# Patient Record
Sex: Male | Born: 1965 | Race: White | Hispanic: No | Marital: Single | Smoking: Current some day smoker
Health system: Southern US, Community
[De-identification: ages and names within clinical notes are randomized; demographics above are authoritative.]

## PROBLEM LIST (undated history)

## (undated) DIAGNOSIS — M5020 Other cervical disc displacement, unspecified cervical region: Secondary | ICD-10-CM

## (undated) DIAGNOSIS — R569 Unspecified convulsions: Secondary | ICD-10-CM

## (undated) DIAGNOSIS — I219 Acute myocardial infarction, unspecified: Secondary | ICD-10-CM

## (undated) DIAGNOSIS — M542 Cervicalgia: Secondary | ICD-10-CM

## (undated) DIAGNOSIS — J449 Chronic obstructive pulmonary disease, unspecified: Secondary | ICD-10-CM

## (undated) DIAGNOSIS — H9191 Unspecified hearing loss, right ear: Secondary | ICD-10-CM

## (undated) DIAGNOSIS — S0993XA Unspecified injury of face, initial encounter: Secondary | ICD-10-CM

## (undated) DIAGNOSIS — M1990 Unspecified osteoarthritis, unspecified site: Secondary | ICD-10-CM

## (undated) DIAGNOSIS — G8929 Other chronic pain: Secondary | ICD-10-CM

## (undated) DIAGNOSIS — F99 Mental disorder, not otherwise specified: Secondary | ICD-10-CM

## (undated) DIAGNOSIS — I639 Cerebral infarction, unspecified: Secondary | ICD-10-CM

## (undated) DIAGNOSIS — M75100 Unspecified rotator cuff tear or rupture of unspecified shoulder, not specified as traumatic: Secondary | ICD-10-CM

## (undated) DIAGNOSIS — I10 Essential (primary) hypertension: Secondary | ICD-10-CM

---

## 2001-08-15 ENCOUNTER — Emergency Department (HOSPITAL_COMMUNITY): Admission: EM | Admit: 2001-08-15 | Discharge: 2001-08-15 | Payer: Self-pay | Admitting: Emergency Medicine

## 2001-08-15 ENCOUNTER — Encounter: Payer: Self-pay | Admitting: Internal Medicine

## 2001-11-11 ENCOUNTER — Encounter: Payer: Self-pay | Admitting: Emergency Medicine

## 2001-11-11 ENCOUNTER — Emergency Department (HOSPITAL_COMMUNITY): Admission: EM | Admit: 2001-11-11 | Discharge: 2001-11-11 | Payer: Self-pay | Admitting: Emergency Medicine

## 2002-11-08 ENCOUNTER — Emergency Department (HOSPITAL_COMMUNITY): Admission: EM | Admit: 2002-11-08 | Discharge: 2002-11-08 | Payer: Self-pay | Admitting: Emergency Medicine

## 2003-11-28 ENCOUNTER — Emergency Department (HOSPITAL_COMMUNITY): Admission: EM | Admit: 2003-11-28 | Discharge: 2003-11-28 | Payer: Self-pay | Admitting: Emergency Medicine

## 2003-12-01 ENCOUNTER — Observation Stay (HOSPITAL_COMMUNITY): Admission: EM | Admit: 2003-12-01 | Discharge: 2003-12-01 | Payer: Self-pay | Admitting: Emergency Medicine

## 2007-10-16 ENCOUNTER — Emergency Department (HOSPITAL_COMMUNITY): Admission: EM | Admit: 2007-10-16 | Discharge: 2007-10-16 | Payer: Self-pay | Admitting: Emergency Medicine

## 2008-10-14 ENCOUNTER — Emergency Department (HOSPITAL_COMMUNITY): Admission: EM | Admit: 2008-10-14 | Discharge: 2008-10-14 | Payer: Self-pay | Admitting: Emergency Medicine

## 2009-02-10 ENCOUNTER — Emergency Department (HOSPITAL_COMMUNITY): Admission: EM | Admit: 2009-02-10 | Discharge: 2009-02-10 | Payer: Self-pay | Admitting: Emergency Medicine

## 2009-05-05 ENCOUNTER — Emergency Department (HOSPITAL_COMMUNITY): Admission: EM | Admit: 2009-05-05 | Discharge: 2009-05-05 | Payer: Self-pay | Admitting: Emergency Medicine

## 2009-05-10 ENCOUNTER — Emergency Department (HOSPITAL_COMMUNITY): Admission: EM | Admit: 2009-05-10 | Discharge: 2009-05-10 | Payer: Self-pay | Admitting: Emergency Medicine

## 2009-10-20 ENCOUNTER — Emergency Department (HOSPITAL_COMMUNITY): Admission: EM | Admit: 2009-10-20 | Discharge: 2009-10-20 | Payer: Self-pay | Admitting: Emergency Medicine

## 2009-12-02 ENCOUNTER — Ambulatory Visit (HOSPITAL_COMMUNITY): Admission: RE | Admit: 2009-12-02 | Discharge: 2009-12-02 | Payer: Self-pay | Admitting: Pulmonary Disease

## 2010-01-26 ENCOUNTER — Ambulatory Visit (HOSPITAL_COMMUNITY)
Admission: RE | Admit: 2010-01-26 | Discharge: 2010-01-26 | Payer: Self-pay | Source: Home / Self Care | Attending: Pulmonary Disease | Admitting: Pulmonary Disease

## 2010-02-04 ENCOUNTER — Emergency Department (HOSPITAL_COMMUNITY)
Admission: EM | Admit: 2010-02-04 | Discharge: 2010-02-04 | Payer: Self-pay | Source: Home / Self Care | Admitting: Emergency Medicine

## 2010-02-04 ENCOUNTER — Inpatient Hospital Stay (HOSPITAL_COMMUNITY): Admission: EM | Admit: 2010-02-04 | Discharge: 2010-02-05 | Payer: Self-pay | Source: Home / Self Care

## 2010-02-06 ENCOUNTER — Emergency Department (HOSPITAL_COMMUNITY)
Admission: EM | Admit: 2010-02-06 | Discharge: 2010-02-06 | Payer: Self-pay | Source: Home / Self Care | Admitting: Emergency Medicine

## 2010-03-05 ENCOUNTER — Encounter: Payer: Self-pay | Admitting: Pulmonary Disease

## 2010-03-06 ENCOUNTER — Encounter: Payer: Self-pay | Admitting: Pulmonary Disease

## 2010-04-24 LAB — URINALYSIS, ROUTINE W REFLEX MICROSCOPIC
Bilirubin Urine: NEGATIVE
Glucose, UA: NEGATIVE mg/dL
Ketones, ur: NEGATIVE mg/dL
Leukocytes, UA: NEGATIVE
Nitrite: NEGATIVE
Protein, ur: NEGATIVE mg/dL
Specific Gravity, Urine: 1.01 (ref 1.005–1.030)
Urobilinogen, UA: 0.2 mg/dL (ref 0.0–1.0)
pH: 5.5 (ref 5.0–8.0)

## 2010-04-24 LAB — RAPID URINE DRUG SCREEN, HOSP PERFORMED
Amphetamines: NOT DETECTED
Barbiturates: NOT DETECTED
Benzodiazepines: NOT DETECTED
Cocaine: NOT DETECTED
Opiates: NOT DETECTED
Tetrahydrocannabinol: NOT DETECTED

## 2010-04-24 LAB — BASIC METABOLIC PANEL
CO2: 23 mEq/L (ref 19–32)
Calcium: 8.5 mg/dL (ref 8.4–10.5)
Chloride: 104 mEq/L (ref 96–112)
GFR calc Af Amer: >60 mL/min (ref >60)
GFR calc Af Amer: >60 mL/min (ref >60)
GFR calc non Af Amer: >60 mL/min (ref >60)
GFR calc non Af Amer: >60 mL/min (ref >60)
Potassium: 4.1 mEq/L (ref 3.5–5.1)
Sodium: 141 mEq/L (ref 135–145)

## 2010-04-24 LAB — DIFFERENTIAL
Lymphocytes Relative: 24 % (ref 12–46)
Lymphs Abs: 2.5 10*3/uL (ref 0.7–4.0)
Neutro Abs: 7.9 10*3/uL — ABNORMAL HIGH (ref 1.7–7.7)
Neutrophils Relative %: 73 % (ref 43–77)

## 2010-04-24 LAB — CBC
HCT: 40.1 % (ref 39.0–52.0)
Hemoglobin: 15.3 g/dL (ref 13.0–17.0)
MCV: 90.9 fL (ref 78.0–100.0)
Platelets: 236 10*3/uL (ref 150–400)
RBC: 4.69 MIL/uL (ref 4.22–5.81)
RBC: 4.41 MIL/uL (ref 4.22–5.81)
WBC: 10.8 10*3/uL — ABNORMAL HIGH (ref 4.0–10.5)
WBC: 9.4 10*3/uL (ref 4.0–10.5)

## 2010-04-24 LAB — URINE MICROSCOPIC-ADD ON

## 2010-04-24 LAB — CEA: CEA: 1.1 ng/mL (ref 0.0–5.0)

## 2010-04-24 LAB — MRSA PCR SCREENING: MRSA by PCR: NEGATIVE

## 2010-05-15 LAB — CK TOTAL AND CKMB (NOT AT ARMC)
CK, MB: 0.9 ng/mL (ref 0.3–4.0)
Relative Index: INVALID (ref 0.0–2.5)
Total CK: 85 U/L (ref 7–232)

## 2010-05-15 LAB — DIFFERENTIAL
Eosinophils Absolute: 0.1 10*3/uL (ref 0.0–0.7)
Eosinophils Relative: 1 % (ref 0–5)
Lymphs Abs: 2.9 10*3/uL (ref 0.7–4.0)
Monocytes Absolute: 0.8 10*3/uL (ref 0.1–1.0)

## 2010-05-15 LAB — CBC
HCT: 48.3 % (ref 39.0–52.0)
Hemoglobin: 16.3 g/dL (ref 13.0–17.0)
MCV: 93.8 fL (ref 78.0–100.0)
Platelets: 219 10*3/uL (ref 150–400)
WBC: 9.6 10*3/uL (ref 4.0–10.5)

## 2010-05-15 LAB — TROPONIN I: Troponin I: 0.03 ng/mL (ref 0.00–0.06)

## 2010-05-15 LAB — BASIC METABOLIC PANEL
BUN: 9 mg/dL (ref 6–23)
Chloride: 103 mEq/L (ref 96–112)
Glucose, Bld: 137 mg/dL — ABNORMAL HIGH (ref 70–99)
Potassium: 4.1 mEq/L (ref 3.5–5.1)
Sodium: 135 mEq/L (ref 135–145)

## 2010-05-15 LAB — POCT CARDIAC MARKERS: Troponin i, poc: <0.05 ng/mL (ref 0.00–0.09)

## 2010-06-30 NOTE — H&P (Signed)
NAME:  [name], [name]                ACCOUNT NO.:  [id number]   MEDICAL RECORD NO.:  [id number]          PATIENT TYPE:  INP   LOCATION:  A220                          FACILITY:  [facility]   PHYSICIAN:  Kirk Ruths, M.D.DATE OF BIRTH:  1965-02-20   DATE OF ADMISSION:  12/01/2003  DATE OF DISCHARGE:  LH                                HISTORY & PHYSICAL   CHIEF COMPLAINT:  Syncope, chest pain.   HISTORY OF PRESENT ILLNESS:  This is a 45 year old male who was in the  parking lot at work today when he had a syncopal episode.  It seemed to last  a few seconds.  Patient states that he awoke and went on to work.  At work,  another syncopal episode.  Coworkers helped him lay on the floor.  This  lasted a few seconds.  Patient stated he did have some severe chest pain,  which he describes as sharp in pressure and tight.  Patient was treated with  nitroglycerin by EMS.  He had gradual relief of his chest pain.  The patient  has never had any previous syncopal spells before.  He does relate that he  occasionally has chest pain.  These are usually associated with stress and  anxiety.  Patient incidentally was seen in the emergency room this past  weekend and treated for kidney stones.  He states that he is having some  urethral discomfort and bladder discomfort related to his kidney stone but  has not taken any pain medications today.  The patient in the emergency room  was found to have a normal CT of the brain, normal EKG.  Cardiac enzymes  were negative.  CBC and MET-7 was significant only for potassium of 3.2.  Patient incidentally does relate some diaphoresis and feeling hot off and on  during the day.  The patient did not lose control of his bodily functions  during these episodes.  In the emergency room, when he was transferring to  the bed, he had a quasi-syncopal spell, described by one of the nurses as  eyes twittering.  The patient was admitted for telemetry to rule out MI and  a  neurological consult.   PAST MEDICAL HISTORY:  Patient has been here in the hospital once before for  a back injury several years ago.   He takes no medications regularly.   REVIEW OF SYSTEMS:  Patient relates some ongoing indigestion-type problems.  He states that he occasionally vomits in the mornings.  Patient has a  history of hyperlipidemia, untreated.   SOCIAL HISTORY:  He is a heavy smoker and drinks occasional beer.  He denies  substance abuse.   FAMILY HISTORY:  Significant for coronary artery disease with father having  a CABG.   PHYSICAL EXAMINATION:  VITAL SIGNS:  Afebrile.  Blood pressure 130/70, pulse  88 and regular, respirations 20 and unlabored.  GENERAL:  A well-developed white male in no severe distress.  HEENT:  TM's are normal.  Pupils are equal, round and reactive to light and  accommodation.  Oropharynx benign.  NECK:  Supple without  JVD, bruit, or thyromegaly.  LUNGS:  Clear in all areas.  HEART:  Regular sinus rhythm without murmur, rub or gallop.  ABDOMEN:  Soft and nontender.  EXTREMITIES:  Without clubbing, cyanosis or edema.  NEUROLOGIC:  Grossly intact.   ASSESSMENT:  1.  A syncopal episode.  2.  Chest pain.   PLAN:  Rule out MI, telemetry.  Neurological consult.     Will   WMM/MEDQ  D:  12/01/2003  T:  12/01/2003  Job:  [id number]

## 2010-06-30 NOTE — Discharge Summary (Signed)
NAME:  RIGLEY, NIESS                ACCOUNT NO.:  [id number]   MEDICAL RECORD NO.:  [id number]          PATIENT TYPE:  INP   LOCATION:  A220                          FACILITY:  [facility]   PHYSICIAN:  Kirk Ruths, M.D.DATE OF BIRTH:  1965/03/07   DATE OF ADMISSION:  12/01/2003  DATE OF DISCHARGE:  10/19/2005LH                                 DISCHARGE SUMMARY   DISCHARGE DIAGNOSES:  1.  Syncope.  2.  Chest pain.   HOSPITAL COURSE:  Refer to H&P.  The patient is a 45 year old who had a  syncopal episode at work today, less than a few seconds, and another  syncopal episode after inside work.  He was brought to the emergency room  where he also reported that he had some chest pain, sharp with pressure and  tight.  Responded to nitroglycerin.  The patient's workup in the emergency  room included CT scan of the brain, EKG, cardiac enzymes.  CBC and MET-7  were normal except potassium slightly low at 3.2.   The patient was admitted for rule out MI as well neurological evaluation for  syncope.  He was seen by me at which time we spoke of the gravity of  potential problems, it was inadvisable for him to be away off of the cardiac  monitor and ordered a nicotine patch. Shortly thereafter, the patient signed  out AMA.     Will   WMM/MEDQ  D:  12/21/2003  T:  12/21/2003  Job:  [id number]

## 2010-10-17 DIAGNOSIS — F1010 Alcohol abuse, uncomplicated: Secondary | ICD-10-CM | POA: Insufficient documentation

## 2010-10-17 DIAGNOSIS — F17200 Nicotine dependence, unspecified, uncomplicated: Secondary | ICD-10-CM | POA: Insufficient documentation

## 2010-10-17 DIAGNOSIS — J45909 Unspecified asthma, uncomplicated: Secondary | ICD-10-CM | POA: Insufficient documentation

## 2010-10-17 DIAGNOSIS — S060X0A Concussion without loss of consciousness, initial encounter: Secondary | ICD-10-CM | POA: Insufficient documentation

## 2010-10-17 DIAGNOSIS — S139XXA Sprain of joints and ligaments of unspecified parts of neck, initial encounter: Secondary | ICD-10-CM | POA: Insufficient documentation

## 2010-10-17 DIAGNOSIS — W010XXA Fall on same level from slipping, tripping and stumbling without subsequent striking against object, initial encounter: Secondary | ICD-10-CM | POA: Insufficient documentation

## 2010-10-17 DIAGNOSIS — R209 Unspecified disturbances of skin sensation: Secondary | ICD-10-CM | POA: Insufficient documentation

## 2010-10-17 DIAGNOSIS — IMO0002 Reserved for concepts with insufficient information to code with codable children: Secondary | ICD-10-CM | POA: Insufficient documentation

## 2010-10-17 NOTE — ED Notes (Signed)
Pt was out w. [employer], +etoh, became dizzy , tripped and fell, awoke some time later, now having neck pain, abrasion to chin.  +loc, unsure how long he was "out"

## 2010-10-18 ENCOUNTER — Emergency Department (HOSPITAL_COMMUNITY): Payer: Self-pay

## 2010-10-18 ENCOUNTER — Emergency Department (HOSPITAL_COMMUNITY)
Admission: EM | Admit: 2010-10-18 | Discharge: 2010-10-18 | Disposition: A | Discharge status: Home / Self Care | Payer: Self-pay | Attending: Emergency Medicine | Admitting: Emergency Medicine

## 2010-10-18 ENCOUNTER — Other Ambulatory Visit: Payer: Self-pay

## 2010-10-18 DIAGNOSIS — S060X9A Concussion with loss of consciousness of unspecified duration, initial encounter: Secondary | ICD-10-CM

## 2010-10-18 DIAGNOSIS — S161XXA Strain of muscle, fascia and tendon at neck level, initial encounter: Secondary | ICD-10-CM

## 2010-10-18 HISTORY — DX: Other cervical disc displacement, unspecified cervical region: M50.20

## 2010-10-18 HISTORY — DX: Mental disorder, not otherwise specified: F99

## 2010-10-18 HISTORY — DX: Cervicalgia: M54.2

## 2010-10-18 HISTORY — DX: Other chronic pain: G89.29

## 2010-10-18 HISTORY — DX: Unspecified injury of face, initial encounter: S09.93XA

## 2010-10-18 HISTORY — DX: Unspecified osteoarthritis, unspecified site: M19.90

## 2010-10-18 HISTORY — DX: Unspecified rotator cuff tear or rupture of unspecified shoulder, not specified as traumatic: M75.100

## 2010-10-18 LAB — PROTIME-INR
INR: 1.1 (ref 0.00–1.49)
Prothrombin Time: 14.4 seconds (ref 11.6–15.2)

## 2010-10-18 LAB — BASIC METABOLIC PANEL
Calcium: 8.8 mg/dL (ref 8.4–10.5)
GFR calc Af Amer: >60 mL/min (ref >60)
GFR calc non Af Amer: >60 mL/min (ref >60)
Potassium: 3.8 mEq/L (ref 3.5–5.1)
Sodium: 139 mEq/L (ref 135–145)

## 2010-10-18 LAB — CBC
MCH: 32.2 pg (ref 26.0–34.0)
MCHC: 35.7 g/dL (ref 30.0–36.0)
Platelets: 225 10*3/uL (ref 150–400)
RDW: 13.3 % (ref 11.5–15.5)

## 2010-10-18 LAB — ETHANOL: Alcohol, Ethyl (B): 183 mg/dL — ABNORMAL HIGH (ref 0–11)

## 2010-10-18 MED ORDER — MORPHINE SULFATE 4 MG/ML IJ SOLN
4.0000 mg | Freq: Once | INTRAMUSCULAR | Status: AC
  Administered 2010-10-18: 4 mg via INTRAVENOUS
  Filled 2010-10-18: qty 1

## 2010-10-18 MED ORDER — DIPHENHYDRAMINE HCL 50 MG/ML IJ SOLN
25.0000 mg | Freq: Once | INTRAMUSCULAR | Status: AC
  Administered 2010-10-18: 25 mg via INTRAVENOUS
  Filled 2010-10-18: qty 1
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
## 2010-10-18 NOTE — ED Provider Notes (Signed)
D/w NSurg rec discharge no NSurg f/u needed.  Hurman Horn, MD 10/19/10 [phone]

## 2010-10-18 NOTE — ED Notes (Signed)
Pt a/ox4. Resp even and unlabored. NAD at this time. Pt stable for d/c. D/C instructions reviewed with pt. Pt verbalized understanding. Pt refusing to remove neck brace. Pt instructed that He can use it for comfort or take it off. Pt ambulated with steady gate to POV with neck brace in place.

## 2010-10-18 NOTE — ED Notes (Signed)
Pt to MRI

## 2010-11-14 ENCOUNTER — Emergency Department (HOSPITAL_COMMUNITY): Payer: Self-pay

## 2010-11-14 ENCOUNTER — Encounter (HOSPITAL_COMMUNITY): Payer: Self-pay | Admitting: *Deleted

## 2010-11-14 ENCOUNTER — Emergency Department (HOSPITAL_COMMUNITY)
Admission: EM | Admit: 2010-11-14 | Discharge: 2010-11-14 | Disposition: A | Discharge status: Home / Self Care | Payer: Self-pay | Attending: Emergency Medicine | Admitting: Emergency Medicine

## 2010-11-14 DIAGNOSIS — F17200 Nicotine dependence, unspecified, uncomplicated: Secondary | ICD-10-CM | POA: Insufficient documentation

## 2010-11-14 DIAGNOSIS — W19XXXA Unspecified fall, initial encounter: Secondary | ICD-10-CM | POA: Insufficient documentation

## 2010-11-14 DIAGNOSIS — M549 Dorsalgia, unspecified: Secondary | ICD-10-CM | POA: Insufficient documentation

## 2010-11-14 DIAGNOSIS — Z79899 Other long term (current) drug therapy: Secondary | ICD-10-CM | POA: Insufficient documentation

## 2010-11-14 MED ORDER — HYDROMORPHONE HCL 1 MG/ML IJ SOLN
1.0000 mg | Freq: Once | INTRAMUSCULAR | Status: AC
  Administered 2010-11-14: 1 mg via INTRAVENOUS
  Filled 2010-11-14: qty 1

## 2010-11-14 MED ORDER — SODIUM CHLORIDE 0.9 % IJ SOLN
INTRAMUSCULAR | Status: AC
  Administered 2010-11-14: 3 mL
  Filled 2010-11-14: qty 3

## 2010-11-14 NOTE — ED Notes (Addendum)
Pt brought in by [facility] for c/o back pain; pt states he was outside working and got sob and started coughing so he went inside. He sat down on kitchen chair and then fell out on kitchen floor. Pt has extensive of back pain; pt states he has numerous ruptured disks; pt states he must have "passed out" because he only remembers waking up on the kitchen floor;

## 2010-11-14 NOTE — ED Provider Notes (Signed)
History     CSN: [id number] Arrival date & time: 11/14/2010  1:41 PM  Chief Complaint  Patient presents with  . Back Pain/LSB     (Consider location/radiation/quality/duration/timing/severity/associated sxs/prior treatment) Patient is a 45 y.o. male presenting with fall.  Fall The accident occurred 1 to 2 hours ago. The fall occurred while standing. He landed on a hard floor. The point of impact was the head. Pain location: Patient with worse pain in. The pain is at a severity of 6/10. The pain is severe. He was ambulatory at the scene. Pertinent negatives include no abdominal pain, no vomiting, no hematuria, no headaches and no hearing loss. The symptoms are aggravated by activity.    Past Medical History  Diagnosis Date  . Chronic neck pain   . Torn rotator cuff   . Ruptured disc, cervical   . Arthritis   . Mental disorder   . Asthma   . Injury of jawline     History reviewed. No pertinent past surgical history.  History reviewed. No pertinent family history.  History  Substance Use Topics  . Smoking status: Current Everyday Smoker    Types: Cigarettes  . Smokeless tobacco: Not on file  . Alcohol Use: Yes     occasionally      Review of Systems  Constitutional: Negative for fatigue.  HENT: Negative for congestion, sinus pressure and ear discharge.   Eyes: Negative for discharge.  Respiratory: Negative for cough.   Cardiovascular: Negative for chest pain.  Gastrointestinal: Negative for vomiting, abdominal pain and diarrhea.  Genitourinary: Negative for frequency and hematuria.  Musculoskeletal: Positive for back pain.  Skin: Negative for rash.  Neurological: Negative for seizures and headaches.  Hematological: Negative.   Psychiatric/Behavioral: Negative for hallucinations.    Allergies  Tylox; Oxycontin; and Percocet  Home Medications   Current Outpatient Rx  Name Route Sig Dispense Refill  . ALBUTEROL SULFATE HFA 108 (90 BASE) MCG/ACT IN AERS  Inhalation Inhale 2 puffs into the lungs every 6 (six) hours as needed. For shortness of breath    . HYDROCODONE-ACETAMINOPHEN 10-325 MG PO TABS Oral Take 1 tablet by mouth every 4 (four) hours as needed.      . IBUPROFEN 800 MG PO TABS Oral Take 800 mg by mouth every 8 (eight) hours as needed. For pain       BP 119/53  Pulse 81  Temp(Src) 97.8 F (36.6 C) (Oral)  Resp 18  SpO2 100%  Physical Exam  Constitutional: He is oriented to person, place, and time. He appears well-developed.  HENT:  Head: Normocephalic and atraumatic.  Eyes: Conjunctivae and EOM are normal. No scleral icterus.  Neck: Neck supple. No thyromegaly present.  Cardiovascular: Normal rate and regular rhythm.  Exam reveals no gallop and no friction rub.   No murmur heard. Pulmonary/Chest: No stridor. He has no wheezes. He has no rales. He exhibits no tenderness.  Abdominal: He exhibits no distension. There is no tenderness. There is no rebound.  Musculoskeletal: Normal range of motion. He exhibits no edema.       Patient tender in lumbar spine neuro exam normal extremities  Lymphadenopathy:    He has no cervical adenopathy.  Neurological: He is oriented to person, place, and time. Coordination normal.  Skin: No rash noted. No erythema.  Psychiatric: He has a normal mood and affect. His behavior is normal.    ED Course  Procedures (including critical care time)  Labs Reviewed - No data to display Ct Head  Wo Contrast  11/14/2010  *RADIOLOGY REPORT*  Clinical Data:  Ethanol intake, syncope, struck head and right side of jaw, loss of consciousness; right jaw and cervical pain  CT HEAD WITHOUT CONTRAST CT MAXILLOFACIAL WITHOUT CONTRAST CT CERVICAL SPINE WITHOUT CONTRAST  Technique:  Multidetector CT imaging of the head, cervical spine, and maxillofacial structures were performed using the standard protocol without intravenous contrast. Multiplanar CT image reconstructions of the cervical spine and maxillofacial  structures were also generated.  Right side of face marked with BB.  Comparison:  10/18/2010  CT HEAD  Findings: Minimal atrophy. Normal ventricular morphology. No midline shift or mass effect. Otherwise normal appearance of brain parenchyma. No intracranial hemorrhage, mass lesion, or evidence of acute infarction. No definite extra-axial fluid collections. Visualized paranasal sinuses and mastoid air cells grossly clear. Skull intact.  IMPRESSION: No acute intracranial abnormalities.  CT MAXILLOFACIAL  Findings: Visualized intracranial structures unremarkable. Intraorbital soft tissue planes clear. Question subcutaneous nodule anterior and inferior to the right ear, 9 x 7 mm, question sebaceous cyst or epidermal inclusion cyst. Few minimally prominent right cervical lymph nodes. Tiny mucosal retention cysts within left maxillary sinus. Minimal nasal septal deviation to the left. Calcified stylohyoid ligaments bilaterally. No facial bone or mandibular fracture identified. Periodontal disease left mandibular molar, tooth #19.  IMPRESSION: No acute facial bony abnormalities. Periodontal disease tooth #19.  CT CERVICAL SPINE  Findings: Visualized skull base intact.  Cervical vertebrae normal in height and alignment. Prevertebral soft tissues normal thickness. No acute fracture, subluxation or bone destruction. Deformity of spinous process of T1, question congenital versus sequela of remote trauma. No other focal bony abnormalities identified. Visualized lung apices clear.  IMPRESSION: No acute cervical spine abnormalities.  Original Report Authenticated By: Lollie Marrow, M.D.   Ct Cervical Spine Wo Contrast  11/14/2010  *RADIOLOGY REPORT*  Clinical Data:  Ethanol intake, syncope, struck head and right side of jaw, loss of consciousness; right jaw and cervical pain  CT HEAD WITHOUT CONTRAST CT MAXILLOFACIAL WITHOUT CONTRAST CT CERVICAL SPINE WITHOUT CONTRAST  Technique:  Multidetector CT imaging of the head, cervical  spine, and maxillofacial structures were performed using the standard protocol without intravenous contrast. Multiplanar CT image reconstructions of the cervical spine and maxillofacial structures were also generated.  Right side of face marked with BB.  Comparison:  10/18/2010  CT HEAD  Findings: Minimal atrophy. Normal ventricular morphology. No midline shift or mass effect. Otherwise normal appearance of brain parenchyma. No intracranial hemorrhage, mass lesion, or evidence of acute infarction. No definite extra-axial fluid collections. Visualized paranasal sinuses and mastoid air cells grossly clear. Skull intact.  IMPRESSION: No acute intracranial abnormalities.  CT MAXILLOFACIAL  Findings: Visualized intracranial structures unremarkable. Intraorbital soft tissue planes clear. Question subcutaneous nodule anterior and inferior to the right ear, 9 x 7 mm, question sebaceous cyst or epidermal inclusion cyst. Few minimally prominent right cervical lymph nodes. Tiny mucosal retention cysts within left maxillary sinus. Minimal nasal septal deviation to the left. Calcified stylohyoid ligaments bilaterally. No facial bone or mandibular fracture identified. Periodontal disease left mandibular molar, tooth #19.  IMPRESSION: No acute facial bony abnormalities. Periodontal disease tooth #19.  CT CERVICAL SPINE  Findings: Visualized skull base intact.  Cervical vertebrae normal in height and alignment. Prevertebral soft tissues normal thickness. No acute fracture, subluxation or bone destruction. Deformity of spinous process of T1, question congenital versus sequela of remote trauma. No other focal bony abnormalities identified. Visualized lung apices clear.  IMPRESSION: No acute cervical spine abnormalities.  Original Report Authenticated By: Lollie Marrow, M.D.   Ct Lumbar Spine Wo Contrast  11/14/2010  *RADIOLOGY REPORT*  Clinical Data: The patient reports drinking heavily today, passed out and hit head.  Now with low  back pain.  CT LUMBAR SPINE WITHOUT CONTRAST  Technique:  Multidetector CT imaging of the lumbar spine was performed without intravenous contrast administration. Multiplanar CT image reconstructions were also generated.  Comparison: 12/02/2009.  Findings: Chronic L1 compression fracture with greater than 50% loss of vertebral body height anteriorly.  Vacuum disc L1-L2.  No other compression fractures.  No visible  intraspinal hematoma. Mild lower lumbar facet arthropathy.  Mild stenosis at L4-L5 related to a shallow central protrusion.  Central protrusion at L5- S1 not clearly compressive.  Vascular calcification is nonaneurysmal.  No paraspinous hematoma.  Bilateral nephrolithiasis.  IMPRESSION: Chronic L1 compression fracture appears unchanged from MRI of 2011. 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  No visible acute lumbar spine pathology.  Bilateral nephrolithiasis.  Original Report Authenticated By: Elsie Stain, M.D.   Ct Maxillofacial Wo Cm  11/14/2010  *RADIOLOGY REPORT*  Clinical Data:  Ethanol intake, syncope, struck head and right side of jaw, loss of consciousness; right jaw and cervical pain  CT HEAD WITHOUT CONTRAST CT MAXILLOFACIAL WITHOUT CONTRAST CT CERVICAL SPINE WITHOUT CONTRAST  Technique:  Multidetector CT imaging of the head, cervical spine, and maxillofacial structures were performed using the standard protocol without intravenous contrast. Multiplanar CT image reconstructions of the cervical spine and maxillofacial structures were also generated.  Right side of face marked with BB.  Comparison:  10/18/2010  CT HEAD  Findings: Minimal atrophy. Normal ventricular morphology. No midline shift or mass effect. Otherwise normal appearance of brain parenchyma. No intracranial hemorrhage, mass lesion, or evidence of acute infarction. No definite extra-axial fluid collections. Visualized paranasal sinuses and mastoid air cells  grossly clear. Skull intact.  IMPRESSION: No acute intracranial abnormalities.  CT MAXILLOFACIAL  Findings: Visualized intracranial structures unremarkable. Intraorbital soft tissue planes clear. Question subcutaneous nodule anterior and inferior to the right ear, 9 x 7 mm, question sebaceous cyst or epidermal inclusion cyst. Few minimally prominent right cervical lymph nodes. Tiny mucosal retention cysts within left maxillary sinus. Minimal nasal septal deviation to the left. Calcified stylohyoid ligaments bilaterally. No facial bone or mandibular fracture identified. Periodontal disease left mandibular molar, tooth #19.  IMPRESSION: No acute facial bony abnormalities. Periodontal disease tooth #19.  CT CERVICAL SPINE  Findings: Visualized skull base intact.  Cervical vertebrae normal in height and alignment. Prevertebral soft tissues normal thickness. No acute fracture, subluxation or bone destruction. Deformity of spinous process of T1, question congenital versus sequela of remote trauma. No other focal bony abnormalities identified. Visualized lung apices clear.  IMPRESSION: No acute cervical spine abnormalities.  Original Report Authenticated By: Lollie Marrow, M.D.     1. Fall   2. Back pain       MDM  Fall back pain,  Exacerbation of chronic back pain        Benny Lennert, MD 11/14/10 1911

## 2011-01-02 ENCOUNTER — Ambulatory Visit (HOSPITAL_COMMUNITY)
Admission: RE | Admit: 2011-01-02 | Discharge: 2011-01-02 | Disposition: A | Discharge status: Home / Self Care | Payer: Self-pay | Source: Ambulatory Visit | Attending: Pulmonary Disease | Admitting: Pulmonary Disease

## 2011-01-02 DIAGNOSIS — M25519 Pain in unspecified shoulder: Secondary | ICD-10-CM | POA: Insufficient documentation

## 2011-01-02 DIAGNOSIS — M545 Low back pain, unspecified: Secondary | ICD-10-CM | POA: Insufficient documentation

## 2011-01-02 DIAGNOSIS — M6281 Muscle weakness (generalized): Secondary | ICD-10-CM | POA: Insufficient documentation

## 2011-01-02 DIAGNOSIS — IMO0001 Reserved for inherently not codable concepts without codable children: Secondary | ICD-10-CM | POA: Insufficient documentation

## 2011-01-02 DIAGNOSIS — M79609 Pain in unspecified limb: Secondary | ICD-10-CM | POA: Insufficient documentation

## 2011-01-19 ENCOUNTER — Emergency Department (HOSPITAL_COMMUNITY): Payer: Self-pay

## 2011-01-19 ENCOUNTER — Emergency Department (HOSPITAL_COMMUNITY)
Admission: EM | Admit: 2011-01-19 | Discharge: 2011-01-20 | Disposition: A | Discharge status: Home / Self Care | Payer: Self-pay | Attending: Emergency Medicine | Admitting: Emergency Medicine

## 2011-01-19 ENCOUNTER — Encounter (HOSPITAL_COMMUNITY): Payer: Self-pay

## 2011-01-19 DIAGNOSIS — M5416 Radiculopathy, lumbar region: Secondary | ICD-10-CM

## 2011-01-19 DIAGNOSIS — M129 Arthropathy, unspecified: Secondary | ICD-10-CM | POA: Insufficient documentation

## 2011-01-19 DIAGNOSIS — M5020 Other cervical disc displacement, unspecified cervical region: Secondary | ICD-10-CM | POA: Insufficient documentation

## 2011-01-19 DIAGNOSIS — M542 Cervicalgia: Secondary | ICD-10-CM | POA: Insufficient documentation

## 2011-01-19 DIAGNOSIS — F17200 Nicotine dependence, unspecified, uncomplicated: Secondary | ICD-10-CM | POA: Insufficient documentation

## 2011-01-19 DIAGNOSIS — J45909 Unspecified asthma, uncomplicated: Secondary | ICD-10-CM | POA: Insufficient documentation

## 2011-01-19 DIAGNOSIS — G8929 Other chronic pain: Secondary | ICD-10-CM | POA: Insufficient documentation

## 2011-01-19 DIAGNOSIS — IMO0002 Reserved for concepts with insufficient information to code with codable children: Secondary | ICD-10-CM | POA: Insufficient documentation

## 2011-01-19 HISTORY — DX: Chronic obstructive pulmonary disease, unspecified: J44.9

## 2011-01-19 MED ORDER — DEXAMETHASONE SODIUM PHOSPHATE 4 MG/ML IJ SOLN
8.0000 mg | Freq: Once | INTRAMUSCULAR | Status: AC
  Administered 2011-01-19: 8 mg via INTRAMUSCULAR
  Filled 2011-01-19: qty 2

## 2011-01-19 MED ORDER — METHOCARBAMOL 500 MG PO TABS
1000.0000 mg | ORAL_TABLET | Freq: Once | ORAL | Status: AC
  Administered 2011-01-19: 1000 mg via ORAL
  Filled 2011-01-19: qty 2

## 2011-01-19 MED ORDER — FENTANYL CITRATE 0.05 MG/ML IJ SOLN
50.0000 ug | Freq: Once | INTRAMUSCULAR | Status: AC
  Administered 2011-01-19: 50 ug via INTRAMUSCULAR
  Filled 2011-01-19: qty 2

## 2011-01-19 MED ORDER — KETOROLAC TROMETHAMINE 60 MG/2ML IM SOLN
60.0000 mg | Freq: Once | INTRAMUSCULAR | Status: AC
  Administered 2011-01-19: 60 mg via INTRAMUSCULAR
  Filled 2011-01-19: qty 2

## 2011-01-19 NOTE — ED Notes (Signed)
C/o back pain , has history of same, pain meds not working

## 2011-01-19 NOTE — ED Notes (Signed)
Needs 5 back surgeries, but back "went out last night", has been "popping my pain pills , and there not helping", last dose 2 lortab at 8pm

## 2011-01-19 NOTE — ED Provider Notes (Signed)
History     CSN: [id number] Arrival date & time: 01/19/2011  9:54 PM   First MD Initiated Contact with Patient 01/19/11 2259      Chief Complaint  Patient presents with  . Back Pain    (Consider location/radiation/quality/duration/timing/severity/associated sxs/prior treatment) Patient is a 45 y.o. male presenting with back pain. The history is provided by the patient. No language interpreter was used.  Back Pain  This is a recurrent problem. The current episode started yesterday. The problem occurs constantly. The problem has been gradually worsening. The pain is associated with no known injury. The pain is present in the lumbar spine. The quality of the pain is described as stabbing. The pain is at a severity of 10/10. The pain is severe. The symptoms are aggravated by bending, twisting and certain positions. The pain is the same all the time. Stiffness is present all day. Pertinent negatives include no chest pain, no fever, no numbness, no weight loss, no headaches, no abdominal pain, no abdominal swelling, no bowel incontinence, no perianal numbness, no bladder incontinence, no dysuria, no pelvic pain, no leg pain, no paresthesias, no paresis, no tingling and no weakness. He has tried analgesics for the symptoms. The treatment provided mild relief. Risk factors include lack of exercise.    Past Medical History  Diagnosis Date  . Chronic neck pain   . Torn rotator cuff   . Ruptured disc, cervical   . Arthritis   . Mental disorder   . Asthma   . Injury of jawline   . COPD (chronic obstructive pulmonary disease)     History reviewed. No pertinent past surgical history.  No family history on file.  History  Substance Use Topics  . Smoking status: Current Everyday Smoker    Types: Cigarettes  . Smokeless tobacco: Not on file  . Alcohol Use: Yes     occasionally      Review of Systems  Constitutional: Negative for fever and weight loss.  HENT: Negative for facial  swelling.   Eyes: Negative for discharge.  Respiratory: Negative for apnea and chest tightness.   Cardiovascular: Negative for chest pain.  Gastrointestinal: Negative for abdominal pain, abdominal distention and bowel incontinence.  Genitourinary: Negative for bladder incontinence, dysuria, urgency, frequency, flank pain, difficulty urinating and pelvic pain.  Musculoskeletal: Positive for back pain. Negative for joint swelling and arthralgias.  Neurological: Negative for tingling, weakness, numbness, headaches and paresthesias.  Hematological: Negative.   Psychiatric/Behavioral: Negative.     Allergies  Tylox; Oxycontin; and Percocet  Home Medications   Current Outpatient Rx  Name Route Sig Dispense Refill  . ALBUTEROL SULFATE HFA 108 (90 BASE) MCG/ACT IN AERS Inhalation Inhale 2 puffs into the lungs every 6 (six) hours as needed. For shortness of breath    . HYDROCODONE-ACETAMINOPHEN 10-325 MG PO TABS Oral Take 1 tablet by mouth every 4 (four) hours as needed. For pain    . IBUPROFEN 800 MG PO TABS Oral Take 800 mg by mouth every 8 (eight) hours as needed. For pain     . ONE-A-DAY MENS PO TABS Oral Take 1 tablet by mouth daily.        BP 119/73  Pulse 88  Temp(Src) 97.3 F (36.3 C) (Oral)  Resp 18  Ht 6\' 1"  (1.854 m)  Wt 240 lb (108.863 kg)  BMI 31.66 kg/m2  SpO2 100%  Physical Exam  Constitutional: He is oriented to person, place, and time. He appears well-developed and well-nourished.  HENT:  Head:  Normocephalic and atraumatic.  Eyes: Conjunctivae and EOM are normal. Pupils are equal, round, and reactive to light.  Neck: Normal range of motion. Neck supple. No tracheal deviation present.  Cardiovascular: Normal rate and regular rhythm.   Pulmonary/Chest: Effort normal and breath sounds normal. He has no wheezes.  Abdominal: Soft. Bowel sounds are normal. There is no tenderness. There is no rebound and no guarding.  Musculoskeletal: Normal range of motion.    Neurological: He is alert and oriented to person, place, and time. He displays normal reflexes. Coordination normal.       Intact L5/s1 intact perineal sensation 2+ knee reflexes and ankle reflexes, intact sensation to BLE 2+ dorsalis pedis B  Skin: Skin is warm and dry. He is not diaphoretic.  Psychiatric: Thought content normal.    ED Course  Procedures (including critical care time)  Labs Reviewed - No data to display No results found.   No diagnosis found.    MDM  Follow up with your family doctor and neurosurgery.  Take all medications as prescribed and return for weakness numbness bowel or bladder incontinence or any concerning symptoms.  Patient verbalizes understanding and agrees to follow up        Roselee Tayloe Smitty Cords, MD 01/20/11 4540

## 2011-01-20 MED ORDER — HYDROCODONE-ACETAMINOPHEN 5-500 MG PO TABS: 1.0000 | ORAL_TABLET | Freq: Four times a day (QID) | ORAL | Status: AC | PRN

## 2011-01-20 MED ORDER — METHOCARBAMOL 750 MG PO TABS: ORAL_TABLET | ORAL | Status: DC

## 2011-01-20 MED ORDER — NAPROXEN 375 MG PO TABS: 375.0000 mg | ORAL_TABLET | Freq: Two times a day (BID) | ORAL | Status: AC

## 2011-05-26 ENCOUNTER — Encounter (HOSPITAL_COMMUNITY): Payer: Self-pay | Admitting: *Deleted

## 2011-05-26 ENCOUNTER — Emergency Department (HOSPITAL_COMMUNITY): Payer: Self-pay

## 2011-05-26 ENCOUNTER — Emergency Department (HOSPITAL_COMMUNITY)
Admission: EM | Admit: 2011-05-26 | Discharge: 2011-05-27 | Payer: Self-pay | Attending: Emergency Medicine | Admitting: Emergency Medicine

## 2011-05-26 DIAGNOSIS — J4489 Other specified chronic obstructive pulmonary disease: Secondary | ICD-10-CM | POA: Insufficient documentation

## 2011-05-26 DIAGNOSIS — W19XXXA Unspecified fall, initial encounter: Secondary | ICD-10-CM

## 2011-05-26 DIAGNOSIS — T148XXA Other injury of unspecified body region, initial encounter: Secondary | ICD-10-CM

## 2011-05-26 DIAGNOSIS — S0083XA Contusion of other part of head, initial encounter: Secondary | ICD-10-CM | POA: Insufficient documentation

## 2011-05-26 DIAGNOSIS — Z8739 Personal history of other diseases of the musculoskeletal system and connective tissue: Secondary | ICD-10-CM | POA: Insufficient documentation

## 2011-05-26 DIAGNOSIS — G8929 Other chronic pain: Secondary | ICD-10-CM | POA: Insufficient documentation

## 2011-05-26 DIAGNOSIS — J449 Chronic obstructive pulmonary disease, unspecified: Secondary | ICD-10-CM | POA: Insufficient documentation

## 2011-05-26 DIAGNOSIS — W108XXA Fall (on) (from) other stairs and steps, initial encounter: Secondary | ICD-10-CM | POA: Insufficient documentation

## 2011-05-26 DIAGNOSIS — R51 Headache: Secondary | ICD-10-CM | POA: Insufficient documentation

## 2011-05-26 DIAGNOSIS — S0003XA Contusion of scalp, initial encounter: Secondary | ICD-10-CM | POA: Insufficient documentation

## 2011-05-26 DIAGNOSIS — M542 Cervicalgia: Secondary | ICD-10-CM | POA: Insufficient documentation

## 2011-05-26 MED ORDER — CYCLOBENZAPRINE HCL 10 MG PO TABS
10.0000 mg | ORAL_TABLET | Freq: Once | ORAL | Status: AC
  Administered 2011-05-27: 10 mg via ORAL
  Filled 2011-05-26: qty 1

## 2011-05-26 MED ORDER — KETOROLAC TROMETHAMINE 60 MG/2ML IM SOLN
60.0000 mg | Freq: Once | INTRAMUSCULAR | Status: AC
  Administered 2011-05-27: 60 mg via INTRAMUSCULAR
  Filled 2011-05-26: qty 2

## 2011-05-26 NOTE — ED Notes (Signed)
Reported that pt passed out due to asthma attack and fell on 8 steps

## 2011-05-26 NOTE — ED Provider Notes (Signed)
History   This chart was scribed for Sunnie Nielsen, MD by Sofie Rower. The patient was seen in room APA08/APA08 and the patient's care was started at 11:28 PM     CSN: [id number]  Arrival date & time 05/26/11  2244   First MD Initiated Contact with Patient 05/26/11 2300      Chief Complaint  Patient presents with  . Fall  . Loss of Consciousness    (Consider location/radiation/quality/duration/timing/severity/associated sxs/prior treatment) HPI  [name] is a 46 y.o. male who presents to the Emergency Department complaining of moderate, episodic fall onset today with associated symptoms of back pain located in the lower back. Pt states "he fell down some stairs after passing out due to an asthma attack," Pt informs EDP that "the guard and nurse saw him fall down 8 steps." Modifying factors include taking an unknown narcotic medication which provides moderate relief. Pt has a hx of chronic neck pain, LOC due to asthma, neck pain, back pain, ruptured L2,L3 discs.    PCP is Dr. Juanetta Gosling  Patient denies any difficulty breathing or shortness of breath at this time. He denies any other pain injury or trauma. Denies any headache at this time. No bleeding. No lacerations or abrasions. No injury to extremities. He denies any weakness or numbness in his lower extremities.  Past Medical History  Diagnosis Date  . Chronic neck pain   . Torn rotator cuff   . Ruptured disc, cervical   . Arthritis   . Mental disorder   . Asthma   . Injury of jawline   . COPD (chronic obstructive pulmonary disease)       History  Substance Use Topics  . Smoking status: Current Everyday Smoker    Types: Cigarettes  . Smokeless tobacco: Not on file  . Alcohol Use: Yes     occasionally      Review of Systems  All other systems reviewed and are negative.    10 Systems reviewed and all are negative for acute change except as noted in the HPI.    Allergies  Tylox; Oxycontin; and  Percocet  Home Medications   Current Outpatient Rx  Name Route Sig Dispense Refill  . ALBUTEROL SULFATE HFA 108 (90 BASE) MCG/ACT IN AERS Inhalation Inhale 2 puffs into the lungs every 6 (six) hours as needed. For shortness of breath    . HYDROCODONE-ACETAMINOPHEN 10-325 MG PO TABS Oral Take 1 tablet by mouth every 4 (four) hours as needed. For pain    . IBUPROFEN 800 MG PO TABS Oral Take 800 mg by mouth every 8 (eight) hours as needed. For pain     . METHOCARBAMOL 750 MG PO TABS  1 tab po BID x 10 days 20 tablet 0  . ONE-A-DAY MENS PO TABS Oral Take 1 tablet by mouth daily.      Marland Kitchen NAPROXEN 375 MG PO TABS Oral Take 1 tablet (375 mg total) by mouth 2 (two) times daily. 20 tablet 0    BP 128/95  Pulse 70  Temp(Src) 97.1 F (36.2 C) (Oral)  Resp 20  Ht 6\' 1"  (1.854 m)  Wt 240 lb (108.863 kg)  BMI 31.66 kg/m2  SpO2 96%  Physical Exam  Nursing note and vitals reviewed. Constitutional: He is oriented to person, place, and time. He appears well-developed and well-nourished.  HENT:  Head: Normocephalic and atraumatic.  Nose: Nose normal.  Mouth/Throat: Oropharynx is clear and moist.       No trismus. Pt  in c-collar, no deformities.   Eyes: EOM are normal. Pupils are equal, round, and reactive to light.  Cardiovascular: Normal rate, regular rhythm and normal heart sounds.  Exam reveals no gallop and no friction rub.   No murmur heard. Pulmonary/Chest: Effort normal and breath sounds normal. No respiratory distress. He has no wheezes.  Abdominal: Soft. There is no tenderness. There is no rebound.  Musculoskeletal: He exhibits tenderness (Tenderness located in the lower back. No thoracic tenderness or deformity. ).  Neurological: He is alert and oriented to person, place, and time. He has normal reflexes.  Skin: Skin is warm and dry.       Small abrasion over the dorsum of left foot.   Psychiatric: He has a normal mood and affect. His behavior is normal.    ED Course  Procedures  (including critical care time)  DIAGNOSTIC STUDIES: Oxygen Saturation is 96% on room air, adequate by my interpretation.    COORDINATION OF CARE:  Results for orders placed during the hospital encounter of 05/26/11  CBC      Component Value Range   WBC 6.8  4.0 - 10.5 (K/uL)   RBC 4.54  4.22 - 5.81 (MIL/uL)   Hemoglobin 13.8  13.0 - 17.0 (g/dL)   HCT 40.9  81.1 - 91.4 (%)   MCV 86.8  78.0 - 100.0 (fL)   MCH 30.4  26.0 - 34.0 (pg)   MCHC 35.0  30.0 - 36.0 (g/dL)   RDW 78.2  95.6 - 21.3 (%)   Platelets 197  150 - 400 (K/uL)  BASIC METABOLIC PANEL      Component Value Range   Sodium 136  135 - 145 (mEq/L)   Potassium 3.8  3.5 - 5.1 (mEq/L)   Chloride 102  96 - 112 (mEq/L)   CO2 26  19 - 32 (mEq/L)   Glucose, Bld 95  70 - 99 (mg/dL)   BUN 10  6 - 23 (mg/dL)   Creatinine, Ser 0.86  0.50 - 1.35 (mg/dL)   Calcium 9.2  8.4 - 57.8 (mg/dL)   GFR calc non Af Amer >90  >90 (mL/min)   GFR calc Af Amer >90  >90 (mL/min)    Results for orders placed during the hospital encounter of 05/26/11  CBC      Component Value Range   WBC 6.8  4.0 - 10.5 (K/uL)   RBC 4.54  4.22 - 5.81 (MIL/uL)   Hemoglobin 13.8  13.0 - 17.0 (g/dL)   HCT 46.9  62.9 - 52.8 (%)   MCV 86.8  78.0 - 100.0 (fL)   MCH 30.4  26.0 - 34.0 (pg)   MCHC 35.0  30.0 - 36.0 (g/dL)   RDW 41.3  24.4 - 01.0 (%)   Platelets 197  150 - 400 (K/uL)  BASIC METABOLIC PANEL      Component Value Range   Sodium 136  135 - 145 (mEq/L)   Potassium 3.8  3.5 - 5.1 (mEq/L)   Chloride 102  96 - 112 (mEq/L)   CO2 26  19 - 32 (mEq/L)   Glucose, Bld 95  70 - 99 (mg/dL)   BUN 10  6 - 23 (mg/dL)   Creatinine, Ser 2.72  0.50 - 1.35 (mg/dL)   Calcium 9.2  8.4 - 53.6 (mg/dL)   GFR calc non Af Amer >90  >90 (mL/min)   GFR calc Af Amer >90  >90 (mL/min)   Results for orders placed during the hospital encounter of 05/26/11  CBC      Component Value Range   WBC 6.8  4.0 - 10.5 (K/uL)   RBC 4.54  4.22 - 5.81 (MIL/uL)   Hemoglobin 13.8  13.0 -  17.0 (g/dL)   HCT 16.1  09.6 - 04.5 (%)   MCV 86.8  78.0 - 100.0 (fL)   MCH 30.4  26.0 - 34.0 (pg)   MCHC 35.0  30.0 - 36.0 (g/dL)   RDW 40.9  81.1 - 91.4 (%)   Platelets 197  150 - 400 (K/uL)  BASIC METABOLIC PANEL      Component Value Range   Sodium 136  135 - 145 (mEq/L)   Potassium 3.8  3.5 - 5.1 (mEq/L)   Chloride 102  96 - 112 (mEq/L)   CO2 26  19 - 32 (mEq/L)   Glucose, Bld 95  70 - 99 (mg/dL)   BUN 10  6 - 23 (mg/dL)   Creatinine, Ser 7.82  0.50 - 1.35 (mg/dL)   Calcium 9.2  8.4 - 95.6 (mg/dL)   GFR calc non Af Amer >90  >90 (mL/min)   GFR calc Af Amer >90  >90 (mL/min)   Ct Head Wo Contrast  05/27/2011  *RADIOLOGY REPORT*  Clinical Data:  Fall.  Headache.  Neck pain.  CT HEAD WITHOUT CONTRAST CT CERVICAL SPINE WITHOUT CONTRAST  Technique:  Multidetector CT imaging of the head and cervical spine was performed following the standard protocol without intravenous contrast.  Multiplanar CT image reconstructions of the cervical spine were also generated.  Comparison:  11/14/2010  CT HEAD  Findings: There is no evidence of intracranial hemorrhage, brain edema or other signs of acute infarction.  There is no evidence of intracranial mass lesion or mass effect.  No abnormal extra-axial fluid collections are identified.  Ventricles are normal in size.  No other intracranial abnormality identified.  No evidence of skull fracture.  IMPRESSION: Negative noncontrast head CT.  CT CERVICAL SPINE  Findings: No evidence of cervical spine fracture or subluxation. Intervertebral disc spaces are maintained.  No evidence of facet arthropathy or other significant bone abnormality.  IMPRESSION: Negative.  No evidence of cervical spine fracture or subluxation.  Original Report Authenticated By: Danae Orleans, M.D.   Ct Cervical Spine Wo Contrast  05/27/2011  *RADIOLOGY REPORT*  Clinical Data:  Fall.  Headache.  Neck pain.  CT HEAD WITHOUT CONTRAST CT CERVICAL SPINE WITHOUT CONTRAST  Technique:  Multidetector  CT imaging of the head and cervical spine was performed following the standard protocol without intravenous contrast.  Multiplanar CT image reconstructions of the cervical spine were also generated.  Comparison:  11/14/2010  CT HEAD  Findings: There is no evidence of intracranial hemorrhage, brain edema or other signs of acute infarction.  There is no evidence of intracranial mass lesion or mass effect.  No abnormal extra-axial fluid collections are identified.  Ventricles are normal in size.  No other intracranial abnormality identified.  No evidence of skull fracture.  IMPRESSION: Negative noncontrast head CT.  CT CERVICAL SPINE  Findings: No evidence of cervical spine fracture or subluxation. Intervertebral disc spaces are maintained.  No evidence of facet arthropathy or other significant bone abnormality.  IMPRESSION: Negative.  No evidence of cervical spine fracture or subluxation.  Original Report Authenticated By: Danae Orleans, M.D.    L-spine xrays obtained and reviewed with Dr Eppie Gibson, radiologist. Old L1 FX no acute injury.   11:37PM- EDP at bedside discusses treatment plan.   MDM   Back pain  after alleged fall. Presents to the custody of authorities, as he is currently in jail.  Questionable fall and certainly no asthma symptoms or pulmonary findings to suggest the same at this time. Imaging obtained and reviewed as above. Pain medications provided and on recheck at 5 AM is sleeping comfortably without any distress. No deficits on exam.      I personally performed the services described in this documentation, which was scribed in my presence. The recorded information has been reviewed and considered.     Sunnie Nielsen, MD 05/27/11 [phone]

## 2011-05-27 LAB — BASIC METABOLIC PANEL
BUN: 10 mg/dL (ref 6–23)
CO2: 26 mEq/L (ref 19–32)
Calcium: 9.2 mg/dL (ref 8.4–10.5)
Glucose, Bld: 95 mg/dL (ref 70–99)
Sodium: 136 mEq/L (ref 135–145)

## 2011-05-27 LAB — CBC
HCT: 39.4 % (ref 39.0–52.0)
Hemoglobin: 13.8 g/dL (ref 13.0–17.0)
MCH: 30.4 pg (ref 26.0–34.0)
MCV: 86.8 fL (ref 78.0–100.0)
RBC: 4.54 MIL/uL (ref 4.22–5.81)

## 2011-05-27 NOTE — ED Notes (Signed)
BMP run by lab - I-stat tube not drawn initially when CBC drawn - order completed by lab.

## 2011-05-27 NOTE — Discharge Instructions (Signed)

## 2011-05-27 NOTE — ED Notes (Signed)
Patient requested additional medication for pain.  Dr Dierdre Highman informed. No new orders.  Up to bathroom. D/C instructions to patient and Deputy.  DC in care of [facility] to return to detention

## 2015-02-13 ENCOUNTER — Encounter (HOSPITAL_COMMUNITY): Payer: Self-pay | Admitting: Emergency Medicine

## 2015-02-13 ENCOUNTER — Emergency Department (HOSPITAL_COMMUNITY): Payer: Self-pay

## 2015-02-13 ENCOUNTER — Emergency Department (HOSPITAL_COMMUNITY)
Admission: EM | Admit: 2015-02-13 | Discharge: 2015-02-13 | Disposition: A | Discharge status: Home / Self Care | Payer: Self-pay | Attending: Emergency Medicine | Admitting: Emergency Medicine

## 2015-02-13 DIAGNOSIS — S43102A Unspecified dislocation of left acromioclavicular joint, initial encounter: Secondary | ICD-10-CM | POA: Insufficient documentation

## 2015-02-13 DIAGNOSIS — M1990 Unspecified osteoarthritis, unspecified site: Secondary | ICD-10-CM | POA: Insufficient documentation

## 2015-02-13 DIAGNOSIS — G8929 Other chronic pain: Secondary | ICD-10-CM | POA: Insufficient documentation

## 2015-02-13 DIAGNOSIS — Y9289 Other specified places as the place of occurrence of the external cause: Secondary | ICD-10-CM | POA: Insufficient documentation

## 2015-02-13 DIAGNOSIS — S8992XA Unspecified injury of left lower leg, initial encounter: Secondary | ICD-10-CM | POA: Insufficient documentation

## 2015-02-13 DIAGNOSIS — S3992XA Unspecified injury of lower back, initial encounter: Secondary | ICD-10-CM | POA: Insufficient documentation

## 2015-02-13 DIAGNOSIS — Z8659 Personal history of other mental and behavioral disorders: Secondary | ICD-10-CM | POA: Insufficient documentation

## 2015-02-13 DIAGNOSIS — Z79899 Other long term (current) drug therapy: Secondary | ICD-10-CM | POA: Insufficient documentation

## 2015-02-13 DIAGNOSIS — Y9389 Activity, other specified: Secondary | ICD-10-CM | POA: Insufficient documentation

## 2015-02-13 DIAGNOSIS — Z87891 Personal history of nicotine dependence: Secondary | ICD-10-CM | POA: Insufficient documentation

## 2015-02-13 DIAGNOSIS — J449 Chronic obstructive pulmonary disease, unspecified: Secondary | ICD-10-CM | POA: Insufficient documentation

## 2015-02-13 DIAGNOSIS — W108XXA Fall (on) (from) other stairs and steps, initial encounter: Secondary | ICD-10-CM | POA: Insufficient documentation

## 2015-02-13 DIAGNOSIS — Y998 Other external cause status: Secondary | ICD-10-CM | POA: Insufficient documentation

## 2015-02-13 HISTORY — DX: Unspecified convulsions: R56.9

## 2015-02-13 MED ORDER — HYDROCODONE-ACETAMINOPHEN 10-325 MG PO TABS: 1.0000 | ORAL_TABLET | ORAL | Status: DC | PRN

## 2015-02-13 MED ORDER — HYDROMORPHONE HCL 2 MG/ML IJ SOLN
2.0000 mg | Freq: Once | INTRAMUSCULAR | Status: AC
  Administered 2015-02-13: 2 mg via INTRAMUSCULAR
  Filled 2015-02-13: qty 1

## 2015-02-13 MED ORDER — DIPHENHYDRAMINE HCL 50 MG/ML IJ SOLN
25.0000 mg | Freq: Once | INTRAMUSCULAR | Status: AC
  Administered 2015-02-13: 25 mg via INTRAMUSCULAR
  Filled 2015-02-13: qty 1

## 2015-02-13 NOTE — ED Notes (Signed)
Shoulder immobilizer applied .

## 2015-02-13 NOTE — ED Notes (Signed)
Pt states legs gave out on him and he fell this am.  Landed on Left shoulder and left knee. Patient feels it is dislocated. ROM limited. Swelling noted to superior left shoulder.mild dislocation noted. Radial pulse wnl. No markings on knee or swelling but c/o pain to lateral left knee.

## 2015-02-13 NOTE — ED Provider Notes (Addendum)
CSN: [id number]     Arrival date & time 02/13/15  7829 History   First MD Initiated Contact with Patient 02/13/15 [phone]     Chief Complaint  Patient presents with  . Shoulder Pain  . Knee Pain     (Consider location/radiation/quality/duration/timing/severity/associated sxs/prior Treatment) Patient is a 49 y.o. male presenting with shoulder pain and knee pain. The history is provided by the patient. No language interpreter was used.  Shoulder Pain Location:  Clavicle Time since incident:  1 day Injury: yes   Mechanism of injury: fall   Fall:    Fall occurred:  Down stairs   Impact surface:  Stairs Clavicle location:  L clavicle Pain details:    Quality:  Aching   Severity:  Moderate   Onset quality:  Gradual   Duration:  2 hours   Timing:  Constant   Progression:  Worsening Chronicity:  New Dislocation: no   Prior injury to area:  No Relieved by:  Nothing Worsened by:  Movement Ineffective treatments:  None tried Associated symptoms: back pain   Knee Pain Associated symptoms: back pain   Pt reports   Past Medical History  Diagnosis Date  . Chronic neck pain   . Torn rotator cuff   . Ruptured disc, cervical   . Arthritis   . Mental disorder   . Asthma   . Injury of jawline   . COPD (chronic obstructive pulmonary disease) (HCC)   . Seizures (HCC)    History reviewed. No pertinent past surgical history. History reviewed. No pertinent family history. Social History  Substance Use Topics  . Smoking status: Former Smoker    Types: Cigarettes    Quit date: 02/06/2015  . Smokeless tobacco: None  . Alcohol Use: Yes     Comment: "monthy"    Review of Systems  Musculoskeletal: Positive for back pain.      Allergies  Oxycodone-acetaminophen; Oxycodone hcl; and Percocet  Home Medications   Prior to Admission medications   Medication Sig Start Date End Date Taking? Authorizing Provider  albuterol (PROVENTIL HFA;VENTOLIN HFA) 108 (90 BASE) MCG/ACT inhaler  Inhale 2 puffs into the lungs every 6 (six) hours as needed. For shortness of breath    Historical Provider, MD  HYDROcodone-acetaminophen (NORCO) 10-325 MG per tablet Take 1 tablet by mouth every 4 (four) hours as needed. For pain    Historical Provider, MD  ibuprofen (ADVIL,MOTRIN) 800 MG tablet Take 800 mg by mouth every 8 (eight) hours as needed. For pain     Historical Provider, MD  methocarbamol (ROBAXIN) 750 MG tablet 1 tab po BID x 10 days 01/20/11   April Palumbo, MD  multivitamin (ONE-A-DAY MEN'S) TABS Take 1 tablet by mouth daily.      Historical Provider, MD   BP 131/89 mmHg  Pulse 102  Temp(Src) 98 F (36.7 C) (Oral)  Resp 25  Ht 6\' 1"  (1.854 m)  Wt 120.203 kg  BMI 34.97 kg/m2  SpO2 99% Physical Exam  Constitutional: He is oriented to person, place, and time. He appears well-developed and well-nourished.  HENT:  Head: Normocephalic.  Eyes: EOM are normal.  Neck: Normal range of motion.  Cardiovascular: Normal rate and regular rhythm.   Pulmonary/Chest: Effort normal.  Abdominal: He exhibits no distension.  Musculoskeletal: He exhibits tenderness.  Tender left ac joint left shoulder,  Pain with movement  Neurological: He is alert and oriented to person, place, and time.  Psychiatric: He has a normal mood and affect.  Nursing note  and vitals reviewed.   ED Course  Procedures (including critical care time) Labs Review Labs Reviewed - No data to display  Imaging Review Dg Clavicle Left  02/13/2015  CLINICAL DATA:  Left shoulder pain after fall this morning. EXAM: LEFT CLAVICLE - 2+ VIEWS COMPARISON:  None. FINDINGS: No definite fracture is noted. There is continued widening of the left acromioclavicular joint suggesting separation as described on prior exam. No definite soft tissue abnormality is noted. IMPRESSION: No evidence of left clavicular fracture. Electronically Signed   By: Lupita RaiderJames  Green Jr, M.D.   On: 02/13/2015 09:51   Dg Shoulder Left  02/13/2015  CLINICAL  DATA:  Fall EXAM: LEFT SHOULDER - 2+ VIEW COMPARISON:  None. FINDINGS: There is superior displacement of the peripheral clavicle with respect to the acromion on. The coracoclavicular interval is also somewhat widened at 16 mm. No fracture. Glenohumeral joint is anatomic. IMPRESSION: The AC joint and coracoclavicular interval are both displaced or widened. Injury to the [facility] joint and coracoclavicular ligament may be present. Weightbearing views or MRI may be helpful to further delineate. Electronically Signed   By: Jolaine ClickArthur  Hoss M.D.   On: 02/13/2015 09:11   I have personally reviewed and evaluated these images and lab results as part of my medical decision-making.   EKG Interpretation None      MDM   Final diagnoses:  AC joint dislocation, left, initial encounter    Schedule to see the Orthopaedist for evalution Hydrocodone sling    Elson AreasLeslie K Jonathan Corpus, PA-C 02/13/15 1714  Donnetta HutchingBrian Cook, MD 02/14/15 1617  Lonia SkinnerLeslie K False PassSofia, PA-C 03/01/15 1433  Donnetta HutchingBrian Cook, MD 03/02/15 1332

## 2015-02-13 NOTE — Discharge Instructions (Signed)
Shoulder Separation  A shoulder separation (acromioclavicular separation) is an injury to the connecting tissue (ligament) between the top of your shoulder blade (acromion) and your collarbone (clavicle).  The ligament may be stretched, partially torn, or completely torn.   A stretched ligament may not cause very much pain, and it does not move the collarbone out of place. A stretched ligament looks normal on an X-ray.   An injury that is a bit worse may partially tear a ligament and move the collarbone slightly out of place.   A serious injury completely tears both shoulder ligaments. This moves the collarbone severely out of position and changes the way that the shoulder looks (deformity).  CAUSES  The most common cause of a shoulder separation is falling on or receiving a blow to the top of the shoulder. Falling with an outstretched arm may also cause this injury.  RISK FACTORS  You may be at greater risk of a shoulder separation if:   You are male.   You are younger than age 35.   You play a contact sport, such as football or hockey.  SIGNS AND SYMPTOMS  The most common symptom of a shoulder separation is pain on the top of the shoulder after falling on it or receiving a blow to it. Other signs and symptoms include:   Shoulder deformity.   Swelling of the shoulder.   Decreased ability to move the shoulder.   Bruising on top of the shoulder.  DIAGNOSIS  Your health care provider may suspect a shoulder separation based on your symptoms and the details of a recent injury. A physical exam will be done. During this exam, the health care provider may:   Press on your shoulder.   Test the movement of your shoulder.   Ask you to hold a weight in your hand to see if the separation increases.   Do an X-ray.  TREATMENT   A stretch injury may require only a sling, pain medicine, and cold packs. This treatment may last for 2-12 weeks. You may also have physical therapy. A physical therapist will teach you to  do daily exercises to strengthen your shoulder muscles and prevent stiffness.   A complete tear may require surgery to repair the torn ligament. After surgery, you will also require a sling, pain medicine, and cold packs. Recovery may take longer. You may also need more physical therapy.  HOME CARE INSTRUCTIONS   Take medicines only as directed by your health care provider.   Apply ice to the top of your shoulder:   Put ice in a plastic bag.   Place a towel between your skin and the bag.   Leave the ice on for 20 minutes, 2-3 times a day.   Wear your sling or splint as directed by your health care provider.   You may be able to remove your sling to do your physical therapy exercises.   Ask your health care provider when you can stop wearing the sling.   Do not do any activities that make your pain worse.   Do not lift anything that is heavier than 10 lb (4.5 kg) on the injured side of your body.   Ask your health care provider when you can return to athletic activities.  SEEK MEDICAL CARE IF:   Your pain medicine is not relieving your pain.   Your pain and stiffness are not improving after 2 weeks.   You are unable to do your physical therapy exercises because   of pain or stiffness.     This information is not intended to replace advice given to you by your health care provider. Make sure you discuss any questions you have with your health care provider.     Document Released: 11/08/2004 Document Revised: 02/19/2014 Document Reviewed: 07/01/2013  [employer]vier Interactive Patient Education 2016 [employer]

## 2015-12-15 ENCOUNTER — Encounter (HOSPITAL_COMMUNITY): Payer: Self-pay | Admitting: Emergency Medicine

## 2015-12-15 ENCOUNTER — Emergency Department (HOSPITAL_COMMUNITY)
Admission: EM | Admit: 2015-12-15 | Discharge: 2015-12-15 | Disposition: A | Discharge status: Home / Self Care | Payer: Self-pay | Attending: Emergency Medicine | Admitting: Emergency Medicine

## 2015-12-15 ENCOUNTER — Emergency Department (HOSPITAL_COMMUNITY): Payer: Self-pay

## 2015-12-15 ENCOUNTER — Encounter (HOSPITAL_COMMUNITY): Payer: Self-pay | Admitting: *Deleted

## 2015-12-15 DIAGNOSIS — Y929 Unspecified place or not applicable: Secondary | ICD-10-CM | POA: Insufficient documentation

## 2015-12-15 DIAGNOSIS — J449 Chronic obstructive pulmonary disease, unspecified: Secondary | ICD-10-CM | POA: Insufficient documentation

## 2015-12-15 DIAGNOSIS — Z87891 Personal history of nicotine dependence: Secondary | ICD-10-CM | POA: Insufficient documentation

## 2015-12-15 DIAGNOSIS — M5416 Radiculopathy, lumbar region: Secondary | ICD-10-CM | POA: Insufficient documentation

## 2015-12-15 DIAGNOSIS — G8929 Other chronic pain: Secondary | ICD-10-CM | POA: Insufficient documentation

## 2015-12-15 DIAGNOSIS — Y939 Activity, unspecified: Secondary | ICD-10-CM | POA: Insufficient documentation

## 2015-12-15 DIAGNOSIS — Y999 Unspecified external cause status: Secondary | ICD-10-CM | POA: Insufficient documentation

## 2015-12-15 DIAGNOSIS — Z79899 Other long term (current) drug therapy: Secondary | ICD-10-CM | POA: Insufficient documentation

## 2015-12-15 DIAGNOSIS — W19XXXA Unspecified fall, initial encounter: Secondary | ICD-10-CM | POA: Insufficient documentation

## 2015-12-15 DIAGNOSIS — J45909 Unspecified asthma, uncomplicated: Secondary | ICD-10-CM | POA: Insufficient documentation

## 2015-12-15 DIAGNOSIS — M545 Low back pain: Secondary | ICD-10-CM | POA: Insufficient documentation

## 2015-12-15 DIAGNOSIS — W228XXA Striking against or struck by other objects, initial encounter: Secondary | ICD-10-CM | POA: Insufficient documentation

## 2015-12-15 MED ORDER — DIPHENHYDRAMINE HCL 25 MG PO CAPS
25.0000 mg | ORAL_CAPSULE | Freq: Once | ORAL | Status: AC
  Administered 2015-12-15: 25 mg via ORAL
  Filled 2015-12-15: qty 1

## 2015-12-15 MED ORDER — OXYCODONE-ACETAMINOPHEN 5-325 MG PO TABS: 1.0000 | ORAL_TABLET | ORAL | 0 refills | Status: DC | PRN

## 2015-12-15 MED ORDER — CYCLOBENZAPRINE HCL 10 MG PO TABS: 10.0000 mg | ORAL_TABLET | Freq: Three times a day (TID) | ORAL | 0 refills | Status: DC | PRN

## 2015-12-15 MED ORDER — HYDROMORPHONE HCL 1 MG/ML IJ SOLN
1.0000 mg | Freq: Once | INTRAMUSCULAR | Status: AC
  Administered 2015-12-15: 1 mg via INTRAMUSCULAR
  Filled 2015-12-15: qty 1

## 2015-12-15 MED ORDER — ONDANSETRON 8 MG PO TBDP
8.0000 mg | ORAL_TABLET | Freq: Once | ORAL | Status: AC
  Administered 2015-12-15: 8 mg via ORAL
  Filled 2015-12-15: qty 1

## 2015-12-15 MED ORDER — PREDNISONE 10 MG PO TABS: ORAL_TABLET | ORAL | 0 refills | Status: DC

## 2015-12-15 NOTE — ED Triage Notes (Signed)
Pt reports hx of back pain. Pt had a fall several days ago and having pain to lower back and left hip since the fall. Takes pain meds at home but has ran out.

## 2015-12-15 NOTE — ED Provider Notes (Signed)
[facility]-EMERGENCY DEPT Provider Note   CSN: [id number] Arrival date & time: 12/15/15  [id number]  By signing my name below, I, Freida Busmaniana Omoyeni, attest that this documentation has been prepared under the direction and in the presence of non-physician practitioner, Roxy Horsemanobert Adalynne Steffensmeier, PA-C. Electronically Signed: Freida Busmaniana Omoyeni, Scribe. 12/15/2015. 9:36 AM.    History   Chief Complaint Chief Complaint  Patient presents with  . Back Pain    The history is provided by the patient. No language interpreter was used.     HPI Comments:  [name] is a 50 y.o. male with a history of chronic back pain, who presents to the Emergency Department complaining of increased lower back pain s/p fall 5 days ago.  State that he landed on a window ledge. He takes chronic meds daily due to his chronic pain states they have not provided relief. No bowel/blader incontinence.     Past Medical History:  Diagnosis Date  . Arthritis   . Asthma   . Chronic neck pain   . COPD (chronic obstructive pulmonary disease) (HCC)   . Injury of jawline   . Mental disorder   . Ruptured disc, cervical   . Seizures (HCC)   . Torn rotator cuff     There are no active problems to display for this patient.   History reviewed. No pertinent surgical history.     Home Medications    Prior to Admission medications   Medication Sig Start Date End Date Taking? Authorizing Provider  albuterol (PROVENTIL HFA;VENTOLIN HFA) 108 (90 BASE) MCG/ACT inhaler Inhale 2 puffs into the lungs every 6 (six) hours as needed. For shortness of breath    Historical Provider, MD  HYDROcodone-acetaminophen (NORCO) 10-325 MG tablet Take 1 tablet by mouth every 4 (four) hours as needed. For pain 02/13/15   Elson AreasLeslie K Sofia, PA-C  ibuprofen (ADVIL,MOTRIN) 800 MG tablet Take 800 mg by mouth every 8 (eight) hours as needed. For pain     Historical Provider, MD  methocarbamol (ROBAXIN) 750 MG tablet 1 tab po BID x 10 days 01/20/11   April Palumbo, MD    multivitamin (ONE-A-DAY MEN'S) TABS Take 1 tablet by mouth daily.      Historical Provider, MD    Family History History reviewed. No pertinent family history.  Social History Social History  Substance Use Topics  . Smoking status: Former Smoker    Types: Cigarettes    Quit date: 02/06/2015  . Smokeless tobacco: Not on file  . Alcohol use Yes     Comment: "monthy"     Allergies   Oxycodone-acetaminophen; Oxycodone hcl; and Percocet [oxycodone-acetaminophen]   Review of Systems Review of Systems  Constitutional: Negative for chills and fever.  Respiratory: Negative for shortness of breath.   Cardiovascular: Negative for chest pain.  Musculoskeletal: Positive for back pain.  Neurological: Negative for weakness and numbness.     Physical Exam Updated Vital Signs BP 99/77 (BP Location: Left Arm)   Pulse 76   Temp 97.5 F (36.4 C) (Oral)   Resp 16   SpO2 98%   Physical Exam Constitutional: Pt appears well-developed and well-nourished. No distress.  HENT:  Head: Normocephalic and atraumatic.  Mouth/Throat: Oropharynx is clear and moist. No oropharyngeal exudate.  Eyes: Conjunctivae are normal.  Neck: Normal range of motion. Neck supple.  No meningismus Cardiovascular: Normal rate, regular rhythm and intact distal pulses.   Pulmonary/Chest: Effort normal and breath sounds normal. No respiratory distress. Pt has no wheezes.  Abdominal: Pt  exhibits no distension Musculoskeletal:  Left lumbar paraspinal muscles tender to palpation, no bony CTLS spine tenderness, deformity, step-off, or crepitus Lymphadenopathy: Pt has no cervical adenopathy.  Neurological: Pt is alert and oriented Speech is clear and goal oriented, follows commands Normal 5/5 strength in upper and lower extremities bilaterally including dorsiflexion and plantar flexion Sensation intact Great toe extension intact Moves extremities without ataxia, coordination intact Antalgic gait Normal  balance  Skin: Skin is warm and dry. No rash noted. Pt is not diaphoretic. No erythema.  Psychiatric: Pt has a normal mood and affect. Behavior is normal.  Nursing note and vitals reviewed.   ED Treatments / Results  DIAGNOSTIC STUDIES:  Oxygen Saturation is 98% on RA, normal by my interpretation.    COORDINATION OF CARE:  9:43 AM Discussed treatment plan with pt at bedside which includes XR of back and pt declined; states he would like to leave at this time and go to another facility.  Labs (all labs ordered are listed, but only abnormal results are displayed) Labs Reviewed - No data to display  EKG  EKG Interpretation None       Radiology No results found.  Procedures Procedures (including critical care time)  Medications Ordered in ED Medications - No data to display   Initial Impression / Assessment and Plan / ED Course  I have reviewed the triage vital signs and the nursing notes.  Pertinent labs & imaging results that were available during my care of the patient were reviewed by me and considered in my medical decision making (see chart for details).  Clinical Course     Patient with chronic back pain.  Patient reportedly fell 5 days ago, and showed me a picture of a large bruise on his left low back.  Curiously the bruise is no longer present.  I would expect a bruise the size of the one pictured to at least still be partially visible.  I offered the patient imaging of back to rule out new injury and said that I would give pain medication if there were a new finding.  Patient states that if he does not get pain medicine now, he is going to leave and [facility].  I suggested that we see what the imaging shows and go from there.  At this point, the patient stated that he does not want the imaging and would like to be discharged to go to [facility].  Patient ambulates from bed to wheel chair. Appears safe for discharge at this time.   Final Clinical  Impressions(s) / ED Diagnoses   Final diagnoses:  Chronic low back pain, unspecified back pain laterality, with sciatica presence unspecified    New Prescriptions New Prescriptions   No medications on file   I personally performed the services described in this documentation, which was scribed in my presence. The recorded information has been reviewed and is accurate.      Roxy HorsemanRobert Cleburn Maiolo, PA-C 12/15/15 [id number]    Charlynne Panderavid Hsienta Yao, MD 12/15/15 [phone]

## 2015-12-15 NOTE — ED Triage Notes (Signed)
Pt with hx of low back pain and injury, states he fell 4-5 days ago and has had increasing pain since.

## 2015-12-15 NOTE — ED Notes (Signed)
Pt states he had a fall 5 days ago. Pt states he thought it was just sore, had some bruising after fall on left lower back. Pt states the pain has worsened since and he has difficulty with any movement, has numbness in left foot. Pt barely able to transfer from wheelchair to bed. 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

## 2015-12-15 NOTE — Discharge Instructions (Signed)
Follow-up with Dr. Juanetta GoslingHawkins.  Return here for any worsening symptoms

## 2015-12-17 NOTE — ED Provider Notes (Signed)
[facility] DEPT Provider Note   CSN: [id number] Arrival date & time: 12/15/15  1049     History   Chief Complaint Chief Complaint  Patient presents with  . Back Pain    HPI [name] is a 50 y.o. male.  HPI  [name] is a 50 y.o. male with hx of chronic low back and neck pain, presents to the Emergency Department complaining of worsening low back pain after a fall 4-5 days prior to arrival.  He states that he fell against a window ledge that resulted in a large bruise to his left back.  He states the bruise has improved, but he continues to have increased pain to his left back that radiates into his left leg.  He states that he has an appt with his PCP next week.  He denies numbness, weakness of the LE's, abd pain, urine or bowel changes, fever or chills.  He was seen earlier at [facility], but decided to come here prior to having XR's done there.  Past Medical History:  Diagnosis Date  . Arthritis   . Asthma   . Chronic neck pain   . COPD (chronic obstructive pulmonary disease) (HCC)   . Injury of jawline   . Mental disorder   . Ruptured disc, cervical   . Seizures (HCC)   . Torn rotator cuff     There are no active problems to display for this patient.   History reviewed. No pertinent surgical history.     Home Medications    Prior to Admission medications   Medication Sig Start Date End Date Taking? Authorizing Provider  albuterol (PROVENTIL HFA;VENTOLIN HFA) 108 (90 BASE) MCG/ACT inhaler Inhale 2 puffs into the lungs every 6 (six) hours as needed. For shortness of breath   Yes Historical Provider, MD  HYDROcodone-acetaminophen (NORCO) 10-325 MG tablet Take 1 tablet by mouth every 4 (four) hours as needed. For pain 02/13/15  Yes Elson Areas, PA-C  cyclobenzaprine (FLEXERIL) 10 MG tablet Take 1 tablet (10 mg total) by mouth 3 (three) times daily as needed. 12/15/15   Tyrome Donatelli, PA-C  oxyCODONE-acetaminophen (PERCOCET/ROXICET) 5-325 MG tablet Take 1  tablet by mouth every 4 (four) hours as needed. 12/15/15   Challis Crill, PA-C  predniSONE (DELTASONE) 10 MG tablet Take 6 tablets day one, 5 tablets day two, 4 tablets day three, 3 tablets day four, 2 tablets day five, then 1 tablet day six 12/15/15   Pauline Aus, PA-C    Family History Family History  Problem Relation Age of Onset  . Heart failure Father   . Cancer Father   . Alcoholism Brother     Social History Social History  Substance Use Topics  . Smoking status: Former Smoker    Types: Cigarettes    Quit date: 02/06/2015  . Smokeless tobacco: Never Used  . Alcohol use No     Comment: "monthy"     Allergies   Oxycodone-acetaminophen; Oxycodone hcl; and Percocet [oxycodone-acetaminophen]   Review of Systems Review of Systems  Constitutional: Negative for fever.  Respiratory: Negative for shortness of breath.   Gastrointestinal: Negative for abdominal pain, constipation and vomiting.  Genitourinary: Negative for decreased urine volume, difficulty urinating, dysuria, flank pain and hematuria.  Musculoskeletal: Positive for back pain. Negative for joint swelling.  Skin: Negative for rash.  Neurological: Negative for weakness and numbness.  All other systems reviewed and are negative.    Physical Exam Updated Vital Signs BP 133/77 (BP Location: Left  Arm)   Pulse 68   Temp 97.8 F (36.6 C) (Oral)   Resp 16   Ht 6\' 1"  (1.854 m)   Wt 114.3 kg   SpO2 100%   BMI 33.25 kg/m   Physical Exam  Constitutional: He is oriented to person, place, and time. He appears well-developed and well-nourished. No distress.  HENT:  Head: Normocephalic and atraumatic.  Neck: Normal range of motion. Neck supple.  Cardiovascular: Normal rate, regular rhythm and intact distal pulses.   No murmur heard. Pulmonary/Chest: Effort normal and breath sounds normal. No respiratory distress.  Abdominal: Soft. He exhibits no distension. There is no tenderness.  Musculoskeletal: He  exhibits tenderness. He exhibits no edema.       Lumbar back: He exhibits tenderness and pain. He exhibits normal range of motion, no swelling, no deformity, no laceration and normal pulse.  ttp of the lower lumbar spine and left paraspinal muscles.  DP pulses are brisk and symmetrical.  Distal sensation intact.  Pt has 5/5 strength against resistance of bilateral lower extremities.  No ecchymosis or hematoma   Neurological: He is alert and oriented to person, place, and time. He has normal strength. No sensory deficit. He exhibits normal muscle tone. Coordination and gait normal.  Reflex Scores:      Patellar reflexes are 2+ on the right side and 2+ on the left side.      Achilles reflexes are 2+ on the right side and 2+ on the left side. Skin: Skin is warm and dry. No rash noted.  Nursing note and vitals reviewed.    ED Treatments / Results  Labs (all labs ordered are listed, but only abnormal results are displayed) Labs Reviewed - No data to display  EKG  EKG Interpretation None       Radiology Dg Lumbar Spine Complete  Result Date: 12/15/2015 CLINICAL DATA:  Lower back pain that radiates to the left foot. EXAM: LUMBAR SPINE - COMPLETE 4+ VIEW COMPARISON:  03/02/2015 FINDINGS: Old compression fracture at L1. Minimal retrolisthesis at L1-L2 is stable. The other vertebral body heights are maintained. Disc spaces are maintained except for at T12-L1. Chronic disc space loss at T12-L1. Mild degenerative endplate changes in lumbar spine. No pars defect. IMPRESSION: No acute bone abnormality. Old compression fracture at L1. Electronically Signed   By: Richarda OverlieAdam  Henn M.D.   On: 12/15/2015 11:28     Procedures Procedures (including critical care time)  Medications Ordered in ED Medications  HYDROmorphone (DILAUDID) injection 1 mg (1 mg Intramuscular Given 12/15/15 1234)  ondansetron (ZOFRAN-ODT) disintegrating tablet 8 mg (8 mg Oral Given 12/15/15 1234)  diphenhydrAMINE (BENADRYL) capsule  25 mg (25 mg Oral Given 12/15/15 1234)     Initial Impression / Assessment and Plan / ED Course  I have reviewed the triage vital signs and the nursing notes.  Pertinent labs & imaging results that were available during my care of the patient were reviewed by me and considered in my medical decision making (see chart for details).  Clinical Course    Pt is well appearing.  No focal neuro deficits, XR results discussed and possibility for disc impingement.  No concerning sx's for emergent neurological process   He ambulates with a steady gait.  Pt states that he currently does not have insurance and prefers to f/u with his PCP for possible MRI if sx's do not improve with tx plan.    The patient appears reasonably screened and/or stabilized for discharge and I doubt any  other medical condition or other [facility] requiring further screening, evaluation, or treatment in the ED at this time prior to discharge.   Final Clinical Impressions(s) / ED Diagnoses   Final diagnoses:  Lumbar radiculopathy    New Prescriptions Discharge Medication List as of 12/15/2015  2:22 PM    START taking these medications   Details  cyclobenzaprine (FLEXERIL) 10 MG tablet Take 1 tablet (10 mg total) by mouth 3 (three) times daily as needed., Starting Thu 12/15/2015, Print    oxyCODONE-acetaminophen (PERCOCET/ROXICET) 5-325 MG tablet Take 1 tablet by mouth every 4 (four) hours as needed., Starting Thu 12/15/2015, Print    predniSONE (DELTASONE) 10 MG tablet Take 6 tablets day one, 5 tablets day two, 4 tablets day three, 3 tablets day four, 2 tablets day five, then 1 tablet day six, Print         Pauline Ausammy Cari Burgo, PA-C 12/17/15 1334    Maia PlanJoshua G Long, MD 12/17/15 1553

## 2015-12-22 ENCOUNTER — Other Ambulatory Visit (HOSPITAL_COMMUNITY): Payer: Self-pay | Admitting: Pulmonary Disease

## 2015-12-22 DIAGNOSIS — M545 Low back pain: Secondary | ICD-10-CM

## 2015-12-26 ENCOUNTER — Ambulatory Visit (HOSPITAL_COMMUNITY)
Admission: RE | Admit: 2015-12-26 | Discharge: 2015-12-26 | Disposition: A | Discharge status: Home / Self Care | Payer: Self-pay | Source: Ambulatory Visit | Attending: Pulmonary Disease | Admitting: Pulmonary Disease

## 2015-12-26 DIAGNOSIS — M545 Low back pain: Secondary | ICD-10-CM | POA: Insufficient documentation

## 2015-12-26 DIAGNOSIS — M5126 Other intervertebral disc displacement, lumbar region: Secondary | ICD-10-CM | POA: Insufficient documentation

## 2016-04-10 ENCOUNTER — Encounter (INDEPENDENT_AMBULATORY_CARE_PROVIDER_SITE_OTHER): Payer: Self-pay | Admitting: Podiatry

## 2016-04-10 NOTE — Progress Notes (Signed)
This encounter was created in error - please disregard.

## 2016-05-09 ENCOUNTER — Emergency Department (HOSPITAL_COMMUNITY)
Admission: EM | Admit: 2016-05-09 | Discharge: 2016-05-09 | Disposition: A | Discharge status: Home / Self Care | Payer: Medicaid Other | Attending: Emergency Medicine | Admitting: Emergency Medicine

## 2016-05-09 ENCOUNTER — Encounter (HOSPITAL_COMMUNITY): Payer: Self-pay | Admitting: *Deleted

## 2016-05-09 DIAGNOSIS — J449 Chronic obstructive pulmonary disease, unspecified: Secondary | ICD-10-CM | POA: Diagnosis not present

## 2016-05-09 DIAGNOSIS — Z79899 Other long term (current) drug therapy: Secondary | ICD-10-CM | POA: Insufficient documentation

## 2016-05-09 DIAGNOSIS — M5432 Sciatica, left side: Secondary | ICD-10-CM

## 2016-05-09 DIAGNOSIS — F17210 Nicotine dependence, cigarettes, uncomplicated: Secondary | ICD-10-CM | POA: Diagnosis not present

## 2016-05-09 DIAGNOSIS — J45909 Unspecified asthma, uncomplicated: Secondary | ICD-10-CM | POA: Diagnosis not present

## 2016-05-09 DIAGNOSIS — M5442 Lumbago with sciatica, left side: Secondary | ICD-10-CM | POA: Insufficient documentation

## 2016-05-09 DIAGNOSIS — F17290 Nicotine dependence, other tobacco product, uncomplicated: Secondary | ICD-10-CM | POA: Insufficient documentation

## 2016-05-09 DIAGNOSIS — M545 Low back pain: Secondary | ICD-10-CM | POA: Diagnosis present

## 2016-05-09 MED ORDER — HYDROMORPHONE HCL 1 MG/ML IJ SOLN
1.0000 mg | Freq: Once | INTRAMUSCULAR | Status: AC
Start: 2016-05-09 — End: 2016-05-09
  Administered 2016-05-09: 1 mg via INTRAMUSCULAR
  Filled 2016-05-09: qty 1

## 2016-05-09 MED ORDER — PREDNISONE 50 MG PO TABS
60.0000 mg | ORAL_TABLET | Freq: Once | ORAL | Status: AC
  Administered 2016-05-09: 60 mg via ORAL
  Filled 2016-05-09: qty 1

## 2016-05-09 MED ORDER — METHOCARBAMOL 500 MG PO TABS: 1000.0000 mg | ORAL_TABLET | Freq: Four times a day (QID) | ORAL | 0 refills | Status: AC

## 2016-05-09 MED ORDER — PREDNISONE 10 MG PO TABS: ORAL_TABLET | ORAL | 0 refills | Status: DC

## 2016-05-09 NOTE — Discharge Instructions (Signed)
Take your next dose of prednisone tomorrow morning.  Use the the other medicines as directed.  Do not drive within 4 hours of taking robaxin as this will make you drowsy.  Avoid lifting,  Bending,  Twisting or any other activity that worsens your pain over the next week.  Apply an  icepack  to your lower back for 10-15 minutes every 2 hours for the next 2 day.  You should get rechecked if your symptoms are not better over the next 5 days,  Or you develop increased pain,  Weakness in your leg(s) or loss of bladder or bowel function - these are symptoms of a worse injury.

## 2016-05-09 NOTE — ED Triage Notes (Signed)
Pt comes in with lower back pain that moves into his left hip and down his leg. States this is a chronic issue. Denies any new injury.

## 2016-05-09 NOTE — ED Notes (Signed)
Clifford FanningJulie, pa at bedside speaking to pt prior to discharge.

## 2016-05-09 NOTE — ED Provider Notes (Signed)
[facility] DEPT Provider Note   CSN: [id number] Arrival date & time: 05/09/16  1033     History   Chief Complaint Chief Complaint  Patient presents with  . Back Pain    HPI [name] is a 51 y.o. male  Presenting with acute on chronic low back pain which has which has been worsened for 3 days since picking up a toddler grandchild, pain worsened slowly over the initial 24 hours.    There is radiation of pain into his left lower leg to his heel.  There has been no weakness  in the lower extremities but endorses chronic bilateral numbness in his bilateral toes and distal feet and was told by his pcp this is from his chronic lumbar disk disease.  He does endorse numbness down the left lateral buttock to his heel at this time.  He denies urinary or bowel retention or incontinence.  Patient does not have a history of cancer or IVDU.  The patient is on chronic oxycodone 10 mg tablets, generally takes bid daily, with flare up takes up to qid.  He has had 2 doses today without improvement in pain.   The history is provided by the patient.    Past Medical History:  Diagnosis Date  . Arthritis   . Asthma   . Chronic neck pain   . COPD (chronic obstructive pulmonary disease) (HCC)   . Injury of jawline   . Mental disorder   . Ruptured disc, cervical   . Seizures (HCC)   . Torn rotator cuff     There are no active problems to display for this patient.   History reviewed. No pertinent surgical history.     Home Medications    Prior to Admission medications   Medication Sig Start Date End Date Taking? Authorizing Provider  albuterol (PROVENTIL HFA;VENTOLIN HFA) 108 (90 BASE) MCG/ACT inhaler Inhale 2 puffs into the lungs every 6 (six) hours as needed. For shortness of breath   Yes Historical Provider, MD  ALPRAZolam Prudy Feeler(XANAX) 1 MG tablet Take 1 mg by mouth 3 (three) times daily as needed for anxiety.   Yes Historical Provider, MD  oxyCODONE-acetaminophen (PERCOCET/ROXICET)  5-325 MG tablet Take 1 tablet by mouth every 4 (four) hours as needed. 12/15/15  Yes Tammy Triplett, PA-C  cyclobenzaprine (FLEXERIL) 10 MG tablet Take 1 tablet (10 mg total) by mouth 3 (three) times daily as needed. Patient not taking: Reported on 05/09/2016 12/15/15   Tammy Triplett, PA-C  HYDROcodone-acetaminophen (NORCO) 10-325 MG tablet Take 1 tablet by mouth every 4 (four) hours as needed. For pain Patient not taking: Reported on 05/09/2016 02/13/15   Elson AreasLeslie K Sofia, PA-C  methocarbamol (ROBAXIN) 500 MG tablet Take 2 tablets (1,000 mg total) by mouth 4 (four) times daily. 05/09/16 05/19/16  Burgess AmorJulie Idalys Konecny, PA-C  predniSONE (DELTASONE) 10 MG tablet Take 6 tablets day one, 5 tablets day two, 4 tablets day three, 3 tablets day four, 2 tablets day five, then 1 tablet day six 05/09/16   Burgess AmorJulie Malyiah Fellows, PA-C    Family History Family History  Problem Relation Age of Onset  . Heart failure Father   . Cancer Father   . Alcoholism Brother     Social History Social History  Substance Use Topics  . Smoking status: Current Some Day Smoker    Types: Cigarettes, Cigars  . Smokeless tobacco: Never Used  . Alcohol use No     Comment: "monthy"     Allergies   Oxycodone-acetaminophen; Oxycodone  hcl; and Percocet [oxycodone-acetaminophen]   Review of Systems Review of Systems  Constitutional: Negative for fever.  Respiratory: Negative for shortness of breath.   Cardiovascular: Negative for chest pain and leg swelling.  Gastrointestinal: Negative for abdominal distention, abdominal pain and constipation.  Genitourinary: Negative for difficulty urinating, dysuria, flank pain, frequency and urgency.  Musculoskeletal: Positive for back pain. Negative for gait problem and joint swelling.  Skin: Negative for rash.  Neurological: Negative for weakness and numbness.     Physical Exam Updated Vital Signs BP 134/88 (BP Location: Left Arm)   Pulse 67   Temp 98.1 F (36.7 C) (Oral)   Resp 16   Ht 6\' 2"  (1.88  m)   Wt 102.1 kg   SpO2 98%   BMI 28.89 kg/m   Physical Exam  Constitutional: He appears well-developed and well-nourished.  HENT:  Head: Normocephalic.  Eyes: Conjunctivae are normal.  Neck: Normal range of motion. Neck supple.  Cardiovascular: Normal rate and intact distal pulses.   Pedal pulses normal.  Pulmonary/Chest: Effort normal.  Abdominal: Soft. Bowel sounds are normal. He exhibits no distension and no mass.  Musculoskeletal: Normal range of motion. He exhibits no edema.       Lumbar back: He exhibits tenderness. He exhibits no swelling, no edema, no spasm and normal pulse.  ttp left paralumbar and left sciatic notch.    Neurological: He is alert. He has normal strength. He displays no atrophy and no tremor. No sensory deficit. Gait normal.  Reflex Scores:      Patellar reflexes are 2+ on the right side and 2+ on the left side.      Achilles reflexes are 2+ on the right side and 2+ on the left side. No strength deficit noted in hip and knee flexor and extensor muscle groups.  Ankle flexion and extension intact. Ambulatory without left foot drop.    Skin: Skin is warm and dry.  Psychiatric: He has a normal mood and affect.  Nursing note and vitals reviewed.    ED Treatments / Results  Labs (all labs ordered are listed, but only abnormal results are displayed) Labs Reviewed - No data to display  EKG  EKG Interpretation None       Radiology No results found.  Procedures Procedures (including critical care time)  Medications Ordered in ED Medications  HYDROmorphone (DILAUDID) injection 1 mg (1 mg Intramuscular Given 05/09/16 1134)  predniSONE (DELTASONE) tablet 60 mg (60 mg Oral Given 05/09/16 1134)     Initial Impression / Assessment and Plan / ED Course  I have reviewed the triage vital signs and the nursing notes.  Pertinent labs & imaging results that were available during my care of the patient were reviewed by me and considered in my medical  decision making (see chart for details).     Pt given dilaudid 1 mg IM, prednisone PO here.  Improved pain, he was able to ambulate with minimal discomfort.  Prescribed prednisone taper, robaxin, advised to continue his oxycodone prn per pcps instructions.    No neuro deficit on exam or by history to suggest emergent or surgical presentation.  Also discussed worsened sx that should prompt immediate re-evaluation including distal weakness, bowel/bladder retention/incontinence.        Final Clinical Impressions(s) / ED Diagnoses   Final diagnoses:  Sciatica of left side    New Prescriptions Discharge Medication List as of 05/09/2016 12:08 PM    START taking these medications   Details  methocarbamol (ROBAXIN)  500 MG tablet Take 2 tablets (1,000 mg total) by mouth 4 (four) times daily., Starting Wed 05/09/2016, Until Sat 05/19/2016, Print         Burgess Amor, PA-C 05/09/16 1425    Maia Plan, MD 05/09/16 [phone]

## 2016-05-09 NOTE — ED Notes (Signed)
Pt made aware to return if symptoms worsen or if any life threatening symptoms occur.   

## 2016-06-12 ENCOUNTER — Inpatient Hospital Stay (HOSPITAL_COMMUNITY)
Admission: EM | Admit: 2016-06-12 | Discharge: 2016-07-01 | DRG: 100 | Disposition: A | Discharge status: Home / Self Care | Payer: Medicaid Other | Attending: Family Medicine | Admitting: Family Medicine

## 2016-06-12 ENCOUNTER — Inpatient Hospital Stay (HOSPITAL_COMMUNITY): Payer: Medicaid Other

## 2016-06-12 ENCOUNTER — Emergency Department (HOSPITAL_COMMUNITY): Payer: Medicaid Other

## 2016-06-12 ENCOUNTER — Encounter (HOSPITAL_COMMUNITY): Payer: Self-pay | Admitting: Emergency Medicine

## 2016-06-12 DIAGNOSIS — F17210 Nicotine dependence, cigarettes, uncomplicated: Secondary | ICD-10-CM | POA: Diagnosis present

## 2016-06-12 DIAGNOSIS — F17290 Nicotine dependence, other tobacco product, uncomplicated: Secondary | ICD-10-CM | POA: Diagnosis present

## 2016-06-12 DIAGNOSIS — G629 Polyneuropathy, unspecified: Secondary | ICD-10-CM | POA: Diagnosis present

## 2016-06-12 DIAGNOSIS — J9690 Respiratory failure, unspecified, unspecified whether with hypoxia or hypercapnia: Secondary | ICD-10-CM | POA: Diagnosis present

## 2016-06-12 DIAGNOSIS — J449 Chronic obstructive pulmonary disease, unspecified: Secondary | ICD-10-CM | POA: Diagnosis not present

## 2016-06-12 DIAGNOSIS — Z87820 Personal history of traumatic brain injury: Secondary | ICD-10-CM

## 2016-06-12 DIAGNOSIS — I472 Ventricular tachycardia: Secondary | ICD-10-CM | POA: Diagnosis not present

## 2016-06-12 DIAGNOSIS — H9191 Unspecified hearing loss, right ear: Secondary | ICD-10-CM | POA: Diagnosis present

## 2016-06-12 DIAGNOSIS — J15212 Pneumonia due to Methicillin resistant Staphylococcus aureus: Secondary | ICD-10-CM | POA: Diagnosis not present

## 2016-06-12 DIAGNOSIS — J9601 Acute respiratory failure with hypoxia: Secondary | ICD-10-CM

## 2016-06-12 DIAGNOSIS — R4189 Other symptoms and signs involving cognitive functions and awareness: Secondary | ICD-10-CM | POA: Diagnosis present

## 2016-06-12 DIAGNOSIS — Y907 Blood alcohol level of 200-239 mg/100 ml: Secondary | ICD-10-CM | POA: Diagnosis present

## 2016-06-12 DIAGNOSIS — Z6827 Body mass index (BMI) 27.0-27.9, adult: Secondary | ICD-10-CM | POA: Diagnosis not present

## 2016-06-12 DIAGNOSIS — F10229 Alcohol dependence with intoxication, unspecified: Secondary | ICD-10-CM | POA: Diagnosis present

## 2016-06-12 DIAGNOSIS — J690 Pneumonitis due to inhalation of food and vomit: Secondary | ICD-10-CM | POA: Diagnosis not present

## 2016-06-12 DIAGNOSIS — R6521 Severe sepsis with septic shock: Secondary | ICD-10-CM | POA: Diagnosis not present

## 2016-06-12 DIAGNOSIS — Z781 Physical restraint status: Secondary | ICD-10-CM | POA: Diagnosis not present

## 2016-06-12 DIAGNOSIS — R4182 Altered mental status, unspecified: Secondary | ICD-10-CM

## 2016-06-12 DIAGNOSIS — E43 Unspecified severe protein-calorie malnutrition: Secondary | ICD-10-CM | POA: Diagnosis not present

## 2016-06-12 DIAGNOSIS — F10129 Alcohol abuse with intoxication, unspecified: Secondary | ICD-10-CM | POA: Diagnosis present

## 2016-06-12 DIAGNOSIS — R739 Hyperglycemia, unspecified: Secondary | ICD-10-CM | POA: Diagnosis not present

## 2016-06-12 DIAGNOSIS — Q899 Congenital malformation, unspecified: Secondary | ICD-10-CM

## 2016-06-12 DIAGNOSIS — Z978 Presence of other specified devices: Secondary | ICD-10-CM | POA: Diagnosis present

## 2016-06-12 DIAGNOSIS — E876 Hypokalemia: Secondary | ICD-10-CM | POA: Diagnosis not present

## 2016-06-12 DIAGNOSIS — G8929 Other chronic pain: Secondary | ICD-10-CM | POA: Diagnosis present

## 2016-06-12 DIAGNOSIS — D649 Anemia, unspecified: Secondary | ICD-10-CM | POA: Diagnosis not present

## 2016-06-12 DIAGNOSIS — Z01818 Encounter for other preprocedural examination: Secondary | ICD-10-CM

## 2016-06-12 DIAGNOSIS — I4581 Long QT syndrome: Secondary | ICD-10-CM | POA: Diagnosis not present

## 2016-06-12 DIAGNOSIS — R569 Unspecified convulsions: Secondary | ICD-10-CM | POA: Diagnosis present

## 2016-06-12 DIAGNOSIS — G40901 Epilepsy, unspecified, not intractable, with status epilepticus: Principal | ICD-10-CM | POA: Diagnosis present

## 2016-06-12 DIAGNOSIS — I10 Essential (primary) hypertension: Secondary | ICD-10-CM | POA: Diagnosis present

## 2016-06-12 DIAGNOSIS — Y636 Underdosing and nonadministration of necessary drug, medicament or biological substance: Secondary | ICD-10-CM | POA: Diagnosis present

## 2016-06-12 DIAGNOSIS — F10239 Alcohol dependence with withdrawal, unspecified: Secondary | ICD-10-CM | POA: Diagnosis not present

## 2016-06-12 DIAGNOSIS — A419 Sepsis, unspecified organism: Secondary | ICD-10-CM | POA: Diagnosis not present

## 2016-06-12 DIAGNOSIS — T884XXA Failed or difficult intubation, initial encounter: Secondary | ICD-10-CM

## 2016-06-12 DIAGNOSIS — J9600 Acute respiratory failure, unspecified whether with hypoxia or hypercapnia: Secondary | ICD-10-CM

## 2016-06-12 DIAGNOSIS — G9341 Metabolic encephalopathy: Secondary | ICD-10-CM | POA: Diagnosis present

## 2016-06-12 LAB — CBC WITH DIFFERENTIAL/PLATELET
BASOS PCT: 1 %
Basophils Absolute: 0.1 10*3/uL (ref 0.0–0.1)
Eosinophils Absolute: 0.2 10*3/uL (ref 0.0–0.7)
Eosinophils Relative: 2 %
HEMATOCRIT: 43.9 % (ref 39.0–52.0)
Hemoglobin: 15.5 g/dL (ref 13.0–17.0)
Lymphocytes Relative: 39 %
Lymphs Abs: 3.1 10*3/uL (ref 0.7–4.0)
MCH: 32.9 pg (ref 26.0–34.0)
MCHC: 35.3 g/dL (ref 30.0–36.0)
MCV: 93.2 fL (ref 78.0–100.0)
MONO ABS: 0.5 10*3/uL (ref 0.1–1.0)
MONOS PCT: 6 %
NEUTROS ABS: 4.1 10*3/uL (ref 1.7–7.7)
Neutrophils Relative %: 52 %
Platelets: 270 10*3/uL (ref 150–400)
RBC: 4.71 MIL/uL (ref 4.22–5.81)
RDW: 12.5 % (ref 11.5–15.5)
WBC: 8 10*3/uL (ref 4.0–10.5)

## 2016-06-12 LAB — BLOOD GAS, ARTERIAL
ACID-BASE DEFICIT: 1.9 mmol/L (ref 0.0–2.0)
BICARBONATE: 22.4 mmol/L (ref 20.0–28.0)
Drawn by: 234301
FIO2: 100
LHR: 16 {breaths}/min
O2 Saturation: 99 %
PCO2 ART: 44.6 mmHg (ref 32.0–48.0)
PEEP: 5 cmH2O
PH ART: 7.335 — AB (ref 7.350–7.450)
VT: 600 mL
pO2, Arterial: 229 mmHg — ABNORMAL HIGH (ref 83.0–108.0)

## 2016-06-12 LAB — COMPREHENSIVE METABOLIC PANEL
ALT: 20 U/L (ref 17–63)
ANION GAP: 9 (ref 5–15)
AST: 20 U/L (ref 15–41)
Albumin: 4.1 g/dL (ref 3.5–5.0)
Alkaline Phosphatase: 78 U/L (ref 38–126)
BILIRUBIN TOTAL: 0.3 mg/dL (ref 0.3–1.2)
BUN: 6 mg/dL (ref 6–20)
CO2: 27 mmol/L (ref 22–32)
Calcium: 9 mg/dL (ref 8.9–10.3)
Chloride: 107 mmol/L (ref 101–111)
Creatinine, Ser: 0.82 mg/dL (ref 0.61–1.24)
GLUCOSE: 104 mg/dL — AB (ref 65–99)
POTASSIUM: 4 mmol/L (ref 3.5–5.1)
Sodium: 143 mmol/L (ref 135–145)
TOTAL PROTEIN: 7.6 g/dL (ref 6.5–8.1)

## 2016-06-12 LAB — I-STAT CHEM 8, ED
BUN: 4 mg/dL — AB (ref 6–20)
CALCIUM ION: 1.1 mmol/L — AB (ref 1.15–1.40)
CHLORIDE: 105 mmol/L (ref 101–111)
CREATININE: 1.1 mg/dL (ref 0.61–1.24)
Glucose, Bld: 106 mg/dL — ABNORMAL HIGH (ref 65–99)
HCT: 45 % (ref 39.0–52.0)
HEMOGLOBIN: 15.3 g/dL (ref 13.0–17.0)
Potassium: 3.8 mmol/L (ref 3.5–5.1)
SODIUM: 145 mmol/L (ref 135–145)
TCO2: 28 mmol/L (ref 0–100)

## 2016-06-12 LAB — RAPID URINE DRUG SCREEN, HOSP PERFORMED
AMPHETAMINES: NOT DETECTED
BENZODIAZEPINES: POSITIVE — AB
Barbiturates: NOT DETECTED
Cocaine: NOT DETECTED
OPIATES: NOT DETECTED
Tetrahydrocannabinol: NOT DETECTED

## 2016-06-12 LAB — URINALYSIS, ROUTINE W REFLEX MICROSCOPIC
BILIRUBIN URINE: NEGATIVE
Glucose, UA: NEGATIVE mg/dL
Hgb urine dipstick: NEGATIVE
Ketones, ur: NEGATIVE mg/dL
Leukocytes, UA: NEGATIVE
NITRITE: NEGATIVE
PH: 6 (ref 5.0–8.0)
Protein, ur: NEGATIVE mg/dL
SPECIFIC GRAVITY, URINE: 1.003 — AB (ref 1.005–1.030)

## 2016-06-12 LAB — PROTIME-INR
INR: 0.95
PROTHROMBIN TIME: 12.7 s (ref 11.4–15.2)

## 2016-06-12 LAB — GLUCOSE, CAPILLARY
GLUCOSE-CAPILLARY: 100 mg/dL — AB (ref 65–99)
GLUCOSE-CAPILLARY: 129 mg/dL — AB (ref 65–99)
Glucose-Capillary: 105 mg/dL — ABNORMAL HIGH (ref 65–99)

## 2016-06-12 LAB — MAGNESIUM: MAGNESIUM: 2.1 mg/dL (ref 1.7–2.4)

## 2016-06-12 LAB — TSH: TSH: 1.197 u[IU]/mL (ref 0.350–4.500)

## 2016-06-12 LAB — MRSA PCR SCREENING: MRSA BY PCR: NEGATIVE

## 2016-06-12 LAB — ETHANOL: ALCOHOL ETHYL (B): 203 mg/dL — AB (ref <5)

## 2016-06-12 LAB — CBG MONITORING, ED: Glucose-Capillary: 101 mg/dL — ABNORMAL HIGH (ref 65–99)

## 2016-06-12 MED ORDER — ACETAMINOPHEN 650 MG RE SUPP
650.0000 mg | Freq: Four times a day (QID) | RECTAL | Status: DC | PRN
  Administered 2016-06-22: 650 mg via RECTAL
  Filled 2016-06-12: qty 1

## 2016-06-12 MED ORDER — FENTANYL CITRATE (PF) 100 MCG/2ML IJ SOLN
INTRAMUSCULAR | Status: AC
Start: 2016-06-12 — End: 2016-06-12
  Administered 2016-06-12: 100 ug via INTRAVENOUS
  Filled 2016-06-12: qty 2

## 2016-06-12 MED ORDER — FENTANYL CITRATE (PF) 100 MCG/2ML IJ SOLN
100.0000 ug | Freq: Once | INTRAMUSCULAR | Status: AC
  Administered 2016-06-12 (×2): 100 ug via INTRAVENOUS
  Filled 2016-06-12: qty 2

## 2016-06-12 MED ORDER — LORAZEPAM 2 MG/ML IJ SOLN
INTRAMUSCULAR | Status: AC
  Administered 2016-06-12: 2 mg via INTRAVENOUS
  Filled 2016-06-12: qty 1

## 2016-06-12 MED ORDER — ROCURONIUM BROMIDE 50 MG/5ML IV SOLN
1.0000 mg/kg | Freq: Once | INTRAVENOUS | Status: AC
  Administered 2016-06-12: 100 mg via INTRAVENOUS

## 2016-06-12 MED ORDER — ETOMIDATE 2 MG/ML IV SOLN
30.0000 mg | Freq: Once | INTRAVENOUS | Status: AC
  Administered 2016-06-12: 30 mg via INTRAVENOUS

## 2016-06-12 MED ORDER — DOCUSATE SODIUM 50 MG/5ML PO LIQD
100.0000 mg | Freq: Two times a day (BID) | ORAL | Status: DC | PRN
  Filled 2016-06-12: qty 10

## 2016-06-12 MED ORDER — BISACODYL 10 MG RE SUPP: 10.0000 mg | Freq: Every day | RECTAL | Status: DC | PRN

## 2016-06-12 MED ORDER — INSULIN ASPART 100 UNIT/ML ~~LOC~~ SOLN
0.0000 [IU] | SUBCUTANEOUS | Status: DC
  Administered 2016-06-14 – 2016-06-27 (×10): 1 [IU] via SUBCUTANEOUS

## 2016-06-12 MED ORDER — HALOPERIDOL LACTATE 5 MG/ML IJ SOLN
5.0000 mg | Freq: Four times a day (QID) | INTRAMUSCULAR | Status: DC | PRN
  Administered 2016-06-13 – 2016-06-21 (×5): 5 mg via INTRAMUSCULAR
  Filled 2016-06-12 (×3): qty 1

## 2016-06-12 MED ORDER — LEVETIRACETAM IN NACL 1000 MG/100ML IV SOLN
INTRAVENOUS | Status: AC
  Administered 2016-06-12: 1000 mg via INTRAVENOUS
  Filled 2016-06-12: qty 100

## 2016-06-12 MED ORDER — LEVETIRACETAM IN NACL 1000 MG/100ML IV SOLN
1000.0000 mg | Freq: Once | INTRAVENOUS | Status: AC
  Administered 2016-06-12: 1000 mg via INTRAVENOUS
  Filled 2016-06-12: qty 100

## 2016-06-12 MED ORDER — FENTANYL CITRATE (PF) 100 MCG/2ML IJ SOLN: 100.0000 ug | INTRAMUSCULAR | Status: DC | PRN

## 2016-06-12 MED ORDER — DEXMEDETOMIDINE HCL IN NACL 200 MCG/50ML IV SOLN
INTRAVENOUS | Status: AC
  Administered 2016-06-12: 1 ug/kg/h via INTRAVENOUS
  Filled 2016-06-12: qty 50

## 2016-06-12 MED ORDER — LORAZEPAM 2 MG/ML IJ SOLN
2.0000 mg | Freq: Once | INTRAMUSCULAR | Status: AC
  Administered 2016-06-12: 2 mg via INTRAVENOUS

## 2016-06-12 MED ORDER — ONDANSETRON HCL 4 MG PO TABS: 4.0000 mg | ORAL_TABLET | Freq: Four times a day (QID) | ORAL | Status: DC | PRN

## 2016-06-12 MED ORDER — FENTANYL CITRATE (PF) 100 MCG/2ML IJ SOLN
100.0000 ug | INTRAMUSCULAR | Status: DC | PRN
  Administered 2016-06-12 (×2): 100 ug via INTRAVENOUS
  Filled 2016-06-12 (×2): qty 2

## 2016-06-12 MED ORDER — FENTANYL CITRATE (PF) 100 MCG/2ML IJ SOLN
100.0000 ug | INTRAMUSCULAR | Status: DC | PRN
  Filled 2016-06-12 (×6): qty 2

## 2016-06-12 MED ORDER — TOPIRAMATE 100 MG PO TABS
100.0000 mg | ORAL_TABLET | Freq: Once | ORAL | Status: AC
  Administered 2016-06-12: 100 mg via ORAL
  Filled 2016-06-12: qty 1

## 2016-06-12 MED ORDER — SODIUM CHLORIDE 0.9 % IV SOLN
INTRAVENOUS | Status: AC
  Administered 2016-06-12 – 2016-06-13 (×3): via INTRAVENOUS

## 2016-06-12 MED ORDER — THIAMINE HCL 100 MG/ML IJ SOLN
100.0000 mg | Freq: Every day | INTRAMUSCULAR | Status: DC
  Administered 2016-06-12 – 2016-06-24 (×13): 100 mg via INTRAVENOUS
  Filled 2016-06-12: qty 2
  Filled 2016-06-12: qty 1
  Filled 2016-06-12 (×12): qty 2

## 2016-06-12 MED ORDER — DEXMEDETOMIDINE BOLUS VIA INFUSION
1.0000 ug/kg | Freq: Once | INTRAVENOUS | Status: AC
  Administered 2016-06-12: 109.8 ug via INTRAVENOUS
  Filled 2016-06-12: qty 110

## 2016-06-12 MED ORDER — ORAL CARE MOUTH RINSE
15.0000 mL | Freq: Four times a day (QID) | OROMUCOSAL | Status: DC
  Administered 2016-06-13 – 2016-06-23 (×39): 15 mL via OROMUCOSAL

## 2016-06-12 MED ORDER — FAMOTIDINE IN NACL 20-0.9 MG/50ML-% IV SOLN
20.0000 mg | Freq: Two times a day (BID) | INTRAVENOUS | Status: DC
  Administered 2016-06-12 – 2016-06-22 (×21): 20 mg via INTRAVENOUS
  Filled 2016-06-12 (×21): qty 50

## 2016-06-12 MED ORDER — MIDAZOLAM HCL 2 MG/2ML IJ SOLN
2.0000 mg | INTRAMUSCULAR | Status: DC | PRN
  Administered 2016-06-12: 2 mg via INTRAVENOUS

## 2016-06-12 MED ORDER — SODIUM CHLORIDE 0.9% FLUSH
3.0000 mL | Freq: Two times a day (BID) | INTRAVENOUS | Status: DC
  Administered 2016-06-12 – 2016-06-23 (×22): 3 mL via INTRAVENOUS

## 2016-06-12 MED ORDER — FENTANYL CITRATE (PF) 100 MCG/2ML IJ SOLN
100.0000 ug | INTRAMUSCULAR | Status: DC | PRN
  Administered 2016-06-12 – 2016-06-22 (×48): 100 ug via INTRAVENOUS
  Filled 2016-06-12 (×42): qty 2

## 2016-06-12 MED ORDER — IPRATROPIUM-ALBUTEROL 0.5-2.5 (3) MG/3ML IN SOLN: 3.0000 mL | RESPIRATORY_TRACT | Status: DC | PRN

## 2016-06-12 MED ORDER — MIDAZOLAM HCL 2 MG/2ML IJ SOLN
2.0000 mg | INTRAMUSCULAR | Status: DC | PRN
  Administered 2016-06-12: 2 mg via INTRAVENOUS
  Filled 2016-06-12: qty 2

## 2016-06-12 MED ORDER — ENOXAPARIN SODIUM 40 MG/0.4ML ~~LOC~~ SOLN
40.0000 mg | SUBCUTANEOUS | Status: DC
  Administered 2016-06-12 – 2016-06-22 (×11): 40 mg via SUBCUTANEOUS
  Filled 2016-06-12 (×11): qty 0.4

## 2016-06-12 MED ORDER — MIDAZOLAM HCL 2 MG/2ML IJ SOLN: 2.0000 mg | INTRAMUSCULAR | Status: DC | PRN

## 2016-06-12 MED ORDER — LORAZEPAM 2 MG/ML IJ SOLN
2.0000 mg | INTRAMUSCULAR | Status: DC | PRN
  Administered 2016-06-12 – 2016-06-13 (×4): 2 mg via INTRAVENOUS
  Administered 2016-06-13 (×2): 3 mg via INTRAVENOUS
  Administered 2016-06-14: 2 mg via INTRAVENOUS
  Administered 2016-06-14: 3 mg via INTRAVENOUS
  Administered 2016-06-15 – 2016-06-18 (×6): 2 mg via INTRAVENOUS
  Administered 2016-06-20 – 2016-06-22 (×7): 3 mg via INTRAVENOUS
  Administered 2016-06-22: 2 mg via INTRAVENOUS
  Filled 2016-06-12: qty 1
  Filled 2016-06-12 (×3): qty 2
  Filled 2016-06-12 (×2): qty 1
  Filled 2016-06-12: qty 2
  Filled 2016-06-12: qty 1
  Filled 2016-06-12 (×2): qty 2
  Filled 2016-06-12 (×3): qty 1
  Filled 2016-06-12: qty 2
  Filled 2016-06-12: qty 1
  Filled 2016-06-12 (×2): qty 2
  Filled 2016-06-12 (×2): qty 1
  Filled 2016-06-12 (×2): qty 2
  Filled 2016-06-12 (×2): qty 1

## 2016-06-12 MED ORDER — CHLORHEXIDINE GLUCONATE 0.12% ORAL RINSE (MEDLINE KIT)
15.0000 mL | Freq: Two times a day (BID) | OROMUCOSAL | Status: DC
  Administered 2016-06-12 – 2016-06-16 (×8): 15 mL via OROMUCOSAL

## 2016-06-12 MED ORDER — ROCURONIUM BROMIDE 50 MG/5ML IV SOLN
1.0000 mg/kg | Freq: Once | INTRAVENOUS | Status: DC
  Administered 2016-06-12: 100 mg via INTRAVENOUS

## 2016-06-12 MED ORDER — DEXMEDETOMIDINE HCL IN NACL 200 MCG/50ML IV SOLN
0.4000 ug/kg/h | INTRAVENOUS | Status: DC
  Administered 2016-06-12 (×2): 1 ug/kg/h via INTRAVENOUS
  Administered 2016-06-12: 1.2 ug/kg/h via INTRAVENOUS
  Administered 2016-06-12: 1 ug/kg/h via INTRAVENOUS
  Administered 2016-06-12: 1.2 ug/kg/h via INTRAVENOUS
  Administered 2016-06-12: 1 ug/kg/h via INTRAVENOUS
  Administered 2016-06-12: 1.2 ug/kg/h via INTRAVENOUS
  Administered 2016-06-12: 0.5 ug/kg/h via INTRAVENOUS
  Administered 2016-06-13 – 2016-06-15 (×35): 1.2 ug/kg/h via INTRAVENOUS
  Filled 2016-06-12: qty 150
  Filled 2016-06-12 (×6): qty 50
  Filled 2016-06-12: qty 100
  Filled 2016-06-12 (×4): qty 50
  Filled 2016-06-12: qty 150
  Filled 2016-06-12 (×2): qty 50
  Filled 2016-06-12: qty 150
  Filled 2016-06-12: qty 100
  Filled 2016-06-12 (×5): qty 50
  Filled 2016-06-12: qty 150
  Filled 2016-06-12 (×4): qty 50

## 2016-06-12 MED ORDER — LORAZEPAM 2 MG/ML IJ SOLN
2.0000 mg | Freq: Once | INTRAMUSCULAR | Status: AC
  Administered 2016-06-12 (×2): 2 mg via INTRAVENOUS

## 2016-06-12 MED ORDER — ONDANSETRON HCL 4 MG/2ML IJ SOLN
4.0000 mg | Freq: Four times a day (QID) | INTRAMUSCULAR | Status: DC | PRN
  Administered 2016-06-28: 4 mg via INTRAVENOUS
  Filled 2016-06-12: qty 2

## 2016-06-12 MED ORDER — SODIUM CHLORIDE 0.9 % IV SOLN
INTRAVENOUS | Status: DC
  Administered 2016-06-12 (×2): 1000 mL via INTRAVENOUS

## 2016-06-12 MED ORDER — ETOMIDATE 2 MG/ML IV SOLN
20.0000 mg | Freq: Once | INTRAVENOUS | Status: AC
  Administered 2016-06-12: 20 mg via INTRAVENOUS

## 2016-06-12 MED ORDER — ACETAMINOPHEN 325 MG PO TABS: 650.0000 mg | ORAL_TABLET | Freq: Four times a day (QID) | ORAL | Status: DC | PRN

## 2016-06-12 MED ORDER — PROPOFOL 1000 MG/100ML IV EMUL
5.0000 ug/kg/min | INTRAVENOUS | Status: DC
  Administered 2016-06-12: 20 ug/kg/min via INTRAVENOUS
  Filled 2016-06-12: qty 100

## 2016-06-12 MED ORDER — FOLIC ACID 5 MG/ML IJ SOLN
1.0000 mg | Freq: Every day | INTRAMUSCULAR | Status: DC
  Administered 2016-06-13 – 2016-06-22 (×10): 1 mg via INTRAVENOUS
  Filled 2016-06-12 (×14): qty 0.2

## 2016-06-12 MED ORDER — LEVETIRACETAM IN NACL 1000 MG/100ML IV SOLN
1000.0000 mg | Freq: Once | INTRAVENOUS | Status: AC
  Administered 2016-06-12: 1000 mg via INTRAVENOUS

## 2016-06-12 NOTE — ED Provider Notes (Signed)
[facility] DEPT Provider Note   CSN: [id number] Arrival date & time: 06/12/16  1134  By signing my name below, I, Sonum Patel, attest that this documentation has been prepared under the direction and in the presence of Merrily Pew, MD. Electronically Signed: Sonum Patel, Education administrator. 06/12/16. 11:47 AM.  History   Chief Complaint Chief Complaint  Patient presents with  . Seizures    The history is provided by the EMS personnel and the patient. The history is limited by the condition of the patient. No language interpreter was used.    LEVEL 5 Caveat: Seizures, Intoxicated  HPI Comments: [name] is a 51 y.o. male brought in by ambulance, who presents to the Emergency Department s/p 3 seizures that occurred PTA. EMS notes patient had 2 seizures prior to their arrival, had 1 while in the ambulance, and 2 while in the ED. Per EMS, patient has a history of a TBI and seizures but has run out of his Topamax. He had a beer and margarita for lunch. His last seizure episode was 3 weeks ago.   Past Medical History:  Diagnosis Date  . Arthritis   . Asthma   . Chronic neck pain   . COPD (chronic obstructive pulmonary disease) ([place])   . Injury of jawline   . Mental disorder   . Ruptured disc, cervical   . Seizures ([place])   . Torn rotator cuff     Patient Active Problem List   Diagnosis Date Noted  . Alcohol abuse with intoxication ([place]) 06/12/2016  . COPD (chronic obstructive pulmonary disease) ([place]) 06/12/2016  . History of traumatic brain injury 06/12/2016  . Seizures ([place]) 06/12/2016    History reviewed. No pertinent surgical history.     Home Medications    Prior to Admission medications   Medication Sig Start Date End Date Taking? Authorizing Provider  ALPRAZolam Duanne Moron) 1 MG tablet Take 1 mg by mouth 3 (three) times daily as needed for anxiety.   Yes Historical Provider, MD  albuterol (PROVENTIL HFA;VENTOLIN HFA) 108 (90 BASE) MCG/ACT inhaler Inhale 2 puffs into the  lungs every 6 (six) hours as needed. For shortness of breath    Historical Provider, MD  cyclobenzaprine (FLEXERIL) 10 MG tablet Take 1 tablet (10 mg total) by mouth 3 (three) times daily as needed. Patient not taking: Reported on 05/09/2016 12/15/15   Tammy Triplett, PA-C  HYDROcodone-acetaminophen (NORCO) 10-325 MG tablet Take 1 tablet by mouth every 4 (four) hours as needed. For pain Patient not taking: Reported on 05/09/2016 02/13/15   Fransico Meadow, PA-C  oxyCODONE-acetaminophen (PERCOCET/ROXICET) 5-325 MG tablet Take 1 tablet by mouth every 4 (four) hours as needed. 12/15/15   Tammy Triplett, PA-C  predniSONE (DELTASONE) 10 MG tablet Take 6 tablets day one, 5 tablets day two, 4 tablets day three, 3 tablets day four, 2 tablets day five, then 1 tablet day six 05/09/16   Evalee Jefferson, PA-C    Family History Family History  Problem Relation Age of Onset  . Heart failure Father   . Cancer Father   . Alcoholism Brother     Social History Social History  Substance Use Topics  . Smoking status: Current Some Day Smoker    Types: Cigarettes, Cigars  . Smokeless tobacco: Never Used  . Alcohol use No     Comment: "monthy"     Allergies   Oxycodone-acetaminophen; Oxycodone hcl; and Percocet [oxycodone-acetaminophen]   Review of Systems Review of Systems  Unable to perform ROS: Other  Physical Exam Updated Vital Signs BP 98/64   Pulse 85   Temp 97.3 F (36.3 C)   Resp 16   Ht _0  (1.88 m)   Wt 242 lb (109.8 kg)   SpO2 100%   BMI 31.07 kg/m   Physical Exam  Constitutional: He appears well-developed and well-nourished. No distress.  HENT:  Head: Normocephalic and atraumatic.  Eyes: Conjunctivae and EOM are normal.  Neck: Neck supple. No tracheal deviation present.  Cardiovascular: Regular rhythm.   Tachycardic  Pulmonary/Chest: Effort normal. No respiratory distress.  Musculoskeletal: Normal range of motion. He exhibits no deformity.  No obvious signs of trauma    Neurological: He is alert.  1 episode of back arching, full body shaking, unresponsive and not breathing episode that resolved without intervention. Afterwards was speaking in full sentences. Neurologically intact. Smelled of alcohol.   Skin: Skin is warm and dry.  Psychiatric: He has a normal mood and affect. His behavior is normal.  Nursing note and vitals reviewed.    ED Treatments / Results  DIAGNOSTIC STUDIES: Oxygen Saturation is 97% on RA, nml by my interpretation.      Labs (all labs ordered are listed, but only abnormal results are displayed) Labs Reviewed  RAPID URINE DRUG SCREEN, HOSP PERFORMED - Abnormal; Notable for the following:       Result Value   Benzodiazepines POSITIVE (*)    All other components within normal limits  URINALYSIS, ROUTINE W REFLEX MICROSCOPIC - Abnormal; Notable for the following:    Color, Urine COLORLESS (*)    Specific Gravity, Urine 1.003 (*)    All other components within normal limits  ETHANOL - Abnormal; Notable for the following:    Alcohol, Ethyl (B) 203 (*)    All other components within normal limits  BLOOD GAS, ARTERIAL - Abnormal; Notable for the following:    pH, Arterial 7.335 (*)    pO2, Arterial 229.00 (*)    All other components within normal limits  CBG MONITORING, ED - Abnormal; Notable for the following:    Glucose-Capillary 101 (*)    All other components within normal limits  I-STAT CHEM 8, ED - Abnormal; Notable for the following:    BUN 4 (*)    Glucose, Bld 106 (*)    Calcium, Ion 1.10 (*)    All other components within normal limits  CBC WITH DIFFERENTIAL/PLATELET  COMPREHENSIVE METABOLIC PANEL  PROTIME-INR    EKG  EKG Interpretation  Date/Time:  Tuesday Jun 12 2016 11:36:17 EDT Ventricular Rate:  94 PR Interval:    QRS Duration: 103 QT Interval:  364 QTC Calculation: 456 R Axis:   75 Text Interpretation:  Sinus rhythm Low voltage, precordial leads No significant change since last tracing Confirmed  by Trinda Harlacher MD, Corene Cornea [phone]) on 06/12/2016 4:16:10 PM       Radiology Ct Head Wo Contrast  Result Date: 06/12/2016 CLINICAL DATA:  Seizures EXAM: CT HEAD WITHOUT CONTRAST TECHNIQUE: Contiguous axial images were obtained from the base of the skull through the vertex without intravenous contrast. COMPARISON:  05/27/2011 CT FINDINGS: Brain: No evidence of acute infarction, hemorrhage, hydrocephalus, extra-axial collection or mass lesion/mass effect. Vascular: No hyperdense vessel or unexpected calcification. Skull: Normal. Negative for fracture or focal lesion. Sinuses/Orbits: No acute finding. Mild mucosal thickening of the ethmoid sinus. Small mucous retention cyst of the anterior left maxillary sinus. Symmetric appearance of orbits. Punctate calcification along the posterior aspect of the right globe at the insertion of the right optic  nerve consistent with drusen is stable. Other: Clear mastoids bilaterally. IMPRESSION: No acute intracranial abnormality. Electronically Signed   By: Ashley Royalty M.D.   On: 06/12/2016 14:22   Dg Chest Portable 1 View  Result Date: 06/12/2016 CLINICAL DATA:  Intubation. EXAM: PORTABLE CHEST 1 VIEW COMPARISON:  10/18/2010. FINDINGS: NG tube noted with its tip over the lower cervical esophagus. Repositioning suggested. Endotracheal tube tip noted approximately 5 cm above the carina. Low lung volumes. Bibasilar pulmonary infiltrates cannot be excluded. No pleural effusion or pneumothorax. Radiopacity noted over the right chest consistent with a coin. This may be outside the patient. Clinical correlation suggested. IMPRESSION: 1. NG tube noted with its tip over the lower cervical esophagus. Repositioning suggested. Endotracheal tube tip noted approximately 5 cm above the carina . 2. Low lung volumes with basilar atelectasis. Mild bibasilar infiltrates cannot be excluded. 3. Radiopacity noted over the right chest consistent with a coin. This may be extrinsic to the patient. Clinical  correlation suggested. Critical Value/emergent results were called by telephone at the time of interpretation on 06/12/2016 at 1:48 pm to Dr. Merrily Pew , who verbally acknowledged these results. Electronically Signed   By: Marcello Moores  Register   On: 06/12/2016 13:49   Dg Chest Port 1v Same Day  Result Date: 06/12/2016 CLINICAL DATA:  Endotracheal tube change out due to cuff leak. EXAM: PORTABLE CHEST 1 VIEW COMPARISON:  Portable chest x-ray of today's date FINDINGS: The endotracheal tube tip lies just below the inferior margin of the clavicular heads approximately 4.4 cm above the carina. The esophagogastric tube tip in proximal port lie in the gastric cardia. The cardiac silhouette is normal in size. The pulmonary vascularity is not engorged. The lungs remain mildly hypoinflated. The left lung base is clear dirt on this study than earlier today. There is persistent density at the right lung base. IMPRESSION: Interval replacement of the endotracheal tube with adequate radiographic positioning. Advancement of the esophagogastric tube has occurred such that the proximal port and tip are in the cardia. Electronically Signed   By: David  Martinique M.D.   On: 06/12/2016 15:47    Procedures  CRITICAL CARE Performed by: Merrily Pew Total critical care time: 45 minutes Critical care time was exclusive of separately billable procedures and treating other patients. Critical care was necessary to treat or prevent imminent or life-threatening deterioration. Critical care was time spent personally by me on the following activities: development of treatment plan with patient and/or surrogate as well as nursing, discussions with consultants, evaluation of patient's response to treatment, examination of patient, obtaining history from patient or surrogate, ordering and performing treatments and interventions, ordering and review of laboratory studies, ordering and review of radiographic studies, pulse oximetry and  re-evaluation of patient's condition.   Procedure Name: Intubation Date/Time: 06/12/2016 4:13 PM Performed by: Merrily Pew Pre-anesthesia Checklist: Patient identified, Emergency Drugs available, Suction available and Patient being monitored Oxygen Delivery Method: Simple face mask Preoxygenation: Pre-oxygenation with 100% oxygen Intubation Type: Rapid sequence Laryngoscope Size: Mac and 4 Grade View: Grade III Tube type: Subglottic suction tube Tube size: 7.5 mm Number of attempts: 1 Airway Equipment and Method: Rigid stylet Placement Confirmation: ETT inserted through vocal cords under direct vision,  Positive ETCO2 and Breath sounds checked- equal and bilateral Secured at: 23 cm Tube secured with: ETT holder Dental Injury: Teeth and Oropharynx as per pre-operative assessment  Future Recommendations: Recommend- induction with short-acting agent, and alternative techniques readily available    Procedure Name: Intubation Date/Time: 06/12/2016  4:14 PM Performed by: Merrily Pew Pre-anesthesia Checklist: Patient identified, Emergency Drugs available and Suction available Oxygen Delivery Method: Simple face mask Preoxygenation: Pre-oxygenation with 100% oxygen Intubation Type: Rapid sequence Ventilation: Mask ventilation without difficulty Laryngoscope Size: Glidescope and 4 Grade View: Grade IV Tube type: Subglottic suction tube Tube size: 8.0 mm Number of attempts: 1 Airway Equipment and Method: Rigid stylet Placement Confirmation: ETT inserted through vocal cords under direct vision Secured at: 25 cm Tube secured with: ETT holder Dental Injury: Teeth and Oropharynx as per pre-operative assessment  Future Recommendations: Recommend- induction with short-acting agent, and alternative techniques readily available      (including critical care time)  Medications Ordered in ED Medications  propofol (DIPRIVAN) 1000 MG/100ML infusion (5 mcg/kg/min  109.8 kg Intravenous  Rate/Dose Change 06/12/16 1518)  fentaNYL (SUBLIMAZE) injection 100 mcg (100 mcg Intravenous Given 06/12/16 1451)  fentaNYL (SUBLIMAZE) injection 100 mcg (not administered)  midazolam (VERSED) injection 2 mg (2 mg Intravenous Given 06/12/16 1452)  midazolam (VERSED) injection 2 mg (not administered)  0.9 %  sodium chloride infusion (1,000 mLs Intravenous New Bag/Given 06/12/16 1455)  LORazepam (ATIVAN) injection 2 mg (2 mg Intravenous Given 06/12/16 1144)  topiramate (TOPAMAX) tablet 100 mg (100 mg Oral Given 06/12/16 1155)  levETIRAcetam (KEPPRA) IVPB 1000 mg/100 mL premix (0 mg Intravenous Stopped 06/12/16 1224)  LORazepam (ATIVAN) injection 2 mg (2 mg Intravenous Given 06/12/16 1208)  LORazepam (ATIVAN) injection 2 mg (2 mg Intravenous Given 06/12/16 1224)  levETIRAcetam (KEPPRA) IVPB 1000 mg/100 mL premix (1,000 mg Intravenous New Bag/Given 06/12/16 1228)  etomidate (AMIDATE) injection 30 mg (30 mg Intravenous Given 06/12/16 1301)  rocuronium (ZEMURON) injection 109.8 mg (100 mg Intravenous Given 06/12/16 1302)  fentaNYL (SUBLIMAZE) injection 100 mcg (100 mcg Intravenous Given 06/12/16 1339)  etomidate (AMIDATE) injection 20 mg (20 mg Intravenous Given 06/12/16 1456)     Initial Impression / Assessment and Plan / ED Course  I have reviewed the triage vital signs and the nursing notes.  Pertinent labs & imaging results that were available during my care of the patient were reviewed by me and considered in my medical decision making (see chart for details).     51 year old male here with multiple back-to-back seizure-like episodes where he would start with shaking of his right leg, gagging proceeding to apnea and non-rhythmic shaking of his upper body for about 10 seconds. This would subsequently become a rhythmic shaking of his whole body for approximate 5-7 seconds during which patient is unresponsive and apneic. His blood pressure and heart rate would go up during this episode and then approximately 10 or 15  seconds afterwards he would be talking slowed and slurred but otherwise normal. This happened multiple times even after multiple doses of Ativan and 2 g of Keppra. The patient was flailing wildly and was a danger to himself and others so he was intubated and put on propofol so you get CT scan and further workup to evaluate for intracranial causes for his symptoms. Spoke with the neurologist on-call, Dr. [name], who agrees the plan and recommends an EEG which can be done in the morning. The patient was stable with some intermittent low blood pressure secondary to propofol and ET tube had to be replaced secondary to a hole in the cuff. Otherwise will plan for admission to ICU here with neuro consult.   Final Clinical Impressions(s) / ED Diagnoses   Final diagnoses:  Endotracheally intubated   I personally performed the services described in this documentation, which  was scribed in my presence. The recorded information has been reviewed and is accurate.    Merrily Pew, MD 06/12/16 1620

## 2016-06-12 NOTE — ED Notes (Signed)
Dr Clayborne Dana at bedside to change ET tube out due to leak in cuff.

## 2016-06-12 NOTE — ED Notes (Signed)
Patient continues to become more aroused. Tears noted in corners of eyes. Resp to room to suction. Diprivan titrated up. Hospitalist in room to assess patient and talk to family.

## 2016-06-12 NOTE — Plan of Care (Signed)
Problem: Education: Goal: Knowledge of [facility] Education information/materials will improve Outcome: Not Progressing PT SEDATED.UNABLE TO teach   Problem: Safety: Goal: Ability to remain free from injury will improve Outcome: Not Progressing When pt begins to wake up, he starts thrashing and flaying his arm.  Problem: Health Behavior/Discharge Planning: Goal: Ability to manage health-related needs will improve Outcome: Not Progressing Pt remains sedated for seizures prevention and safety

## 2016-06-12 NOTE — ED Notes (Signed)
Patient becoming more aroused and moving arms towards ET tube. Diprivan titrated up per order.

## 2016-06-12 NOTE — Progress Notes (Signed)
Patient was brought up from ED very agitated there were several nursing staff members that were in room holding patient down along with security. Patient is on ventilator and is now calm with Precedex. Family is at bedside and was able to answer all screening questions. Fall mats in floor, seizure pads in place as well.

## 2016-06-12 NOTE — ED Notes (Addendum)
Patient violently thrashing in bed intermittently with gazed look and halt in respirations. Episodes lasting anywhere from 5 seconds to 30-45 seconds. Patient quickly comes back to full consciousness of surroundings and continues talking to staff, alert and oriented. Patient jokes with staff intermittently before episodes start.

## 2016-06-12 NOTE — Progress Notes (Signed)
Pt extubated himself while nurse doing oral care, pt placed on 100% NRB spo2 100% rr 17 hr 76, nurse Phillips and RT will continue to monitor

## 2016-06-12 NOTE — ED Notes (Signed)
Attempting to transport patient and pt is very combative.  MD contacted and order for sedation obtained.

## 2016-06-12 NOTE — Progress Notes (Signed)
DR OPYD CALL AND NOTIFIED OF PT EXTUBATED HIMSELF. O2 SAT 99-100% ON NONREBREATHER. VSS. FAMILY MEMBERS TO STAY AT BEDSIDE. NURSING AND RESP TO MONITOR RESP STATUS .

## 2016-06-12 NOTE — ED Notes (Addendum)
Patient still violently and dangerously thrashing around on stretcher to the point of breaking equipment and trying to fall off bed despite seizure pads in place. EDP aware. Multiple staff in room. Patient verbalizes that he wants pressure applied to his right temple, that "it feels better with the pressure" Patient c/o headache. Patient also able to tell staff that he "needs to be held down because it will start again."

## 2016-06-12 NOTE — ED Notes (Signed)
During transport, prior to leaving ER patient suddenly became aroused again, thrashing in bed and pulling at all tubes. Patient kicking and trying to come out of bed again. Patient kicking against bottom of bed. Security at bedside to assist. Medications given and adjusted. ICU made aware. Possible deformities to patient's left shoulder and right wrist  Hospitalist made aware.

## 2016-06-12 NOTE — Progress Notes (Signed)
Unable to get accurate readings on ventilator due to patients agitation.

## 2016-06-12 NOTE — ED Notes (Signed)
Patient becoming more aroused and fighting staff, diprivan being titrated. Large amount of secretions from airway noted. Resp at bedside. Patient suctioned multiple times. Cuff on Airway checked with leak noted. EDP to room.

## 2016-06-12 NOTE — ED Notes (Signed)
Patient had seizure in room that lasted approximately 25-30 seconds. Dr Clayborne Dana at bedside.

## 2016-06-12 NOTE — ED Notes (Signed)
Patient thrashing on bed hitting arms against IV poles and monitors. Despite staff trying to keep patient safely in bed. Patient kicking and biting at staff. Banging head on matress and against seizure pads. Patient tried to dive out of bed head first x3 onto concrete floor. Assisted by patient's father.

## 2016-06-12 NOTE — ED Triage Notes (Addendum)
Patient brought in by EMS with complaint of seizures x 3. Per EMS, patient had witnessed seizure en route to ER. Patient given 2 mg ativan, IV prior to arrival to ER. Patient postictal at triage. States patient is out of topamax.

## 2016-06-12 NOTE — H&P (Signed)
History and Physical    [name] YQM:[id number] DOB: [birth year] DOA: 06/12/2016  PCP: Fredirick Maudlin, MD   Patient coming from: Home  Chief Complaint: Seizures   HPI: [name] is a 51 y.o. male with medical history significant for traumatic brain injury, chronic pain, seizure disorder, and COPD who presents the emergency department after suffering multiple seizures. History is obtained through discussion with the ED personnel, reviewed the EMR, and discussion with the patient's family. He had reportedly been in his usual state since getting out of jail recently, but had reportedly been out of his seizure medications which include Topamax and Depakote. He seemed to be doing well earlier in the day and went out with some friends, drink, Margarita and beer, and was then noted to have 2 episodes of generalized seizure-like activity. This prompted EMS to be called and they witnessed a third generalized seizure. 2 mg of Ativan were administered by EMS and the patient was transported to the hospital. There had not been any acute trauma reported and the patient had seemed to be in his usual state earlier in the day. He did have a seizure approximately 3 weeks ago, and per report of family, was planning to see a physician at some point in the near future for refills on his Depakote and Topamax.  ED Course: Upon arrival to the ED, patient is found to be afebrile, saturating well on room air, and with vital signs stable. He had a another episode of generalized seizure like activity in the ED with brief postictal period. He was given a oral dose of Topamax. He then went on to have another episode lasting 10-15 seconds, but followed quickly by another episode, and then multiple back-to-back episodes with patient unable to protect airway. He was intubated by the ED physician. EKG featured a normal sinus rhythm, chest x-ray was negative for acute cardiopulmonary disease and demonstrates appropriate  placement of an enteric tube and adequate positioning of ETT. Noncontrast head CT was negative for acute intracranial abnormality. Chemistry panel was unremarkable, CBC was entirely within normal limits, INR was normal, urinalysis is notable only for a low specific gravity, and ethanol level was elevated to 203. UDS is positive only for benzodiazepines which the patient is prescribed. Patient was loaded with Keppra in the ED and given Versed and propofol. He also received a liter of normal saline. Neurology was consulted by the ED physician and advised having the patient admitted here to [facility] and obtaining EEG. Patient had a soft blood pressure, likely secondary to propofol, but this has resolved and he is now hemodynamically stable. He will be admitted to the ICU for ongoing evaluation and management of seizure-like episodes with acute alcohol intoxication.  Review of Systems:  Unable to obtain ROS secondary to patient's clinical condition with chemical sedation and endotracheal intubation.  Past Medical History:  Diagnosis Date  . Arthritis   . Asthma   . Chronic neck pain   . COPD (chronic obstructive pulmonary disease) (HCC)   . Injury of jawline   . Mental disorder   . Ruptured disc, cervical   . Seizures (HCC)   . Torn rotator cuff     History reviewed. No pertinent surgical history.   reports that he has been smoking Cigarettes and Cigars.  He has never used smokeless tobacco. He reports that he does not drink alcohol or use drugs.  Allergies  Allergen Reactions  . Oxycodone-Acetaminophen     Severe rash and  itching  . Oxycodone Hcl     itching  . Percocet [Oxycodone-Acetaminophen] Other (See Comments)    itching    Family History  Problem Relation Age of Onset  . Heart failure Father   . Cancer Father   . Alcoholism Brother      Prior to Admission medications   Medication Sig Start Date End Date Taking? Authorizing Provider  ALPRAZolam Prudy Feeler) 1 MG  tablet Take 1 mg by mouth 3 (three) times daily as needed for anxiety.   Yes Historical Provider, MD  oxyCODONE-acetaminophen (PERCOCET) 10-325 MG tablet Take 1 tablet by mouth 4 (four) times daily as needed for pain.   Yes Historical Provider, MD  albuterol (PROVENTIL HFA;VENTOLIN HFA) 108 (90 BASE) MCG/ACT inhaler Inhale 2 puffs into the lungs every 6 (six) hours as needed. For shortness of breath    Historical Provider, MD    Physical Exam: Vitals:   06/12/16 1655 06/12/16 1700 06/12/16 1705 06/12/16 1715  BP: (!) 83/56 (!) 89/62 (!) 89/61 95/61  Pulse: 82 81 80 78  Resp: Temp: 97.9 F (36.6 C) 97.9 F (36.6 C) 98.1 F (36.7 C) 98.1 F (36.7 C)  TempSrc:      SpO2: 100% 100% 100% 100%  Weight:      Height:          Constitutional: Intubated, chemically sedated. Appears well-nourished, well-hydrated.  Eyes: PERTLA, lids and conjunctivae normal ENMT: Mucous membranes are moist. Posterior pharynx clear of any exudate or lesions.   Neck: normal, supple, no masses, no thyromegaly Respiratory: clear to auscultation bilaterally, no wheezing, no crackles. Mechanically ventilated.  Cardiovascular: S1 & S2 heard, regular rate and rhythm. No extremity edema. No significant JVD. Abdomen: No distension, soft, no tenderness, no masses palpated. Bowel sounds active.  Musculoskeletal: no clubbing / cyanosis. No joint deformity upper and lower extremities. Normal muscle tone.  Skin: no significant rashes, lesions, ulcers. Warm, dry, well-perfused. Neurologic: No gross facial asymmetry. Intact gag reflex. Pupils equal, round, sluggishly reactive. Moving all extremities spontaneously.  Babinski response down-going bilateraly.  Psychiatric: Difficult to assess given the clinical situation.     Labs on Admission: I have personally reviewed following labs and imaging studies  CBC:  Recent Labs Lab 06/12/16 1218 06/12/16 1551  WBC  --  8.0  NEUTROABS  --  4.1  HGB 15.3 15.5    HCT 45.0 43.9  MCV  --  93.2  PLT  --  270   Basic Metabolic Panel:  Recent Labs Lab 06/12/16 1218 06/12/16 1551  NA 145 143  K 3.8 4.0  CL 105 107  CO2  --  27  GLUCOSE 106* 104*  BUN 4* 6  CREATININE 1.10 0.82  CALCIUM  --  9.0   GFR: Estimated Creatinine Clearance: 142.1 mL/min (by C-G formula based on SCr of 0.82 mg/dL). Liver Function Tests:  Recent Labs Lab 06/12/16 1551  AST 20  ALT 20  ALKPHOS 78  BILITOT 0.3  PROT 7.6  ALBUMIN 4.1   No results for input(s): LIPASE, AMYLASE in the last 168 hours. No results for input(s): AMMONIA in the last 168 hours. Coagulation Profile:  Recent Labs Lab 06/12/16 1551  INR 0.95   Cardiac Enzymes: No results for input(s): CKTOTAL, CKMB, CKMBINDEX, TROPONINI in the last 168 hours. BNP (last 3 results) No results for input(s): PROBNP in the last 8760 hours. HbA1C: No results for input(s): HGBA1C in the last 72 hours. CBG:  Recent Labs Lab  06/12/16 1157  GLUCAP 101*   Lipid Profile: No results for input(s): CHOL, HDL, LDLCALC, TRIG, CHOLHDL, LDLDIRECT in the last 72 hours. Thyroid Function Tests: No results for input(s): TSH, T4TOTAL, FREET4, T3FREE, THYROIDAB in the last 72 hours. Anemia Panel: No results for input(s): VITAMINB12, FOLATE, FERRITIN, TIBC, IRON, RETICCTPCT in the last 72 hours. Urine analysis:    Component Value Date/Time   COLORURINE COLORLESS (A) 06/12/2016 1230   APPEARANCEUR CLEAR 06/12/2016 1230   LABSPEC 1.003 (L) 06/12/2016 1230   PHURINE 6.0 06/12/2016 1230   GLUCOSEU NEGATIVE 06/12/2016 1230   HGBUR NEGATIVE 06/12/2016 1230   BILIRUBINUR NEGATIVE 06/12/2016 1230   KETONESUR NEGATIVE 06/12/2016 1230   PROTEINUR NEGATIVE 06/12/2016 1230   UROBILINOGEN 0.2 02/04/2010 0510   NITRITE NEGATIVE 06/12/2016 1230   LEUKOCYTESUR NEGATIVE 06/12/2016 1230   Sepsis Labs: (procalcitonin:4,lacticidven:4) )No results found for this or any previous visit (from the past 240  hour(s)).   Radiological Exams on Admission: Ct Head Wo Contrast  Result Date: 06/12/2016 CLINICAL DATA:  Seizures EXAM: CT HEAD WITHOUT CONTRAST TECHNIQUE: Contiguous axial images were obtained from the base of the skull through the vertex without intravenous contrast. COMPARISON:  05/27/2011 CT FINDINGS: Brain: No evidence of acute infarction, hemorrhage, hydrocephalus, extra-axial collection or mass lesion/mass effect. Vascular: No hyperdense vessel or unexpected calcification. Skull: Normal. Negative for fracture or focal lesion. Sinuses/Orbits: No acute finding. Mild mucosal thickening of the ethmoid sinus. Small mucous retention cyst of the anterior left maxillary sinus. Symmetric appearance of orbits. Punctate calcification along the posterior aspect of the right globe at the insertion of the right optic nerve consistent with drusen is stable. Other: Clear mastoids bilaterally. IMPRESSION: No acute intracranial abnormality. Electronically Signed   By: Tollie Eth M.D.   On: 06/12/2016 14:22   Dg Chest Portable 1 View  Result Date: 06/12/2016 CLINICAL DATA:  Intubation. EXAM: PORTABLE CHEST 1 VIEW COMPARISON:  10/18/2010. FINDINGS: NG tube noted with its tip over the lower cervical esophagus. Repositioning suggested. Endotracheal tube tip noted approximately 5 cm above the carina. Low lung volumes. Bibasilar pulmonary infiltrates cannot be excluded. No pleural effusion or pneumothorax. Radiopacity noted over the right chest consistent with a coin. This may be outside the patient. Clinical correlation suggested. IMPRESSION: 1. NG tube noted with its tip over the lower cervical esophagus. Repositioning suggested. Endotracheal tube tip noted approximately 5 cm above the carina . 2. Low lung volumes with basilar atelectasis. Mild bibasilar infiltrates cannot be excluded. 3. Radiopacity noted over the right chest consistent with a coin. This may be extrinsic to the patient. Clinical correlation suggested.  Critical Value/emergent results were called by telephone at the time of interpretation on 06/12/2016 at 1:48 pm to Dr. Marily Memos , who verbally acknowledged these results. Electronically Signed   By: Maisie Fus  Register   On: 06/12/2016 13:49   Dg Chest Port 1v Same Day  Result Date: 06/12/2016 CLINICAL DATA:  Endotracheal tube change out due to cuff leak. EXAM: PORTABLE CHEST 1 VIEW COMPARISON:  Portable chest x-ray of today's date FINDINGS: The endotracheal tube tip lies just below the inferior margin of the clavicular heads approximately 4.4 cm above the carina. The esophagogastric tube tip in proximal port lie in the gastric cardia. The cardiac silhouette is normal in size. The pulmonary vascularity is not engorged. The lungs remain mildly hypoinflated. The left lung base is clear dirt on this study than earlier today. There is persistent density at the right lung base. IMPRESSION: Interval replacement of  the endotracheal tube with adequate radiographic positioning. Advancement of the esophagogastric tube has occurred such that the proximal port and tip are in the cardia. Electronically Signed   By: David  [place] M.D.   On: 06/12/2016 15:47    EKG: Independently reviewed. Normal sinus rhythm.   Assessment/Plan  1. Seizures  - Pt has hx of seizures since suffering a TBI several years; family reports that he had been taking Depakote and Topomax, but ran out; they report he had a seizure 3 wks ago and was planning to see a physician about refills on the antiepileptics before suffering a series of generalized seizure-like activity on day of admission  - He was initially alert and fully oriented in ED, had a witnessed seizure with brief post-ictal period, but then went on to have a series of generalized convulsions with hypoxia and inability to protect airway  - He was treated with Topomax in ED, loaded with Keppra, given Versed IVP's  - Head CT is negative for acute intracranial abnormality; no fevers,  labs unremarkable aside from elevated EtOH  - Neurology is consulting and much appreciated; EEG is ordered; will follow-up on additional recommendations   2. Acute respiratory failure  - Pt developed hypoxia in ED with recurrent seizures and was intubated by ED physician  - CXR demonstrates appropriate tube placement  - Vent management per PCCM   3. Alcohol abuse with acute intoxication  - Pt had been drinking with friends when seizures began  - EtOH level elevated to 203 in ED  - He has received several Versed IVP's in ED  - Monitor with CIWA and prn Ativan    4. Hx of TBI  - Head CT stable  - Pt had been off of antiepileptics as above - Neurology is consulting and much appreciated    5. COPD  - No wheezes or distress on presentation  - Continue prn nebs    6. 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

## 2016-06-12 NOTE — ED Notes (Signed)
Patient reports drinking 1 cocktail today. Strong smell of ETOH noted.

## 2016-06-13 ENCOUNTER — Inpatient Hospital Stay (HOSPITAL_COMMUNITY): Payer: Medicaid Other

## 2016-06-13 ENCOUNTER — Inpatient Hospital Stay (HOSPITAL_COMMUNITY)
Admit: 2016-06-13 | Discharge: 2016-06-13 | Disposition: A | Discharge status: Home / Self Care | Payer: Medicaid Other | Attending: Neurology | Admitting: Neurology

## 2016-06-13 LAB — HEMOGLOBIN A1C
Hgb A1c MFr Bld: 5.3 % (ref 4.8–5.6)
MEAN PLASMA GLUCOSE: 105 mg/dL

## 2016-06-13 LAB — GLUCOSE, CAPILLARY
Glucose-Capillary: 120 mg/dL — ABNORMAL HIGH (ref 65–99)
Glucose-Capillary: 125 mg/dL — ABNORMAL HIGH (ref 65–99)
Glucose-Capillary: 101 mg/dL — ABNORMAL HIGH (ref 65–99)
Glucose-Capillary: 92 mg/dL (ref 65–99)
Glucose-Capillary: 100 mg/dL — ABNORMAL HIGH (ref 65–99)

## 2016-06-13 LAB — BASIC METABOLIC PANEL
Anion gap: 7 (ref 5–15)
BUN: 8 mg/dL (ref 6–20)
CO2: 27 mmol/L (ref 22–32)
Calcium: 8.5 mg/dL — ABNORMAL LOW (ref 8.9–10.3)
Chloride: 108 mmol/L (ref 101–111)
Creatinine, Ser: 0.77 mg/dL (ref 0.61–1.24)
GFR calc Af Amer: >60 mL/min (ref >60)
GFR calc non Af Amer: >60 mL/min (ref >60)
Glucose, Bld: 123 mg/dL — ABNORMAL HIGH (ref 65–99)
Potassium: 3.7 mmol/L (ref 3.5–5.1)
Sodium: 142 mmol/L (ref 135–145)

## 2016-06-13 LAB — CBC
HEMATOCRIT: 43.3 % (ref 39.0–52.0)
HEMOGLOBIN: 15.1 g/dL (ref 13.0–17.0)
MCH: 32.5 pg (ref 26.0–34.0)
MCHC: 34.9 g/dL (ref 30.0–36.0)
MCV: 93.3 fL (ref 78.0–100.0)
Platelets: 207 10*3/uL (ref 150–400)
RBC: 4.64 MIL/uL (ref 4.22–5.81)
RDW: 12.3 % (ref 11.5–15.5)
WBC: 11.6 10*3/uL — AB (ref 4.0–10.5)

## 2016-06-13 MED ORDER — SODIUM CHLORIDE 0.9 % IV SOLN
INTRAVENOUS | Status: DC
  Administered 2016-06-13 – 2016-06-15 (×4): via INTRAVENOUS
  Administered 2016-06-15: 110 mL/h via INTRAVENOUS
  Administered 2016-06-16 – 2016-06-17 (×2): 1000 mL via INTRAVENOUS
  Administered 2016-06-18 – 2016-06-29 (×9): via INTRAVENOUS

## 2016-06-13 MED ORDER — VALPROATE SODIUM 500 MG/5ML IV SOLN
500.0000 mg | Freq: Three times a day (TID) | INTRAVENOUS | Status: DC
  Administered 2016-06-13 – 2016-06-29 (×48): 500 mg via INTRAVENOUS
  Filled 2016-06-13 (×56): qty 5

## 2016-06-13 MED ORDER — VALPROATE SODIUM 500 MG/5ML IV SOLN: 250.0000 mg | Freq: Three times a day (TID) | INTRAVENOUS | Status: DC

## 2016-06-13 MED ORDER — SODIUM CHLORIDE 0.9 % IV SOLN
500.0000 mg | Freq: Three times a day (TID) | INTRAVENOUS | Status: DC
  Administered 2016-06-13 – 2016-06-15 (×7): 500 mg via INTRAVENOUS
  Filled 2016-06-13 (×16): qty 5

## 2016-06-13 MED ORDER — PIPERACILLIN-TAZOBACTAM 3.375 G IVPB
3.3750 g | Freq: Three times a day (TID) | INTRAVENOUS | Status: DC
  Administered 2016-06-13 – 2016-06-25 (×37): 3.375 g via INTRAVENOUS
  Filled 2016-06-13 (×34): qty 50

## 2016-06-13 NOTE — Consult Note (Signed)
[place] A. [name], MD     [email or web address]          [name] is an 51 y.o. male.   ASSESSMENT/PLAN: 1. Resolved status epilepticus due to abrupt discontinuation of antiepileptic medications. The patient will be placed on maintenance Depakote and the Keppra. This will be given IV for now until we can give the patient oral medication safely. 2. Encephalopathy which is multifactorial including medication effect and the dose ictal lethargy. 3. History of traumatic brain injury.   The patient is a 52 year old white male who resents with multiple episodes of convulsions. He has a baseline history of epilepsy. He apparently has been maintained on Topamax and Depakote. Appears that he ran out a few weeks ago. He had a couple of drinks today and subsequently developed these spells of convulsions. It appears that he had multiple of these events in the emergency room and was given multiple doses of medications including Ativan. He did have moments of lucidity shortly after these events. However, because of increasing lethargy he was intubated. It appears that he self extubated himself last night in the ICU. He currently is maintained on sedatives but not intubated. He continues to have some restlessness but is doing fairly well. No recurrent spells are reported. He was given Keppra 2000 mg in the emergency room. There is a past history of traumatic brain injury.   GENERAL: No seizures observed. He is quite sedated. He is on nasal oxygen  HEENT: Supple. Atraumatic normocephalic.   ABDOMEN: soft  EXTREMITIES: No edema   BACK: Normal.  SKIN: Normal by inspection.    MENTAL STATUS: He lays in bed with eyes closed. No eye openings are achievable even with deep painful stimuli. He is snoring.  CRANIAL NERVES: Pupils small but equal, round and reactive to light; extra ocular movements appear still be intact by oculocephalic reflexes; upper and lower facial muscles are  normal in strength and symmetric, there is no flattening of the nasolabial folds. Corneal reflexes are present but diminished.  MOTOR: No movements in upper extremities. Mild withdrawal against gravity in the legs. Bulk and tone are mostly unremarkable.   COORDINATION: No tremors are appreciated. No parkinsonism appreciated.  REFLEXES: Deep tendon reflexes are symmetrical but severely diminished throughout. Babinski reflexes are equivocal bilaterally.   SENSATION: Mild response to deep painful stimuli bilaterally.    Blood pressure 126/79, pulse 75, temperature 100 F (37.8 C), resp. rate 17, height 6' 2"  (1.88 m), weight 225 lb 15.5 oz (102.5 kg), SpO2 97 %.  Past Medical History:  Diagnosis Date  . Arthritis   . Asthma   . Chronic neck pain   . COPD (chronic obstructive pulmonary disease) ([place])   . Injury of jawline   . Mental disorder   . Ruptured disc, cervical   . Seizures ([place])   . Torn rotator cuff     History reviewed. No pertinent surgical history.  Family History  Problem Relation Age of Onset  . Heart failure Father   . Cancer Father   . Alcoholism Brother     Social History:  reports that he has been smoking Cigarettes and Cigars.  He has never used smokeless tobacco. He reports that he does not drink alcohol or use drugs.  Allergies:  Allergies  Allergen Reactions  . Oxycodone-Acetaminophen     Severe rash and itching  . Oxycodone Hcl     itching  . Percocet [Oxycodone-Acetaminophen] Other (See Comments)    itching  Medications: Prior to Admission medications   Medication Sig Start Date End Date Taking? Authorizing Provider  ALPRAZolam Duanne Moron) 1 MG tablet Take 1 mg by mouth 3 (three) times daily as needed for anxiety.   Yes Historical Provider, MD  oxyCODONE-acetaminophen (PERCOCET) 10-325 MG tablet Take 1 tablet by mouth 4 (four) times daily as needed for pain.   Yes Historical Provider, MD  albuterol (PROVENTIL HFA;VENTOLIN HFA) 108 (90 BASE)  MCG/ACT inhaler Inhale 2 puffs into the lungs every 6 (six) hours as needed. For shortness of breath    Historical Provider, MD    Scheduled Meds: . chlorhexidine gluconate (MEDLINE KIT)  15 mL Mouth Rinse BID  . enoxaparin (LOVENOX) injection  40 mg Subcutaneous Q24H  . folic acid  1 mg Intravenous Daily  . insulin aspart  0-9 Units Subcutaneous Q4H  . mouth rinse  15 mL Mouth Rinse QID  . sodium chloride flush  3 mL Intravenous Q12H  . thiamine  100 mg Intravenous Daily   Continuous Infusions: . sodium chloride    . dexmedetomidine (PRECEDEX) IV infusion 1.2 mcg/kg/hr (06/13/16 0912)  . famotidine (PEPCID) IV Stopped (06/12/16 1826)  . piperacillin-tazobactam (ZOSYN)  IV     PRN Meds:.acetaminophen **OR** acetaminophen, bisacodyl, docusate, fentaNYL (SUBLIMAZE) injection, fentaNYL (SUBLIMAZE) injection, haloperidol lactate, ipratropium-albuterol, LORazepam, ondansetron **OR** ondansetron (ZOFRAN) IV     Results for orders placed or performed during the hospital encounter of 06/12/16 (from the past 48 hour(s))  CBG monitoring, ED     Status: Abnormal   Collection Time: 06/12/16 11:57 AM  Result Value Ref Range   Glucose-Capillary 101 (H) 65 - 99 mg/dL  Ethanol     Status: Abnormal   Collection Time: 06/12/16 11:59 AM  Result Value Ref Range   Alcohol, Ethyl (B) 203 (H) <5 mg/dL    Comment:        LOWEST DETECTABLE LIMIT FOR SERUM ALCOHOL IS 5 mg/dL FOR MEDICAL PURPOSES ONLY   I-stat chem 8, ed     Status: Abnormal   Collection Time: 06/12/16 12:18 PM  Result Value Ref Range   Sodium 145 135 - 145 mmol/L   Potassium 3.8 3.5 - 5.1 mmol/L   Chloride 105 101 - 111 mmol/L   BUN 4 (L) 6 - 20 mg/dL   Creatinine, Ser 1.10 0.61 - 1.24 mg/dL   Glucose, Bld 106 (H) 65 - 99 mg/dL   Calcium, Ion 1.10 (L) 1.15 - 1.40 mmol/L   TCO2 28 0 - 100 mmol/L   Hemoglobin 15.3 13.0 - 17.0 g/dL   HCT 45.0 39.0 - 52.0 %  Rapid urine drug screen (hospital performed)     Status: Abnormal    Collection Time: 06/12/16 12:30 PM  Result Value Ref Range   Opiates NONE DETECTED NONE DETECTED   Cocaine NONE DETECTED NONE DETECTED   Benzodiazepines POSITIVE (A) NONE DETECTED   Amphetamines NONE DETECTED NONE DETECTED   Tetrahydrocannabinol NONE DETECTED NONE DETECTED   Barbiturates NONE DETECTED NONE DETECTED    Comment:        DRUG SCREEN FOR MEDICAL PURPOSES ONLY.  IF CONFIRMATION IS NEEDED FOR ANY PURPOSE, NOTIFY LAB WITHIN 5 DAYS.        LOWEST DETECTABLE LIMITS FOR URINE DRUG SCREEN Drug Class       Cutoff (ng/mL) Amphetamine      1000 Barbiturate      200 Benzodiazepine   482 Tricyclics       707 Opiates  300 Cocaine          300 THC              50   Urinalysis, Routine w reflex microscopic     Status: Abnormal   Collection Time: 06/12/16 12:30 PM  Result Value Ref Range   Color, Urine COLORLESS (A) YELLOW   APPearance CLEAR CLEAR   Specific Gravity, Urine 1.003 (L) 1.005 - 1.030   pH 6.0 5.0 - 8.0   Glucose, UA NEGATIVE NEGATIVE mg/dL   Hgb urine dipstick NEGATIVE NEGATIVE   Bilirubin Urine NEGATIVE NEGATIVE   Ketones, ur NEGATIVE NEGATIVE mg/dL   Protein, ur NEGATIVE NEGATIVE mg/dL   Nitrite NEGATIVE NEGATIVE   Leukocytes, UA NEGATIVE NEGATIVE  Blood gas, arterial ([facility] & [facility] ONLY)     Status: Abnormal   Collection Time: 06/12/16  3:05 PM  Result Value Ref Range   FIO2 100.00    Delivery systems VENTILATOR    Mode PRESSURE REGULATED VOLUME CONTROL    VT 600 mL   LHR 16 resp/min   Peep/cpap 5.0 cm H20   pH, Arterial 7.335 (L) 7.350 - 7.450   pCO2 arterial 44.6 32.0 - 48.0 mmHg   pO2, Arterial 229.00 (H) 83.0 - 108.0 mmHg   Bicarbonate 22.4 20.0 - 28.0 mmol/L   Acid-base deficit 1.9 0.0 - 2.0 mmol/L   O2 Saturation 99.0 %   Collection site LEFT BRACHIAL    Drawn by [id number]    Sample type ARTERIAL    Allens test (pass/fail) PASS PASS  CBC with Differential/Platelet     Status: None   Collection Time: 06/12/16  3:51 PM  Result Value Ref Range    WBC 8.0 4.0 - 10.5 K/uL   RBC 4.71 4.22 - 5.81 MIL/uL   Hemoglobin 15.5 13.0 - 17.0 g/dL   HCT 43.9 39.0 - 52.0 %   MCV 93.2 78.0 - 100.0 fL   MCH 32.9 26.0 - 34.0 pg   MCHC 35.3 30.0 - 36.0 g/dL   RDW 12.5 11.5 - 15.5 %   Platelets 270 150 - 400 K/uL   Neutrophils Relative % 52 %   Neutro Abs 4.1 1.7 - 7.7 K/uL   Lymphocytes Relative 39 %   Lymphs Abs 3.1 0.7 - 4.0 K/uL   Monocytes Relative 6 %   Monocytes Absolute 0.5 0.1 - 1.0 K/uL   Eosinophils Relative 2 %   Eosinophils Absolute 0.2 0.0 - 0.7 K/uL   Basophils Relative 1 %   Basophils Absolute 0.1 0.0 - 0.1 K/uL  Comprehensive metabolic panel     Status: Abnormal   Collection Time: 06/12/16  3:51 PM  Result Value Ref Range   Sodium 143 135 - 145 mmol/L   Potassium 4.0 3.5 - 5.1 mmol/L   Chloride 107 101 - 111 mmol/L   CO2 27 22 - 32 mmol/L   Glucose, Bld 104 (H) 65 - 99 mg/dL   BUN 6 6 - 20 mg/dL   Creatinine, Ser 0.82 0.61 - 1.24 mg/dL   Calcium 9.0 8.9 - 10.3 mg/dL   Total Protein 7.6 6.5 - 8.1 g/dL   Albumin 4.1 3.5 - 5.0 g/dL   AST 20 15 - 41 U/L   ALT 20 17 - 63 U/L   Alkaline Phosphatase 78 38 - 126 U/L   Total Bilirubin 0.3 0.3 - 1.2 mg/dL   GFR calc non Af Amer >60 >60 mL/min   GFR calc Af Amer >60 >60 mL/min  Comment: (NOTE) The eGFR has been calculated using the CKD EPI equation. This calculation has not been validated in all clinical situations. eGFR's persistently <60 mL/min signify possible Chronic Kidney Disease.    Anion gap 9 5 - 15  Protime-INR     Status: None   Collection Time: 06/12/16  3:51 PM  Result Value Ref Range   Prothrombin Time 12.7 11.4 - 15.2 seconds   INR 0.95   Hemoglobin A1c     Status: None   Collection Time: 06/12/16  3:51 PM  Result Value Ref Range   Hgb A1c MFr Bld 5.3 4.8 - 5.6 %    Comment: (NOTE)         Pre-diabetes: 5.7 - 6.4         Diabetes: >6.4         Glycemic control for adults with diabetes: <7.0    Mean Plasma Glucose 105 mg/dL    Comment:  (NOTE) Performed At: [facility] [place], [place] [id number] Lindon Romp MD YW:[id number]   Magnesium     Status: None   Collection Time: 06/12/16  3:51 PM  Result Value Ref Range   Magnesium 2.1 1.7 - 2.4 mg/dL  MRSA PCR Screening     Status: None   Collection Time: 06/12/16  5:55 PM  Result Value Ref Range   MRSA by PCR NEGATIVE NEGATIVE    Comment:        The GeneXpert MRSA Assay (FDA approved for NASAL specimens only), is one component of a comprehensive MRSA colonization surveillance program. It is not intended to diagnose MRSA infection nor to guide or monitor treatment for MRSA infections.   Glucose, capillary     Status: Abnormal   Collection Time: 06/12/16  6:46 PM  Result Value Ref Range   Glucose-Capillary 100 (H) 65 - 99 mg/dL   Comment 1 Notify RN    Comment 2 Document in Chart   Glucose, capillary     Status: Abnormal   Collection Time: 06/12/16  7:32 PM  Result Value Ref Range   Glucose-Capillary 105 (H) 65 - 99 mg/dL  TSH     Status: None   Collection Time: 06/12/16  7:34 PM  Result Value Ref Range   TSH 1.197 0.350 - 4.500 uIU/mL    Comment: Performed by a 3rd Generation assay with a functional sensitivity of <=0.01 uIU/mL.  Glucose, capillary     Status: Abnormal   Collection Time: 06/12/16 11:48 PM  Result Value Ref Range   Glucose-Capillary 129 (H) 65 - 99 mg/dL  Glucose, capillary     Status: Abnormal   Collection Time: 06/13/16  4:15 AM  Result Value Ref Range   Glucose-Capillary 120 (H) 65 - 99 mg/dL  Basic metabolic panel     Status: Abnormal   Collection Time: 06/13/16  5:50 AM  Result Value Ref Range   Sodium 142 135 - 145 mmol/L   Potassium 3.7 3.5 - 5.1 mmol/L   Chloride 108 101 - 111 mmol/L   CO2 27 22 - 32 mmol/L   Glucose, Bld 123 (H) 65 - 99 mg/dL   BUN 8 6 - 20 mg/dL   Creatinine, Ser 0.77 0.61 - 1.24 mg/dL   Calcium 8.5 (L) 8.9 - 10.3 mg/dL   GFR calc non Af Amer >60 >60 mL/min   GFR calc Af Amer  >60 >60 mL/min    Comment: (NOTE) The eGFR has been calculated using the CKD EPI equation. This  calculation has not been validated in all clinical situations. eGFR's persistently <60 mL/min signify possible Chronic Kidney Disease.    Anion gap 7 5 - 15  CBC     Status: Abnormal   Collection Time: 06/13/16  5:50 AM  Result Value Ref Range   WBC 11.6 (H) 4.0 - 10.5 K/uL   RBC 4.64 4.22 - 5.81 MIL/uL   Hemoglobin 15.1 13.0 - 17.0 g/dL   HCT 43.3 39.0 - 52.0 %   MCV 93.3 78.0 - 100.0 fL   MCH 32.5 26.0 - 34.0 pg   MCHC 34.9 30.0 - 36.0 g/dL   RDW 12.3 11.5 - 15.5 %   Platelets 207 150 - 400 K/uL  Glucose, capillary     Status: Abnormal   Collection Time: 06/13/16  8:20 AM  Result Value Ref Range   Glucose-Capillary 125 (H) 65 - 99 mg/dL    Studies/Results:     [name], M.D.  Diplomate, Tax adviser of Psychiatry and Neurology ( Neurology). 06/13/2016, 9:23 AM

## 2016-06-13 NOTE — Progress Notes (Signed)
EEG Completed; Results Pending  

## 2016-06-13 NOTE — Progress Notes (Signed)
NO SEIZURE ACTIVITY THIS 12HR.

## 2016-06-13 NOTE — Progress Notes (Signed)
Pharmacy Antibiotic Note  [name] is a 51 y.o. male admitted on 06/12/2016 with pneumonia.  Pharmacy has been consulted for ZOSYN dosing.  Plan:  Zosyn 3.375gm IV q8h, EID Monitor labs, renal fxn, progress and c/s De[place]ate ABX when improved / appropriate.    Antimicrobials this admission: Zosyn 5/2 >>   Height:  (188 cm) Weight: 225 lb 15.5 oz (102.5 kg) IBW/kg (Calculated) : 82.2  Temp (24hrs), Avg:99.2 F (37.3 C), Min:97.2 F (36.2 C), Max:100 F (37.8 C)   Recent Labs Lab 06/12/16 1218 06/12/16 1551 06/13/16 0550  WBC  --  8.0 11.6*  CREATININE 1.10 0.82 0.77    Estimated Creatinine Clearance: 141.1 mL/min (by C-G formula based on SCr of 0.77 mg/dL).    Allergies  Allergen Reactions  . Oxycodone-Acetaminophen     Severe rash and itching  . Oxycodone Hcl     itching  . Percocet [Oxycodone-Acetaminophen] Other (See Comments)    itching   Dose adjustments this admission: Microbiology results:  BCx: pending  UCx: pending   Sputum:    MRSA PCR: negative  Thank you for allowing pharmacy to be a part of this patient's care.  Valrie Hart A 06/13/2016 12:48 PM

## 2016-06-13 NOTE — Progress Notes (Signed)
Patient had an episode where O2 saturations dropped into the low 80s. Patient was placed on nonrebreather by Britta Mccreedy, RN and RT was called. RT placed patient on bipap and he got agitated during placement and was given PRN Ativan. Patient's father was at bedside during this time and we explained what was going on, he was understanding of all of this. Currently his O2 saturations are at 94%.

## 2016-06-13 NOTE — Procedures (Signed)
  [place] A. [name], MD     [email or web address]           HISTORY: The patient is a 51 year old man who presents with multiple seizures.  MEDICATIONS: Scheduled Meds: . chlorhexidine gluconate (MEDLINE KIT)  15 mL Mouth Rinse BID  . enoxaparin (LOVENOX) injection  40 mg Subcutaneous Q24H  . folic acid  1 mg Intravenous Daily  . insulin aspart  0-9 Units Subcutaneous Q4H  . mouth rinse  15 mL Mouth Rinse QID  . sodium chloride flush  3 mL Intravenous Q12H  . thiamine  100 mg Intravenous Daily   Continuous Infusions: . sodium chloride    . dexmedetomidine (PRECEDEX) IV infusion 1.2 mcg/kg/hr (06/13/16 0912)  . famotidine (PEPCID) IV Stopped (06/12/16 1826)  . piperacillin-tazobactam (ZOSYN)  IV     PRN Meds:.acetaminophen **OR** acetaminophen, bisacodyl, docusate, fentaNYL (SUBLIMAZE) injection, fentaNYL (SUBLIMAZE) injection, haloperidol lactate, ipratropium-albuterol, LORazepam, ondansetron **OR** ondansetron (ZOFRAN) IV  Prior to Admission medications   Medication Sig Start Date End Date Taking? Authorizing Provider  ALPRAZolam Duanne Moron) 1 MG tablet Take 1 mg by mouth 3 (three) times daily as needed for anxiety.   Yes Historical Provider, MD  oxyCODONE-acetaminophen (PERCOCET) 10-325 MG tablet Take 1 tablet by mouth 4 (four) times daily as needed for pain.   Yes Historical Provider, MD  albuterol (PROVENTIL HFA;VENTOLIN HFA) 108 (90 BASE) MCG/ACT inhaler Inhale 2 puffs into the lungs every 6 (six) hours as needed. For shortness of breath    Historical Provider, MD      ANALYSIS: A 16 channel recording using standard 10 20 measurements is conducted for 21 minutes. The background activity is as high as 6 Hz. However, there is significant delta slowing observed through the recording. Additionally, spindles and K complexes are observed. Photic stimulation and hyperventilation are not carried out. There is no focal or lateral slowing. There is no epileptiform  activity is observed.   IMPRESSION: This recording of the awake and sleep states shows moderate global slowing. However, there is no epileptiform activity is observed.       Yogesh Cominsky A. [name], M.D.  Diplomate, Tax adviser of Psychiatry and Neurology ( Neurology).

## 2016-06-13 NOTE — Progress Notes (Signed)
VENT BIPAP changed out due to increased leak of 90+ and constant alarming after mask adjusted and checked tubing checked and connected. Pt asleep and resting

## 2016-06-13 NOTE — Progress Notes (Signed)
Subjective: He was admitted with status epilepticus. He's better. He was intubated for airway protection but self extubated last night. He is on Precedex at maximum dose now and still agitated. He had apparently been incarcerated for a month and then had binge drinking on the night prior to admission. He is known to have a seizure disorder and ran out of his medication but did not call my office for a refill  Objective: Vital signs in last 24 hours: Temp:  [97.2 F (36.2 C)-100 F (37.8 C)] 100 F (37.8 C) (05/02 0733) Pulse Rate:  [75-143] 75 (05/02 0810) Resp:  [14-35] 17 (05/02 0733) BP: (62-193)/(48-134) 126/79 (05/02 0700) SpO2:  [90 %-100 %] 97 % (05/02 0430) FiO2 (%):  [50 %-100 %] 60 % (05/01 1756) Weight:  [102.5 kg (225 lb 15.5 oz)-109.8 kg (242 lb)] 102.5 kg (225 lb 15.5 oz) (05/02 0400) Weight change:     Intake/Output from previous day: 05/01 0701 - 05/02 0700 In: 4248.2 [I.V.:3798.2; IV Piggyback:450] Out: 0388 [Urine:3275; Emesis/NG output:20]  PHYSICAL EXAM General appearance: Agitated confused Resp: clear to auscultation bilaterally Cardio: regular rate and rhythm, S1, S2 normal, no murmur, click, rub or gallop GI: soft, non-tender; bowel sounds normal; no masses,  no organomegaly Extremities: extremities normal, atraumatic, no cyanosis or edema Skin warm and dry  Lab Results:  Results for orders placed or performed during the hospital encounter of 06/12/16 (from the past 48 hour(s))  CBG monitoring, ED     Status: Abnormal   Collection Time: 06/12/16 11:57 AM  Result Value Ref Range   Glucose-Capillary 101 (H) 65 - 99 mg/dL  Ethanol     Status: Abnormal   Collection Time: 06/12/16 11:59 AM  Result Value Ref Range   Alcohol, Ethyl (B) 203 (H) <5 mg/dL    Comment:        LOWEST DETECTABLE LIMIT FOR SERUM ALCOHOL IS 5 mg/dL FOR MEDICAL PURPOSES ONLY   I-stat chem 8, ed     Status: Abnormal   Collection Time: 06/12/16 12:18 PM  Result Value Ref Range   Sodium 145 135 - 145 mmol/L   Potassium 3.8 3.5 - 5.1 mmol/L   Chloride 105 101 - 111 mmol/L   BUN 4 (L) 6 - 20 mg/dL   Creatinine, Ser 1.10 0.61 - 1.24 mg/dL   Glucose, Bld 106 (H) 65 - 99 mg/dL   Calcium, Ion 1.10 (L) 1.15 - 1.40 mmol/L   TCO2 28 0 - 100 mmol/L   Hemoglobin 15.3 13.0 - 17.0 g/dL   HCT 45.0 39.0 - 52.0 %  Rapid urine drug screen (hospital performed)     Status: Abnormal   Collection Time: 06/12/16 12:30 PM  Result Value Ref Range   Opiates NONE DETECTED NONE DETECTED   Cocaine NONE DETECTED NONE DETECTED   Benzodiazepines POSITIVE (A) NONE DETECTED   Amphetamines NONE DETECTED NONE DETECTED   Tetrahydrocannabinol NONE DETECTED NONE DETECTED   Barbiturates NONE DETECTED NONE DETECTED    Comment:        DRUG SCREEN FOR MEDICAL PURPOSES ONLY.  IF CONFIRMATION IS NEEDED FOR ANY PURPOSE, NOTIFY LAB WITHIN 5 DAYS.        LOWEST DETECTABLE LIMITS FOR URINE DRUG SCREEN Drug Class       Cutoff (ng/mL) Amphetamine      1000 Barbiturate      200 Benzodiazepine   828 Tricyclics       003 Opiates          300 Cocaine  300 THC              50   Urinalysis, Routine w reflex microscopic     Status: Abnormal   Collection Time: 06/12/16 12:30 PM  Result Value Ref Range   Color, Urine COLORLESS (A) YELLOW   APPearance CLEAR CLEAR   Specific Gravity, Urine 1.003 (L) 1.005 - 1.030   pH 6.0 5.0 - 8.0   Glucose, UA NEGATIVE NEGATIVE mg/dL   Hgb urine dipstick NEGATIVE NEGATIVE   Bilirubin Urine NEGATIVE NEGATIVE   Ketones, ur NEGATIVE NEGATIVE mg/dL   Protein, ur NEGATIVE NEGATIVE mg/dL   Nitrite NEGATIVE NEGATIVE   Leukocytes, UA NEGATIVE NEGATIVE  Blood gas, arterial ([facility] & [facility] ONLY)     Status: Abnormal   Collection Time: 06/12/16  3:05 PM  Result Value Ref Range   FIO2 100.00    Delivery systems VENTILATOR    Mode PRESSURE REGULATED VOLUME CONTROL    VT 600 mL   LHR 16 resp/min   Peep/cpap 5.0 cm H20   pH, Arterial 7.335 (L) 7.350 - 7.450   pCO2  arterial 44.6 32.0 - 48.0 mmHg   pO2, Arterial 229.00 (H) 83.0 - 108.0 mmHg   Bicarbonate 22.4 20.0 - 28.0 mmol/L   Acid-base deficit 1.9 0.0 - 2.0 mmol/L   O2 Saturation 99.0 %   Collection site LEFT BRACHIAL    Drawn by [id number]    Sample type ARTERIAL    Allens test (pass/fail) PASS PASS  CBC with Differential/Platelet     Status: None   Collection Time: 06/12/16  3:51 PM  Result Value Ref Range   WBC 8.0 4.0 - 10.5 K/uL   RBC 4.71 4.22 - 5.81 MIL/uL   Hemoglobin 15.5 13.0 - 17.0 g/dL   HCT 43.9 39.0 - 52.0 %   MCV 93.2 78.0 - 100.0 fL   MCH 32.9 26.0 - 34.0 pg   MCHC 35.3 30.0 - 36.0 g/dL   RDW 12.5 11.5 - 15.5 %   Platelets 270 150 - 400 K/uL   Neutrophils Relative % 52 %   Neutro Abs 4.1 1.7 - 7.7 K/uL   Lymphocytes Relative 39 %   Lymphs Abs 3.1 0.7 - 4.0 K/uL   Monocytes Relative 6 %   Monocytes Absolute 0.5 0.1 - 1.0 K/uL   Eosinophils Relative 2 %   Eosinophils Absolute 0.2 0.0 - 0.7 K/uL   Basophils Relative 1 %   Basophils Absolute 0.1 0.0 - 0.1 K/uL  Comprehensive metabolic panel     Status: Abnormal   Collection Time: 06/12/16  3:51 PM  Result Value Ref Range   Sodium 143 135 - 145 mmol/L   Potassium 4.0 3.5 - 5.1 mmol/L   Chloride 107 101 - 111 mmol/L   CO2 27 22 - 32 mmol/L   Glucose, Bld 104 (H) 65 - 99 mg/dL   BUN 6 6 - 20 mg/dL   Creatinine, Ser 0.82 0.61 - 1.24 mg/dL   Calcium 9.0 8.9 - 10.3 mg/dL   Total Protein 7.6 6.5 - 8.1 g/dL   Albumin 4.1 3.5 - 5.0 g/dL   AST 20 15 - 41 U/L   ALT 20 17 - 63 U/L   Alkaline Phosphatase 78 38 - 126 U/L   Total Bilirubin 0.3 0.3 - 1.2 mg/dL   GFR calc non Af Amer >60 >60 mL/min   GFR calc Af Amer >60 >60 mL/min    Comment: (NOTE) The eGFR has been calculated using the CKD EPI  equation. This calculation has not been validated in all clinical situations. eGFR's persistently <60 mL/min signify possible Chronic Kidney Disease.    Anion gap 9 5 - 15  Protime-INR     Status: None   Collection Time: 06/12/16   3:51 PM  Result Value Ref Range   Prothrombin Time 12.7 11.4 - 15.2 seconds   INR 0.95   Hemoglobin A1c     Status: None   Collection Time: 06/12/16  3:51 PM  Result Value Ref Range   Hgb A1c MFr Bld 5.3 4.8 - 5.6 %    Comment: (NOTE)         Pre-diabetes: 5.7 - 6.4         Diabetes: >6.4         Glycemic control for adults with diabetes: <7.0    Mean Plasma Glucose 105 mg/dL    Comment: (NOTE) Performed At: [facility] [place], [place] [id number] Lindon Romp MD [id number]   Magnesium     Status: None   Collection Time: 06/12/16  3:51 PM  Result Value Ref Range   Magnesium 2.1 1.7 - 2.4 mg/dL  MRSA PCR Screening     Status: None   Collection Time: 06/12/16  5:55 PM  Result Value Ref Range   MRSA by PCR NEGATIVE NEGATIVE    Comment:        The GeneXpert MRSA Assay (FDA approved for NASAL specimens only), is one component of a comprehensive MRSA colonization surveillance program. It is not intended to diagnose MRSA infection nor to guide or monitor treatment for MRSA infections.   Glucose, capillary     Status: Abnormal   Collection Time: 06/12/16  6:46 PM  Result Value Ref Range   Glucose-Capillary 100 (H) 65 - 99 mg/dL   Comment 1 Notify RN    Comment 2 Document in Chart   Glucose, capillary     Status: Abnormal   Collection Time: 06/12/16  7:32 PM  Result Value Ref Range   Glucose-Capillary 105 (H) 65 - 99 mg/dL  TSH     Status: None   Collection Time: 06/12/16  7:34 PM  Result Value Ref Range   TSH 1.197 0.350 - 4.500 uIU/mL    Comment: Performed by a 3rd Generation assay with a functional sensitivity of <=0.01 uIU/mL.  Glucose, capillary     Status: Abnormal   Collection Time: 06/12/16 11:48 PM  Result Value Ref Range   Glucose-Capillary 129 (H) 65 - 99 mg/dL  Glucose, capillary     Status: Abnormal   Collection Time: 06/13/16  4:15 AM  Result Value Ref Range   Glucose-Capillary 120 (H) 65 - 99 mg/dL  Basic metabolic  panel     Status: Abnormal   Collection Time: 06/13/16  5:50 AM  Result Value Ref Range   Sodium 142 135 - 145 mmol/L   Potassium 3.7 3.5 - 5.1 mmol/L   Chloride 108 101 - 111 mmol/L   CO2 27 22 - 32 mmol/L   Glucose, Bld 123 (H) 65 - 99 mg/dL   BUN 8 6 - 20 mg/dL   Creatinine, Ser 0.77 0.61 - 1.24 mg/dL   Calcium 8.5 (L) 8.9 - 10.3 mg/dL   GFR calc non Af Amer >60 >60 mL/min   GFR calc Af Amer >60 >60 mL/min    Comment: (NOTE) The eGFR has been calculated using the CKD EPI equation. This calculation has not been validated in all clinical situations. eGFR's persistently <  60 mL/min signify possible Chronic Kidney Disease.    Anion gap 7 5 - 15  CBC     Status: Abnormal   Collection Time: 06/13/16  5:50 AM  Result Value Ref Range   WBC 11.6 (H) 4.0 - 10.5 K/uL   RBC 4.64 4.22 - 5.81 MIL/uL   Hemoglobin 15.1 13.0 - 17.0 g/dL   HCT 43.3 39.0 - 52.0 %   MCV 93.3 78.0 - 100.0 fL   MCH 32.5 26.0 - 34.0 pg   MCHC 34.9 30.0 - 36.0 g/dL   RDW 12.3 11.5 - 15.5 %   Platelets 207 150 - 400 K/uL  Glucose, capillary     Status: Abnormal   Collection Time: 06/13/16  8:20 AM  Result Value Ref Range   Glucose-Capillary 125 (H) 65 - 99 mg/dL    ABGS  Recent Labs  06/12/16 1218 06/12/16 1505  PHART  --  7.335*  PO2ART  --  229.00*  TCO2 28  --   HCO3  --  22.4   CULTURES Recent Results (from the past 240 hour(s))  MRSA PCR Screening     Status: None   Collection Time: 06/12/16  5:55 PM  Result Value Ref Range Status   MRSA by PCR NEGATIVE NEGATIVE Final    Comment:        The GeneXpert MRSA Assay (FDA approved for NASAL specimens only), is one component of a comprehensive MRSA colonization surveillance program. It is not intended to diagnose MRSA infection nor to guide or monitor treatment for MRSA infections.    Studies/Results: Dg Forearm Right  Result Date: 06/13/2016 CLINICAL DATA:  Pain, combative EXAM: RIGHT FOREARM - 2 VIEW COMPARISON:  None. FINDINGS: There is  no evidence of fracture or other focal bone lesions. Soft tissues are unremarkable. IMPRESSION: Negative. Electronically Signed   By: Donavan Foil M.D.   On: 06/13/2016 03:31   Ct Head Wo Contrast  Result Date: 06/12/2016 CLINICAL DATA:  Seizures EXAM: CT HEAD WITHOUT CONTRAST TECHNIQUE: Contiguous axial images were obtained from the base of the skull through the vertex without intravenous contrast. COMPARISON:  05/27/2011 CT FINDINGS: Brain: No evidence of acute infarction, hemorrhage, hydrocephalus, extra-axial collection or mass lesion/mass effect. Vascular: No hyperdense vessel or unexpected calcification. Skull: Normal. Negative for fracture or focal lesion. Sinuses/Orbits: No acute finding. Mild mucosal thickening of the ethmoid sinus. Small mucous retention cyst of the anterior left maxillary sinus. Symmetric appearance of orbits. Punctate calcification along the posterior aspect of the right globe at the insertion of the right optic nerve consistent with drusen is stable. Other: Clear mastoids bilaterally. IMPRESSION: No acute intracranial abnormality. Electronically Signed   By: Ashley Royalty M.D.   On: 06/12/2016 14:22   Portable Chest 1 View  Result Date: 06/13/2016 CLINICAL DATA:  History of COPD and asthma EXAM: PORTABLE CHEST 1 VIEW COMPARISON:  06/12/2016 FINDINGS: Non inclusion of the right CP angle. Removal of endotracheal and esophageal tubes. The left lung is grossly clear. Hazy atelectasis or infiltrate at the right base. Stable cardiomediastinal silhouette. IMPRESSION: Removal of endotracheal and esophageal tubes. Slight increased hazy atelectasis or infiltrate at the right base Electronically Signed   By: Donavan Foil M.D.   On: 06/13/2016 03:30   Dg Chest Portable 1 View  Result Date: 06/12/2016 CLINICAL DATA:  Intubation. EXAM: PORTABLE CHEST 1 VIEW COMPARISON:  10/18/2010. FINDINGS: NG tube noted with its tip over the lower cervical esophagus. Repositioning suggested. Endotracheal  tube tip noted approximately 5 cm  above the carina. Low lung volumes. Bibasilar pulmonary infiltrates cannot be excluded. No pleural effusion or pneumothorax. Radiopacity noted over the right chest consistent with a coin. This may be outside the patient. Clinical correlation suggested. IMPRESSION: 1. NG tube noted with its tip over the lower cervical esophagus. Repositioning suggested. Endotracheal tube tip noted approximately 5 cm above the carina . 2. Low lung volumes with basilar atelectasis. Mild bibasilar infiltrates cannot be excluded. 3. Radiopacity noted over the right chest consistent with a coin. This may be extrinsic to the patient. Clinical correlation suggested. Critical Value/emergent results were called by telephone at the time of interpretation on 06/12/2016 at 1:48 pm to Dr. Merrily Pew , who verbally acknowledged these results. Electronically Signed   By: Marcello Moores  Register   On: 06/12/2016 13:49   Dg Chest Port 1v Same Day  Result Date: 06/12/2016 CLINICAL DATA:  Endotracheal tube change out due to cuff leak. EXAM: PORTABLE CHEST 1 VIEW COMPARISON:  Portable chest x-ray of today's date FINDINGS: The endotracheal tube tip lies just below the inferior margin of the clavicular heads approximately 4.4 cm above the carina. The esophagogastric tube tip in proximal port lie in the gastric cardia. The cardiac silhouette is normal in size. The pulmonary vascularity is not engorged. The lungs remain mildly hypoinflated. The left lung base is clear dirt on this study than earlier today. There is persistent density at the right lung base. IMPRESSION: Interval replacement of the endotracheal tube with adequate radiographic positioning. Advancement of the esophagogastric tube has occurred such that the proximal port and tip are in the cardia. Electronically Signed   By: David  Martinique M.D.   On: 06/12/2016 15:47   Dg Shoulder Left  Result Date: 06/13/2016 CLINICAL DATA:  Combative, history of arthritis EXAM:  LEFT SHOULDER - 2+ VIEW COMPARISON:  02/13/2015 FINDINGS: Borderline widening of the left AC joint. The left lung apex is clear. Normal positioning of the left humeral head. IMPRESSION: 1. No acute fracture. 2. Borderline to mild widening of the [facility] joint. Electronically Signed   By: Donavan Foil M.D.   On: 06/13/2016 03:32    Medications:  Prior to Admission:  Prescriptions Prior to Admission  Medication Sig Dispense Refill Last Dose  . ALPRAZolam (XANAX) 1 MG tablet Take 1 mg by mouth 3 (three) times daily as needed for anxiety.   06/12/2016 at Unknown time  . oxyCODONE-acetaminophen (PERCOCET) 10-325 MG tablet Take 1 tablet by mouth 4 (four) times daily as needed for pain.   Past Week at Unknown time  . albuterol (PROVENTIL HFA;VENTOLIN HFA) 108 (90 BASE) MCG/ACT inhaler Inhale 2 puffs into the lungs every 6 (six) hours as needed. For shortness of breath   12/14/2015 at Unknown time   Scheduled: . chlorhexidine gluconate (MEDLINE KIT)  15 mL Mouth Rinse BID  . enoxaparin (LOVENOX) injection  40 mg Subcutaneous Q24H  . folic acid  1 mg Intravenous Daily  . insulin aspart  0-9 Units Subcutaneous Q4H  . mouth rinse  15 mL Mouth Rinse QID  . sodium chloride flush  3 mL Intravenous Q12H  . thiamine  100 mg Intravenous Daily   Continuous: . dexmedetomidine (PRECEDEX) IV infusion 1.2 mcg/kg/hr (06/13/16 0809)  . famotidine (PEPCID) IV Stopped (06/12/16 1826)   XFG:[id number] **OR** acetaminophen, bisacodyl, docusate, fentaNYL (SUBLIMAZE) injection, fentaNYL (SUBLIMAZE) injection, haloperidol lactate, ipratropium-albuterol, LORazepam, ondansetron **OR** ondansetron (ZOFRAN) IV  Assesment: He was admitted with seizures. He had alcohol abuse with intoxication. At baseline he has had a  traumatic brain injury. He was intubated but has self extubated. He has what looks like aspiration pneumonia on chest x-ray that I have personally reviewed. Principal Problem:   Seizures ([place]) Active Problems:    Alcohol abuse with intoxication ([place])   COPD (chronic obstructive pulmonary disease) ([place])   History of traumatic brain injury   Endotracheally intubated   Respiratory failure ([place])    Plan: Start antibiotics. Continue Precedex. Neurology consultation.    LOS: 1 day   Judithe Keetch L 06/13/2016, 8:53 AM

## 2016-06-14 LAB — GLUCOSE, CAPILLARY
GLUCOSE-CAPILLARY: 81 mg/dL (ref 65–99)
Glucose-Capillary: 102 mg/dL — ABNORMAL HIGH (ref 65–99)
Glucose-Capillary: 109 mg/dL — ABNORMAL HIGH (ref 65–99)
Glucose-Capillary: 109 mg/dL — ABNORMAL HIGH (ref 65–99)
Glucose-Capillary: 117 mg/dL — ABNORMAL HIGH (ref 65–99)
Glucose-Capillary: 122 mg/dL — ABNORMAL HIGH (ref 65–99)
Glucose-Capillary: 128 mg/dL — ABNORMAL HIGH (ref 65–99)

## 2016-06-14 LAB — HIV ANTIBODY (ROUTINE TESTING W REFLEX): HIV SCREEN 4TH GENERATION: NONREACTIVE

## 2016-06-14 NOTE — Progress Notes (Signed)
Subjective: He is on BiPAP. He is not as responsive but is on medication for this. He has been encephalopathic. No further seizure activity. Help from neurology noted and appreciated  Objective: Vital signs in last 24 hours: Temp:  [99.9 F (37.7 C)-100.6 F (38.1 C)] 100.4 F (38 C) (05/03 0800) Pulse Rate:  [65-81] 68 (05/03 0800) Resp:  [12-41] 16 (05/03 0850) BP: (113-154)/(62-87) 113/72 (05/03 0800) SpO2:  [93 %-100 %] 95 % (05/03 0850) FiO2 (%):  [40 %] 40 % (05/03 0700) Weight:  [98.6 kg (217 lb 6 oz)] 98.6 kg (217 lb 6 oz) (05/03 0500) Weight change: -11.17 kg (-24 lb 10 oz)    Intake/Output from previous day: 05/02 0701 - 05/03 0700 In: 3193.5 [I.V.:2553.5; IV Piggyback:640] Out: 3150 [Urine:3150]  PHYSICAL EXAM General appearance: On BiPAP sleepy Resp: Some rales on the right Cardio: regular rate and rhythm, S1, S2 normal, no murmur, click, rub or gallop GI: soft, non-tender; bowel sounds normal; no masses,  no organomegaly Extremities: extremities normal, atraumatic, no cyanosis or edema Skin warm and dry  Lab Results:  Results for orders placed or performed during the hospital encounter of 06/12/16 (from the past 48 hour(s))  CBG monitoring, ED     Status: Abnormal   Collection Time: 06/12/16 11:57 AM  Result Value Ref Range   Glucose-Capillary 101 (H) 65 - 99 mg/dL  Ethanol     Status: Abnormal   Collection Time: 06/12/16 11:59 AM  Result Value Ref Range   Alcohol, Ethyl (B) 203 (H) <5 mg/dL    Comment:        LOWEST DETECTABLE LIMIT FOR SERUM ALCOHOL IS 5 mg/dL FOR MEDICAL PURPOSES ONLY   I-stat chem 8, ed     Status: Abnormal   Collection Time: 06/12/16 12:18 PM  Result Value Ref Range   Sodium 145 135 - 145 mmol/L   Potassium 3.8 3.5 - 5.1 mmol/L   Chloride 105 101 - 111 mmol/L   BUN 4 (L) 6 - 20 mg/dL   Creatinine, Ser 1.10 0.61 - 1.24 mg/dL   Glucose, Bld 106 (H) 65 - 99 mg/dL   Calcium, Ion 1.10 (L) 1.15 - 1.40 mmol/L   TCO2 28 0 - 100  mmol/L   Hemoglobin 15.3 13.0 - 17.0 g/dL   HCT 45.0 39.0 - 52.0 %  Rapid urine drug screen (hospital performed)     Status: Abnormal   Collection Time: 06/12/16 12:30 PM  Result Value Ref Range   Opiates NONE DETECTED NONE DETECTED   Cocaine NONE DETECTED NONE DETECTED   Benzodiazepines POSITIVE (A) NONE DETECTED   Amphetamines NONE DETECTED NONE DETECTED   Tetrahydrocannabinol NONE DETECTED NONE DETECTED   Barbiturates NONE DETECTED NONE DETECTED    Comment:        DRUG SCREEN FOR MEDICAL PURPOSES ONLY.  IF CONFIRMATION IS NEEDED FOR ANY PURPOSE, NOTIFY LAB WITHIN 5 DAYS.        LOWEST DETECTABLE LIMITS FOR URINE DRUG SCREEN Drug Class       Cutoff (ng/mL) Amphetamine      1000 Barbiturate      200 Benzodiazepine   161 Tricyclics       096 Opiates          300 Cocaine          300 THC              50   Urinalysis, Routine w reflex microscopic     Status: Abnormal   Collection  Time: 06/12/16 12:30 PM  Result Value Ref Range   Color, Urine COLORLESS (A) YELLOW   APPearance CLEAR CLEAR   Specific Gravity, Urine 1.003 (L) 1.005 - 1.030   pH 6.0 5.0 - 8.0   Glucose, UA NEGATIVE NEGATIVE mg/dL   Hgb urine dipstick NEGATIVE NEGATIVE   Bilirubin Urine NEGATIVE NEGATIVE   Ketones, ur NEGATIVE NEGATIVE mg/dL   Protein, ur NEGATIVE NEGATIVE mg/dL   Nitrite NEGATIVE NEGATIVE   Leukocytes, UA NEGATIVE NEGATIVE  Blood gas, arterial ([facility] & [facility] ONLY)     Status: Abnormal   Collection Time: 06/12/16  3:05 PM  Result Value Ref Range   FIO2 100.00    Delivery systems VENTILATOR    Mode PRESSURE REGULATED VOLUME CONTROL    VT 600 mL   LHR 16 resp/min   Peep/cpap 5.0 cm H20   pH, Arterial 7.335 (L) 7.350 - 7.450   pCO2 arterial 44.6 32.0 - 48.0 mmHg   pO2, Arterial 229.00 (H) 83.0 - 108.0 mmHg   Bicarbonate 22.4 20.0 - 28.0 mmol/L   Acid-base deficit 1.9 0.0 - 2.0 mmol/L   O2 Saturation 99.0 %   Collection site LEFT BRACHIAL    Drawn by [id number]    Sample type ARTERIAL    Allens  test (pass/fail) PASS PASS  CBC with Differential/Platelet     Status: None   Collection Time: 06/12/16  3:51 PM  Result Value Ref Range   WBC 8.0 4.0 - 10.5 K/uL   RBC 4.71 4.22 - 5.81 MIL/uL   Hemoglobin 15.5 13.0 - 17.0 g/dL   HCT 43.9 39.0 - 52.0 %   MCV 93.2 78.0 - 100.0 fL   MCH 32.9 26.0 - 34.0 pg   MCHC 35.3 30.0 - 36.0 g/dL   RDW 12.5 11.5 - 15.5 %   Platelets 270 150 - 400 K/uL   Neutrophils Relative % 52 %   Neutro Abs 4.1 1.7 - 7.7 K/uL   Lymphocytes Relative 39 %   Lymphs Abs 3.1 0.7 - 4.0 K/uL   Monocytes Relative 6 %   Monocytes Absolute 0.5 0.1 - 1.0 K/uL   Eosinophils Relative 2 %   Eosinophils Absolute 0.2 0.0 - 0.7 K/uL   Basophils Relative 1 %   Basophils Absolute 0.1 0.0 - 0.1 K/uL  Comprehensive metabolic panel     Status: Abnormal   Collection Time: 06/12/16  3:51 PM  Result Value Ref Range   Sodium 143 135 - 145 mmol/L   Potassium 4.0 3.5 - 5.1 mmol/L   Chloride 107 101 - 111 mmol/L   CO2 27 22 - 32 mmol/L   Glucose, Bld 104 (H) 65 - 99 mg/dL   BUN 6 6 - 20 mg/dL   Creatinine, Ser 0.82 0.61 - 1.24 mg/dL   Calcium 9.0 8.9 - 10.3 mg/dL   Total Protein 7.6 6.5 - 8.1 g/dL   Albumin 4.1 3.5 - 5.0 g/dL   AST 20 15 - 41 U/L   ALT 20 17 - 63 U/L   Alkaline Phosphatase 78 38 - 126 U/L   Total Bilirubin 0.3 0.3 - 1.2 mg/dL   GFR calc non Af Amer >60 >60 mL/min   GFR calc Af Amer >60 >60 mL/min    Comment: (NOTE) The eGFR has been calculated using the CKD EPI equation. This calculation has not been validated in all clinical situations. eGFR's persistently <60 mL/min signify possible Chronic Kidney Disease.    Anion gap 9 5 - 15  Protime-INR  Status: None   Collection Time: 06/12/16  3:51 PM  Result Value Ref Range   Prothrombin Time 12.7 11.4 - 15.2 seconds   INR 0.95   Hemoglobin A1c     Status: None   Collection Time: 06/12/16  3:51 PM  Result Value Ref Range   Hgb A1c MFr Bld 5.3 4.8 - 5.6 %    Comment: (NOTE)         Pre-diabetes: 5.7 -  6.4         Diabetes: >6.4         Glycemic control for adults with diabetes: <7.0    Mean Plasma Glucose 105 mg/dL    Comment: (NOTE) Performed At: [facility] [place], [place] [id number] Lindon Romp MD ZP:[id number]   Magnesium     Status: None   Collection Time: 06/12/16  3:51 PM  Result Value Ref Range   Magnesium 2.1 1.7 - 2.4 mg/dL  MRSA PCR Screening     Status: None   Collection Time: 06/12/16  5:55 PM  Result Value Ref Range   MRSA by PCR NEGATIVE NEGATIVE    Comment:        The GeneXpert MRSA Assay (FDA approved for NASAL specimens only), is one component of a comprehensive MRSA colonization surveillance program. It is not intended to diagnose MRSA infection nor to guide or monitor treatment for MRSA infections.   Glucose, capillary     Status: Abnormal   Collection Time: 06/12/16  6:46 PM  Result Value Ref Range   Glucose-Capillary 100 (H) 65 - 99 mg/dL   Comment 1 Notify RN    Comment 2 Document in Chart   Glucose, capillary     Status: Abnormal   Collection Time: 06/12/16  7:32 PM  Result Value Ref Range   Glucose-Capillary 105 (H) 65 - 99 mg/dL  HIV antibody (Routine Testing)     Status: None   Collection Time: 06/12/16  7:33 PM  Result Value Ref Range   HIV Screen 4th Generation wRfx Non Reactive Non Reactive    Comment: (NOTE) Performed At: [facility] [address] [place], [place] [id number] Lindon Romp MD [id number]   TSH     Status: None   Collection Time: 06/12/16  7:34 PM  Result Value Ref Range   TSH 1.197 0.350 - 4.500 uIU/mL    Comment: Performed by a 3rd Generation assay with a functional sensitivity of <=0.01 uIU/mL.  Glucose, capillary     Status: Abnormal   Collection Time: 06/12/16 11:48 PM  Result Value Ref Range   Glucose-Capillary 129 (H) 65 - 99 mg/dL  Glucose, capillary     Status: Abnormal   Collection Time: 06/13/16  4:15 AM  Result Value Ref Range   Glucose-Capillary 120  (H) 65 - 99 mg/dL  Basic metabolic panel     Status: Abnormal   Collection Time: 06/13/16  5:50 AM  Result Value Ref Range   Sodium 142 135 - 145 mmol/L   Potassium 3.7 3.5 - 5.1 mmol/L   Chloride 108 101 - 111 mmol/L   CO2 27 22 - 32 mmol/L   Glucose, Bld 123 (H) 65 - 99 mg/dL   BUN 8 6 - 20 mg/dL   Creatinine, Ser 0.77 0.61 - 1.24 mg/dL   Calcium 8.5 (L) 8.9 - 10.3 mg/dL   GFR calc non Af Amer >60 >60 mL/min   GFR calc Af Amer >60 >60 mL/min    Comment: (NOTE)  The eGFR has been calculated using the CKD EPI equation. This calculation has not been validated in all clinical situations. eGFR's persistently <60 mL/min signify possible Chronic Kidney Disease.    Anion gap 7 5 - 15  CBC     Status: Abnormal   Collection Time: 06/13/16  5:50 AM  Result Value Ref Range   WBC 11.6 (H) 4.0 - 10.5 K/uL   RBC 4.64 4.22 - 5.81 MIL/uL   Hemoglobin 15.1 13.0 - 17.0 g/dL   HCT 43.3 39.0 - 52.0 %   MCV 93.3 78.0 - 100.0 fL   MCH 32.5 26.0 - 34.0 pg   MCHC 34.9 30.0 - 36.0 g/dL   RDW 12.3 11.5 - 15.5 %   Platelets 207 150 - 400 K/uL  Glucose, capillary     Status: Abnormal   Collection Time: 06/13/16  8:20 AM  Result Value Ref Range   Glucose-Capillary 125 (H) 65 - 99 mg/dL  Glucose, capillary     Status: Abnormal   Collection Time: 06/13/16 11:24 AM  Result Value Ref Range   Glucose-Capillary 101 (H) 65 - 99 mg/dL  Glucose, capillary     Status: None   Collection Time: 06/13/16  4:38 PM  Result Value Ref Range   Glucose-Capillary 92 65 - 99 mg/dL  Glucose, capillary     Status: Abnormal   Collection Time: 06/13/16  7:44 PM  Result Value Ref Range   Glucose-Capillary 100 (H) 65 - 99 mg/dL   Comment 1 Notify RN   Glucose, capillary     Status: Abnormal   Collection Time: 06/14/16 12:08 AM  Result Value Ref Range   Glucose-Capillary 109 (H) 65 - 99 mg/dL   Comment 1 Notify RN   Glucose, capillary     Status: Abnormal   Collection Time: 06/14/16  4:29 AM  Result Value Ref Range    Glucose-Capillary 109 (H) 65 - 99 mg/dL   Comment 1 Notify RN   Glucose, capillary     Status: Abnormal   Collection Time: 06/14/16  7:27 AM  Result Value Ref Range   Glucose-Capillary 117 (H) 65 - 99 mg/dL    ABGS  Recent Labs  06/12/16 1218 06/12/16 1505  PHART  --  7.335*  PO2ART  --  229.00*  TCO2 28  --   HCO3  --  22.4   CULTURES Recent Results (from the past 240 hour(s))  MRSA PCR Screening     Status: None   Collection Time: 06/12/16  5:55 PM  Result Value Ref Range Status   MRSA by PCR NEGATIVE NEGATIVE Final    Comment:        The GeneXpert MRSA Assay (FDA approved for NASAL specimens only), is one component of a comprehensive MRSA colonization surveillance program. It is not intended to diagnose MRSA infection nor to guide or monitor treatment for MRSA infections.    Studies/Results: Dg Forearm Right  Result Date: 06/13/2016 CLINICAL DATA:  Pain, combative EXAM: RIGHT FOREARM - 2 VIEW COMPARISON:  None. FINDINGS: There is no evidence of fracture or other focal bone lesions. Soft tissues are unremarkable. IMPRESSION: Negative. Electronically Signed   By: Donavan Foil M.D.   On: 06/13/2016 03:31   Ct Head Wo Contrast  Result Date: 06/12/2016 CLINICAL DATA:  Seizures EXAM: CT HEAD WITHOUT CONTRAST TECHNIQUE: Contiguous axial images were obtained from the base of the skull through the vertex without intravenous contrast. COMPARISON:  05/27/2011 CT FINDINGS: Brain: No evidence of acute infarction, hemorrhage,  hydrocephalus, extra-axial collection or mass lesion/mass effect. Vascular: No hyperdense vessel or unexpected calcification. Skull: Normal. Negative for fracture or focal lesion. Sinuses/Orbits: No acute finding. Mild mucosal thickening of the ethmoid sinus. Small mucous retention cyst of the anterior left maxillary sinus. Symmetric appearance of orbits. Punctate calcification along the posterior aspect of the right globe at the insertion of the right optic  nerve consistent with drusen is stable. Other: Clear mastoids bilaterally. IMPRESSION: No acute intracranial abnormality. Electronically Signed   By: Ashley Royalty M.D.   On: 06/12/2016 14:22   Portable Chest 1 View  Result Date: 06/13/2016 CLINICAL DATA:  History of COPD and asthma EXAM: PORTABLE CHEST 1 VIEW COMPARISON:  06/12/2016 FINDINGS: Non inclusion of the right CP angle. Removal of endotracheal and esophageal tubes. The left lung is grossly clear. Hazy atelectasis or infiltrate at the right base. Stable cardiomediastinal silhouette. IMPRESSION: Removal of endotracheal and esophageal tubes. Slight increased hazy atelectasis or infiltrate at the right base Electronically Signed   By: Donavan Foil M.D.   On: 06/13/2016 03:30   Dg Chest Portable 1 View  Result Date: 06/12/2016 CLINICAL DATA:  Intubation. EXAM: PORTABLE CHEST 1 VIEW COMPARISON:  10/18/2010. FINDINGS: NG tube noted with its tip over the lower cervical esophagus. Repositioning suggested. Endotracheal tube tip noted approximately 5 cm above the carina. Low lung volumes. Bibasilar pulmonary infiltrates cannot be excluded. No pleural effusion or pneumothorax. Radiopacity noted over the right chest consistent with a coin. This may be outside the patient. Clinical correlation suggested. IMPRESSION: 1. NG tube noted with its tip over the lower cervical esophagus. Repositioning suggested. Endotracheal tube tip noted approximately 5 cm above the carina . 2. Low lung volumes with basilar atelectasis. Mild bibasilar infiltrates cannot be excluded. 3. Radiopacity noted over the right chest consistent with a coin. This may be extrinsic to the patient. Clinical correlation suggested. Critical Value/emergent results were called by telephone at the time of interpretation on 06/12/2016 at 1:48 pm to Dr. Merrily Pew , who verbally acknowledged these results. Electronically Signed   By: Marcello Moores  Register   On: 06/12/2016 13:49   Dg Chest Port 1v Same  Day  Result Date: 06/12/2016 CLINICAL DATA:  Endotracheal tube change out due to cuff leak. EXAM: PORTABLE CHEST 1 VIEW COMPARISON:  Portable chest x-ray of today's date FINDINGS: The endotracheal tube tip lies just below the inferior margin of the clavicular heads approximately 4.4 cm above the carina. The esophagogastric tube tip in proximal port lie in the gastric cardia. The cardiac silhouette is normal in size. The pulmonary vascularity is not engorged. The lungs remain mildly hypoinflated. The left lung base is clear dirt on this study than earlier today. There is persistent density at the right lung base. IMPRESSION: Interval replacement of the endotracheal tube with adequate radiographic positioning. Advancement of the esophagogastric tube has occurred such that the proximal port and tip are in the cardia. Electronically Signed   By: David  Martinique M.D.   On: 06/12/2016 15:47   Dg Shoulder Left  Result Date: 06/13/2016 CLINICAL DATA:  Combative, history of arthritis EXAM: LEFT SHOULDER - 2+ VIEW COMPARISON:  02/13/2015 FINDINGS: Borderline widening of the left AC joint. The left lung apex is clear. Normal positioning of the left humeral head. IMPRESSION: 1. No acute fracture. 2. Borderline to mild widening of the [facility] joint. Electronically Signed   By: Donavan Foil M.D.   On: 06/13/2016 03:32    Medications:  Prior to Admission:  Prescriptions Prior  to Admission  Medication Sig Dispense Refill Last Dose  . albuterol (PROVENTIL HFA;VENTOLIN HFA) 108 (90 BASE) MCG/ACT inhaler Inhale 2 puffs into the lungs every 6 (six) hours as needed. For shortness of breath   unknown  . ALPRAZolam (XANAX) 1 MG tablet Take 1 mg by mouth 3 (three) times daily as needed for anxiety.   06/12/2016 at Unknown time  . oxyCODONE-acetaminophen (PERCOCET) 10-325 MG tablet Take 1 tablet by mouth 4 (four) times daily as needed for pain.   Past Week at Unknown time   Scheduled: . chlorhexidine gluconate (MEDLINE KIT)  15 mL  Mouth Rinse BID  . enoxaparin (LOVENOX) injection  40 mg Subcutaneous Q24H  . folic acid  1 mg Intravenous Daily  . insulin aspart  0-9 Units Subcutaneous Q4H  . mouth rinse  15 mL Mouth Rinse QID  . sodium chloride flush  3 mL Intravenous Q12H  . thiamine  100 mg Intravenous Daily   Continuous: . sodium chloride 110 mL/hr at 06/13/16 0935  . dexmedetomidine (PRECEDEX) IV infusion 1.2 mcg/kg/hr (06/14/16 0804)  . famotidine (PEPCID) IV Stopped (06/13/16 2300)  . levETIRAcetam Stopped (06/13/16 2215)  . piperacillin-tazobactam (ZOSYN)  IV Stopped (06/14/16 0626)  . valproate sodium Stopped (06/14/16 0630)   DCV:[id number] **OR** acetaminophen, bisacodyl, docusate, fentaNYL (SUBLIMAZE) injection, fentaNYL (SUBLIMAZE) injection, haloperidol lactate, ipratropium-albuterol, LORazepam, ondansetron **OR** ondansetron (ZOFRAN) IV  Assesment: He was admitted with status epilepticus. He was intubated in the emergency room but later self extubated. He had intoxication with alcohol. He is now on BiPAP. He is still sedated. He has multifactorial encephalopathy including the post ictal state. He has right lower lobe pneumonia being treated with Zosyn Principal Problem:   Seizures ([place]) Active Problems:   Alcohol abuse with intoxication ([place])   COPD (chronic obstructive pulmonary disease) ([place])   History of traumatic brain injury   Endotracheally intubated   Respiratory failure ([place])    Plan: Continue treatments.    LOS: 2 days   Fred Hammes L 06/14/2016, 8:59 AM

## 2016-06-14 NOTE — Progress Notes (Addendum)
[place] A. [name], MD     [email or web address]          [name] is an 51 y.o. male.   ASSESSMENT/PLAN: 1. Resolved status epilepticus due to abrupt discontinuation of antiepileptic medications. No recurrent seizures on Depakote and Keppra. Level of Depakote will be obtained. I suggest that we try to wean the patient off his sedation tomorrow. 2. Encephalopathy which is multifactorial including medication effect and the dose ictal lethargy. 3. History of traumatic brain injury. 4. Suspect underlying polyneuropathy.   The patient continues to be quite sedated and is now bilevel positive pressures.     GENERAL: No seizures observed. He is quite sedated.   HEENT: Supple. Atraumatic normocephalic.   ABDOMEN: soft  EXTREMITIES: No edema   BACK: Normal.  SKIN: Normal by inspection.    MENTAL STATUS: He lays in bed with eyes closed. No eye openings are achievable even with deep painful stimuli. He is snoring.  CRANIAL NERVES: Pupils small but equal, round and reactive to light; extra ocular movements appear still be intact by oculocephalic reflexes; upper and lower facial muscles are normal in strength and symmetric, there is no flattening of the nasolabial folds. Corneal reflexes are present but diminished.  MOTOR: No movements in upper extremities. Mild withdrawal against gravity in the legs. Bulk and tone are mostly unremarkable.   COORDINATION: No tremors are appreciated. No parkinsonism appreciated.  REFLEXES: Deep tendon reflexes are symmetrical but severely diminished throughout. Babinski reflexes are equivocal bilaterally.   SENSATION: No response to deep painful stimuli bilaterally.    Blood pressure (!) 143/85, pulse 66, temperature (!) 100.9 F (38.3 C), resp. rate 14, height 6' 2"  (1.88 m), weight 217 lb 6 oz (98.6 kg), SpO2 98 %.  Past Medical History:  Diagnosis Date  . Arthritis   . Asthma   . Chronic neck pain   . COPD  (chronic obstructive pulmonary disease) ([place])   . Injury of jawline   . Mental disorder   . Ruptured disc, cervical   . Seizures ([place])   . Torn rotator cuff     History reviewed. No pertinent surgical history.  Family History  Problem Relation Age of Onset  . Heart failure Father   . Cancer Father   . Alcoholism Brother     Social History:  reports that he has been smoking Cigarettes and Cigars.  He has never used smokeless tobacco. He reports that he does not drink alcohol or use drugs.  Allergies:  Allergies  Allergen Reactions  . Oxycodone-Acetaminophen     Severe rash and itching  . Oxycodone Hcl     itching  . Percocet [Oxycodone-Acetaminophen] Other (See Comments)    itching    Medications: Prior to Admission medications   Medication Sig Start Date End Date Taking? Authorizing Provider  ALPRAZolam Duanne Moron) 1 MG tablet Take 1 mg by mouth 3 (three) times daily as needed for anxiety.   Yes Historical Provider, MD  oxyCODONE-acetaminophen (PERCOCET) 10-325 MG tablet Take 1 tablet by mouth 4 (four) times daily as needed for pain.   Yes Historical Provider, MD  albuterol (PROVENTIL HFA;VENTOLIN HFA) 108 (90 BASE) MCG/ACT inhaler Inhale 2 puffs into the lungs every 6 (six) hours as needed. For shortness of breath    Historical Provider, MD    Scheduled Meds: . chlorhexidine gluconate (MEDLINE KIT)  15 mL Mouth Rinse BID  . enoxaparin (LOVENOX) injection  40 mg Subcutaneous Q24H  . folic acid  1 mg  Intravenous Daily  . insulin aspart  0-9 Units Subcutaneous Q4H  . mouth rinse  15 mL Mouth Rinse QID  . sodium chloride flush  3 mL Intravenous Q12H  . thiamine  100 mg Intravenous Daily   Continuous Infusions: . sodium chloride 110 mL/hr at 06/14/16 1155  . dexmedetomidine (PRECEDEX) IV infusion 1.2 mcg/kg/hr (06/14/16 1658)  . famotidine (PEPCID) IV Stopped (06/14/16 1133)  . levETIRAcetam Stopped (06/14/16 1658)  . piperacillin-tazobactam (ZOSYN)  IV 3.375 g (06/14/16  1708)  . valproate sodium Stopped (06/14/16 1627)   PRN Meds:.acetaminophen **OR** acetaminophen, bisacodyl, docusate, fentaNYL (SUBLIMAZE) injection, fentaNYL (SUBLIMAZE) injection, haloperidol lactate, ipratropium-albuterol, LORazepam, ondansetron **OR** ondansetron (ZOFRAN) IV     Results for orders placed or performed during the hospital encounter of 06/12/16 (from the past 48 hour(s))  Glucose, capillary     Status: Abnormal   Collection Time: 06/12/16  6:46 PM  Result Value Ref Range   Glucose-Capillary 100 (H) 65 - 99 mg/dL   Comment 1 Notify RN    Comment 2 Document in Chart   Glucose, capillary     Status: Abnormal   Collection Time: 06/12/16  7:32 PM  Result Value Ref Range   Glucose-Capillary 105 (H) 65 - 99 mg/dL  HIV antibody (Routine Testing)     Status: None   Collection Time: 06/12/16  7:33 PM  Result Value Ref Range   HIV Screen 4th Generation wRfx Non Reactive Non Reactive    Comment: (NOTE) Performed At: [facility] [address] [place], [place] [id number] Lindon Romp MD WT:[id number]   TSH     Status: None   Collection Time: 06/12/16  7:34 PM  Result Value Ref Range   TSH 1.197 0.350 - 4.500 uIU/mL    Comment: Performed by a 3rd Generation assay with a functional sensitivity of <=0.01 uIU/mL.  Glucose, capillary     Status: Abnormal   Collection Time: 06/12/16 11:48 PM  Result Value Ref Range   Glucose-Capillary 129 (H) 65 - 99 mg/dL  Glucose, capillary     Status: Abnormal   Collection Time: 06/13/16  4:15 AM  Result Value Ref Range   Glucose-Capillary 120 (H) 65 - 99 mg/dL  Basic metabolic panel     Status: Abnormal   Collection Time: 06/13/16  5:50 AM  Result Value Ref Range   Sodium 142 135 - 145 mmol/L   Potassium 3.7 3.5 - 5.1 mmol/L   Chloride 108 101 - 111 mmol/L   CO2 27 22 - 32 mmol/L   Glucose, Bld 123 (H) 65 - 99 mg/dL   BUN 8 6 - 20 mg/dL   Creatinine, Ser 0.77 0.61 - 1.24 mg/dL   Calcium 8.5 (L) 8.9 - 10.3 mg/dL    GFR calc non Af Amer >60 >60 mL/min   GFR calc Af Amer >60 >60 mL/min    Comment: (NOTE) The eGFR has been calculated using the CKD EPI equation. This calculation has not been validated in all clinical situations. eGFR's persistently <60 mL/min signify possible Chronic Kidney Disease.    Anion gap 7 5 - 15  CBC     Status: Abnormal   Collection Time: 06/13/16  5:50 AM  Result Value Ref Range   WBC 11.6 (H) 4.0 - 10.5 K/uL   RBC 4.64 4.22 - 5.81 MIL/uL   Hemoglobin 15.1 13.0 - 17.0 g/dL   HCT 43.3 39.0 - 52.0 %   MCV 93.3 78.0 - 100.0 fL   MCH 32.5 26.0 -  34.0 pg   MCHC 34.9 30.0 - 36.0 g/dL   RDW 12.3 11.5 - 15.5 %   Platelets 207 150 - 400 K/uL  Glucose, capillary     Status: Abnormal   Collection Time: 06/13/16  8:20 AM  Result Value Ref Range   Glucose-Capillary 125 (H) 65 - 99 mg/dL  Glucose, capillary     Status: Abnormal   Collection Time: 06/13/16 11:24 AM  Result Value Ref Range   Glucose-Capillary 101 (H) 65 - 99 mg/dL  Glucose, capillary     Status: None   Collection Time: 06/13/16  4:38 PM  Result Value Ref Range   Glucose-Capillary 92 65 - 99 mg/dL  Glucose, capillary     Status: Abnormal   Collection Time: 06/13/16  7:44 PM  Result Value Ref Range   Glucose-Capillary 100 (H) 65 - 99 mg/dL   Comment 1 Notify RN   Glucose, capillary     Status: Abnormal   Collection Time: 06/14/16 12:08 AM  Result Value Ref Range   Glucose-Capillary 109 (H) 65 - 99 mg/dL   Comment 1 Notify RN   Glucose, capillary     Status: Abnormal   Collection Time: 06/14/16  4:29 AM  Result Value Ref Range   Glucose-Capillary 109 (H) 65 - 99 mg/dL   Comment 1 Notify RN   Glucose, capillary     Status: Abnormal   Collection Time: 06/14/16  7:27 AM  Result Value Ref Range   Glucose-Capillary 117 (H) 65 - 99 mg/dL  Glucose, capillary     Status: None   Collection Time: 06/14/16 11:26 AM  Result Value Ref Range   Glucose-Capillary 81 65 - 99 mg/dL  Glucose, capillary     Status:  Abnormal   Collection Time: 06/14/16  4:13 PM  Result Value Ref Range   Glucose-Capillary 102 (H) 65 - 99 mg/dL    Studies/Results:     Nicolus Ose A. [name], M.D.  Diplomate, Tax adviser of Psychiatry and Neurology ( Neurology). 06/14/2016, 6:08 PM

## 2016-06-14 NOTE — Progress Notes (Signed)
Patient Identified on Low Braden report.   Currently on Bipap and heavily sedated on Precedex. No historians at bed to receive any nutrition history from. Pt has gone 48 hrs without nutrition. Per RN, patient will hopefully be able to be awoken tomorrow and anticipate quick diet advancement. Will monitor for now.   Christophe LouisNathan Tra Wilemon RD, LDN, CNSC Clinical Nutrition Pager: [phone] 06/14/2016 11:59 AM

## 2016-06-15 DIAGNOSIS — J690 Pneumonitis due to inhalation of food and vomit: Secondary | ICD-10-CM | POA: Diagnosis present

## 2016-06-15 DIAGNOSIS — G9341 Metabolic encephalopathy: Secondary | ICD-10-CM | POA: Diagnosis present

## 2016-06-15 LAB — VALPROIC ACID LEVEL: VALPROIC ACID LVL: 70 ug/mL (ref 50.0–100.0)

## 2016-06-15 LAB — GLUCOSE, CAPILLARY
Glucose-Capillary: 129 mg/dL — ABNORMAL HIGH (ref 65–99)
Glucose-Capillary: 129 mg/dL — ABNORMAL HIGH (ref 65–99)
Glucose-Capillary: 107 mg/dL — ABNORMAL HIGH (ref 65–99)
Glucose-Capillary: 107 mg/dL — ABNORMAL HIGH (ref 65–99)
Glucose-Capillary: 95 mg/dL (ref 65–99)

## 2016-06-15 MED ORDER — DEXMEDETOMIDINE HCL IN NACL 200 MCG/50ML IV SOLN
0.4000 ug/kg/h | INTRAVENOUS | Status: DC
  Administered 2016-06-15: 1 ug/kg/h via INTRAVENOUS
  Administered 2016-06-15 (×3): 1.2 ug/kg/h via INTRAVENOUS
  Administered 2016-06-15: 1.1 ug/kg/h via INTRAVENOUS
  Administered 2016-06-15: 1.2 ug/kg/h via INTRAVENOUS
  Administered 2016-06-15 (×2): 1.1 ug/kg/h via INTRAVENOUS
  Administered 2016-06-15 – 2016-06-17 (×19): 1.2 ug/kg/h via INTRAVENOUS
  Administered 2016-06-17: 1 ug/kg/h via INTRAVENOUS
  Administered 2016-06-17 – 2016-06-19 (×32): 1.2 ug/kg/h via INTRAVENOUS
  Administered 2016-06-19: 1.199 ug/kg/h via INTRAVENOUS
  Administered 2016-06-19 (×5): 1.2 ug/kg/h via INTRAVENOUS
  Administered 2016-06-20 (×2): 0.4 ug/kg/h via INTRAVENOUS
  Administered 2016-06-20: 1.2 ug/kg/h via INTRAVENOUS
  Administered 2016-06-20 – 2016-06-21 (×6): 0.4 ug/kg/h via INTRAVENOUS
  Filled 2016-06-15 (×7): qty 50
  Filled 2016-06-15: qty 100
  Filled 2016-06-15 (×8): qty 50
  Filled 2016-06-15: qty 100
  Filled 2016-06-15: qty 50
  Filled 2016-06-15: qty 150
  Filled 2016-06-15 (×8): qty 50
  Filled 2016-06-15: qty 100
  Filled 2016-06-15 (×7): qty 50
  Filled 2016-06-15 (×2): qty 100
  Filled 2016-06-15: qty 50
  Filled 2016-06-15: qty 100
  Filled 2016-06-15: qty 150
  Filled 2016-06-15: qty 100
  Filled 2016-06-15: qty 150
  Filled 2016-06-15 (×2): qty 50
  Filled 2016-06-15: qty 100
  Filled 2016-06-15: qty 50
  Filled 2016-06-15: qty 100
  Filled 2016-06-15 (×7): qty 50
  Filled 2016-06-15: qty 150
  Filled 2016-06-15 (×3): qty 50
  Filled 2016-06-15: qty 100
  Filled 2016-06-15 (×2): qty 50

## 2016-06-15 MED ORDER — LEVETIRACETAM IN NACL 500 MG/100ML IV SOLN
500.0000 mg | Freq: Three times a day (TID) | INTRAVENOUS | Status: DC
Start: 2016-06-15 — End: 2016-06-22
  Administered 2016-06-15 – 2016-06-22 (×21): 500 mg via INTRAVENOUS
  Filled 2016-06-15 (×30): qty 100

## 2016-06-15 NOTE — Progress Notes (Signed)
[place] A. [name], MD     [email or web address]          [name] is an 51 y.o. male.   ASSESSMENT/PLAN: 1. Resolved status epilepticus due to abrupt discontinuation of antiepileptic medications. No recurrent seizures on Depakote and Keppra.  2. Encephalopathy which is multifactorial including medication effect and the dose ictal lethargy. 3. History of traumatic brain injury. 4. Suspect underlying polyneuropathy.   The patient has been weaned off [place].      GENERAL: No seizures observed. He is quite sedated.   HEENT: Supple. Atraumatic normocephalic.   ABDOMEN: soft  EXTREMITIES: No edema   BACK: Normal.  SKIN: Normal by inspection.    MENTAL STATUS: He lays in bed with eyes closed. He open his eyes with light tactile stimulation. No does not follow commands. No verbal output.  CRANIAL NERVES: Pupils small but equal, round and reactive to light; extra ocular movements appear still be intact by oculocephalic reflexes; upper and lower facial muscles are normal in strength and symmetric, there is no flattening of the nasolabial folds. Corneal reflexes are present but diminished.  MOTOR: Mild withdrawal against gravity in the arms and legs. Bulk and tone are mostly unremarkable.   COORDINATION: No tremors are appreciated. No parkinsonism appreciated.  REFLEXES: Deep tendon reflexes are symmetrical but severely diminished throughout. Babinski reflexes are equivocal bilaterally.   SENSATION: No response to deep painful stimuli bilaterally.    Blood pressure (!) 172/98, pulse 61, temperature 99.7 F (37.6 C), resp. rate 19, height 6' 2"  (1.88 m), weight 218 lb 11.1 oz (99.2 kg), SpO2 97 %.  Past Medical History:  Diagnosis Date  . Arthritis   . Asthma   . Chronic neck pain   . COPD (chronic obstructive pulmonary disease) ([place])   . Injury of jawline   . Mental disorder   . Ruptured disc, cervical   . Seizures ([place])   . Torn rotator  cuff     History reviewed. No pertinent surgical history.  Family History  Problem Relation Age of Onset  . Heart failure Father   . Cancer Father   . Alcoholism Brother     Social History:  reports that he has been smoking Cigarettes and Cigars.  He has never used smokeless tobacco. He reports that he does not drink alcohol or use drugs.  Allergies:  Allergies  Allergen Reactions  . Oxycodone-Acetaminophen     Severe rash and itching  . Oxycodone Hcl     itching  . Percocet [Oxycodone-Acetaminophen] Other (See Comments)    itching    Medications: Prior to Admission medications   Medication Sig Start Date End Date Taking? Authorizing Provider  ALPRAZolam [name]) 1 MG tablet Take 1 mg by mouth 3 (three) times daily as needed for anxiety.   Yes Historical Provider, MD  oxyCODONE-acetaminophen (PERCOCET) 10-325 MG tablet Take 1 tablet by mouth 4 (four) times daily as needed for pain.   Yes Historical Provider, MD  albuterol (PROVENTIL HFA;VENTOLIN HFA) 108 (90 BASE) MCG/ACT inhaler Inhale 2 puffs into the lungs every 6 (six) hours as needed. For shortness of breath    Historical Provider, MD    Scheduled Meds: . chlorhexidine gluconate (MEDLINE KIT)  15 mL Mouth Rinse BID  . enoxaparin (LOVENOX) injection  40 mg Subcutaneous Q24H  . folic acid  1 mg Intravenous Daily  . insulin aspart  0-9 Units Subcutaneous Q4H  . mouth rinse  15 mL Mouth Rinse QID  . sodium  chloride flush  3 mL Intravenous Q12H  . thiamine  100 mg Intravenous Daily   Continuous Infusions: . sodium chloride 110 mL/hr at 06/15/16 1322  . dexmedetomidine (PRECEDEX) IV infusion 1.2 mcg/kg/hr (06/15/16 1827)  . famotidine (PEPCID) IV Stopped (06/15/16 0933)  . levETIRAcetam Stopped (06/15/16 1527)  . piperacillin-tazobactam (ZOSYN)  IV 3.375 g (06/15/16 1714)  . valproate sodium Stopped (06/15/16 1525)   PRN Meds:.acetaminophen **OR** acetaminophen, bisacodyl, docusate, fentaNYL (SUBLIMAZE) injection,  fentaNYL (SUBLIMAZE) injection, haloperidol lactate, ipratropium-albuterol, LORazepam, ondansetron **OR** ondansetron (ZOFRAN) IV     Results for orders placed or performed during the hospital encounter of 06/12/16 (from the past 48 hour(s))  Glucose, capillary     Status: Abnormal   Collection Time: 06/13/16  7:44 PM  Result Value Ref Range   Glucose-Capillary 100 (H) 65 - 99 mg/dL   Comment 1 Notify RN   Glucose, capillary     Status: Abnormal   Collection Time: 06/14/16 12:08 AM  Result Value Ref Range   Glucose-Capillary 109 (H) 65 - 99 mg/dL   Comment 1 Notify RN   Glucose, capillary     Status: Abnormal   Collection Time: 06/14/16  4:29 AM  Result Value Ref Range   Glucose-Capillary 109 (H) 65 - 99 mg/dL   Comment 1 Notify RN   Glucose, capillary     Status: Abnormal   Collection Time: 06/14/16  7:27 AM  Result Value Ref Range   Glucose-Capillary 117 (H) 65 - 99 mg/dL  Glucose, capillary     Status: None   Collection Time: 06/14/16 11:26 AM  Result Value Ref Range   Glucose-Capillary 81 65 - 99 mg/dL  Glucose, capillary     Status: Abnormal   Collection Time: 06/14/16  4:13 PM  Result Value Ref Range   Glucose-Capillary 102 (H) 65 - 99 mg/dL  Glucose, capillary     Status: Abnormal   Collection Time: 06/14/16  7:26 PM  Result Value Ref Range   Glucose-Capillary 128 (H) 65 - 99 mg/dL   Comment 1 Notify RN   Glucose, capillary     Status: Abnormal   Collection Time: 06/14/16 11:48 PM  Result Value Ref Range   Glucose-Capillary 122 (H) 65 - 99 mg/dL   Comment 1 Notify RN   Glucose, capillary     Status: Abnormal   Collection Time: 06/15/16  3:48 AM  Result Value Ref Range   Glucose-Capillary 129 (H) 65 - 99 mg/dL   Comment 1 Notify RN   Glucose, capillary     Status: Abnormal   Collection Time: 06/15/16  7:33 AM  Result Value Ref Range   Glucose-Capillary 129 (H) 65 - 99 mg/dL  Glucose, capillary     Status: Abnormal   Collection Time: 06/15/16 11:23 AM    Result Value Ref Range   Glucose-Capillary 107 (H) 65 - 99 mg/dL  Glucose, capillary     Status: Abnormal   Collection Time: 06/15/16  3:08 PM  Result Value Ref Range   Glucose-Capillary 107 (H) 65 - 99 mg/dL  Valproic acid level     Status: None   Collection Time: 06/15/16  5:43 PM  Result Value Ref Range   Valproic Acid Lvl 70 50.0 - 100.0 ug/mL    Studies/Results:     Giovanni Biby A. Merlene Morris, M.D.  Diplomate, Tax adviser of Psychiatry and Neurology ( Neurology). 06/15/2016, 6:48 PM

## 2016-06-15 NOTE — Progress Notes (Signed)
Patient removed his own BiPAP mask. He appears in no respiratory distress, SpO2 95%, RR19 sedation was decreased to minimal dosage per RN. RT will continue to monitor.

## 2016-06-15 NOTE — Progress Notes (Signed)
Pt seen as follow up for low braden.   Pt still unable to eat. He has been weaned off Bipap. Has diet order in placed when precedex is weaned. Seems to be making progress towards resumption of PO intake.     Christophe LouisNathan Halcyon Heck RD, LDN, CNSC Clinical Nutrition Pager: [phone] 06/15/2016 2:35 PM

## 2016-06-15 NOTE — Progress Notes (Signed)
Subjective: He remains sedated and on BiPAP. He apparently had some agitation last night. No other new problems noted. No seizures.  Objective: Vital signs in last 24 hours: Temp:  [99.9 F (37.7 C)-101.8 F (38.8 C)] 99.9 F (37.7 C) (05/04 0700) Pulse Rate:  [58-71] 58 (05/04 0700) Resp:  [12-20] 13 (05/04 0700) BP: (112-148)/(69-97) 135/89 (05/04 0700) SpO2:  [93 %-100 %] 100 % (05/04 0700) Weight:  [99.2 kg (218 lb 11.1 oz)] 99.2 kg (218 lb 11.1 oz) (05/04 0600) Weight change: 0.6 kg (1 lb 5.2 oz) Last BM Date:  (unknown)  Intake/Output from previous day: 05/03 0701 - 05/04 0700 In: 1644.6 [I.V.:1384.6; IV Piggyback:260] Out: 1650 [Urine:1650]  PHYSICAL EXAM General appearance: Sedated. On BiPAP. Resp: clear to auscultation bilaterally Cardio: regular rate and rhythm, S1, S2 normal, no murmur, click, rub or gallop GI: soft, non-tender; bowel sounds normal; no masses,  no organomegaly Extremities: extremities normal, atraumatic, no cyanosis or edema Skin warm and dry  Lab Results:  Results for orders placed or performed during the hospital encounter of 06/12/16 (from the past 48 hour(s))  Glucose, capillary     Status: Abnormal   Collection Time: 06/13/16  8:20 AM  Result Value Ref Range   Glucose-Capillary 125 (H) 65 - 99 mg/dL  Glucose, capillary     Status: Abnormal   Collection Time: 06/13/16 11:24 AM  Result Value Ref Range   Glucose-Capillary 101 (H) 65 - 99 mg/dL  Glucose, capillary     Status: None   Collection Time: 06/13/16  4:38 PM  Result Value Ref Range   Glucose-Capillary 92 65 - 99 mg/dL  Glucose, capillary     Status: Abnormal   Collection Time: 06/13/16  7:44 PM  Result Value Ref Range   Glucose-Capillary 100 (H) 65 - 99 mg/dL   Comment 1 Notify RN   Glucose, capillary     Status: Abnormal   Collection Time: 06/14/16 12:08 AM  Result Value Ref Range   Glucose-Capillary 109 (H) 65 - 99 mg/dL   Comment 1 Notify RN   Glucose, capillary      Status: Abnormal   Collection Time: 06/14/16  4:29 AM  Result Value Ref Range   Glucose-Capillary 109 (H) 65 - 99 mg/dL   Comment 1 Notify RN   Glucose, capillary     Status: Abnormal   Collection Time: 06/14/16  7:27 AM  Result Value Ref Range   Glucose-Capillary 117 (H) 65 - 99 mg/dL  Glucose, capillary     Status: None   Collection Time: 06/14/16 11:26 AM  Result Value Ref Range   Glucose-Capillary 81 65 - 99 mg/dL  Glucose, capillary     Status: Abnormal   Collection Time: 06/14/16  4:13 PM  Result Value Ref Range   Glucose-Capillary 102 (H) 65 - 99 mg/dL  Glucose, capillary     Status: Abnormal   Collection Time: 06/14/16  7:26 PM  Result Value Ref Range   Glucose-Capillary 128 (H) 65 - 99 mg/dL   Comment 1 Notify RN   Glucose, capillary     Status: Abnormal   Collection Time: 06/14/16 11:48 PM  Result Value Ref Range   Glucose-Capillary 122 (H) 65 - 99 mg/dL   Comment 1 Notify RN   Glucose, capillary     Status: Abnormal   Collection Time: 06/15/16  3:48 AM  Result Value Ref Range   Glucose-Capillary 129 (H) 65 - 99 mg/dL   Comment 1 Notify RN   Glucose,  capillary     Status: Abnormal   Collection Time: 06/15/16  7:33 AM  Result Value Ref Range   Glucose-Capillary 129 (H) 65 - 99 mg/dL    ABGS  Recent Labs  06/12/16 1218 06/12/16 1505  PHART  --  7.335*  PO2ART  --  229.00*  TCO2 28  --   HCO3  --  22.4   CULTURES Recent Results (from the past 240 hour(s))  MRSA PCR Screening     Status: None   Collection Time: 06/12/16  5:55 PM  Result Value Ref Range Status   MRSA by PCR NEGATIVE NEGATIVE Final    Comment:        The GeneXpert MRSA Assay (FDA approved for NASAL specimens only), is one component of a comprehensive MRSA colonization surveillance program. It is not intended to diagnose MRSA infection nor to guide or monitor treatment for MRSA infections.    Studies/Results: No results found.  Medications:  Prior to Admission:  Prescriptions  Prior to Admission  Medication Sig Dispense Refill Last Dose  . albuterol (PROVENTIL HFA;VENTOLIN HFA) 108 (90 BASE) MCG/ACT inhaler Inhale 2 puffs into the lungs every 6 (six) hours as needed. For shortness of breath   unknown  . ALPRAZolam (XANAX) 1 MG tablet Take 1 mg by mouth 3 (three) times daily as needed for anxiety.   06/12/2016 at Unknown time  . oxyCODONE-acetaminophen (PERCOCET) 10-325 MG tablet Take 1 tablet by mouth 4 (four) times daily as needed for pain.   Past Week at Unknown time   Scheduled: . chlorhexidine gluconate (MEDLINE KIT)  15 mL Mouth Rinse BID  . enoxaparin (LOVENOX) injection  40 mg Subcutaneous Q24H  . folic acid  1 mg Intravenous Daily  . insulin aspart  0-9 Units Subcutaneous Q4H  . mouth rinse  15 mL Mouth Rinse QID  . sodium chloride flush  3 mL Intravenous Q12H  . thiamine  100 mg Intravenous Daily   Continuous: . sodium chloride 110 mL/hr at 06/15/16 0233  . dexmedetomidine (PRECEDEX) IV infusion    . famotidine (PEPCID) IV Stopped (06/14/16 2330)  . levETIRAcetam Stopped (06/14/16 2315)  . piperacillin-tazobactam (ZOSYN)  IV 3.375 g (06/15/16 0230)  . valproate sodium 500 mg (06/15/16 0533)   KAJ:[id number] **OR** acetaminophen, bisacodyl, docusate, fentaNYL (SUBLIMAZE) injection, fentaNYL (SUBLIMAZE) injection, haloperidol lactate, ipratropium-albuterol, LORazepam, ondansetron **OR** ondansetron (ZOFRAN) IV  Assesment: He came in with seizures/status epilepticus. He was initially intubated and later self extubated. He has not had any seizures in the last 48 hours. He has been on Precedex because of agitation. He was alcohol intoxicated on admission but apparently has been in jail and not been able to have any alcohol for the last month. He may not be high risk for withdrawal considering that. We may be dealing more with encephalopathy following his seizures. He is known to have a traumatic brain injury. He does have a history of alcoholism. He has  aspiration pneumonia which is being treated Principal Problem:   Seizures ([place]) Active Problems:   Alcohol abuse with intoxication ([place])   COPD (chronic obstructive pulmonary disease) ([place])   History of traumatic brain injury   Endotracheally intubated   Respiratory failure ([place])    Plan: Attempt to reduce his sedation today. See if we can get him off of BiPAP.    LOS: 3 days   Calise Dunckel L 06/15/2016, 7:50 AM

## 2016-06-16 LAB — GLUCOSE, CAPILLARY
GLUCOSE-CAPILLARY: 109 mg/dL — AB (ref 65–99)
GLUCOSE-CAPILLARY: 89 mg/dL (ref 65–99)
GLUCOSE-CAPILLARY: 92 mg/dL (ref 65–99)
GLUCOSE-CAPILLARY: 90 mg/dL (ref 65–99)
GLUCOSE-CAPILLARY: 87 mg/dL (ref 65–99)
GLUCOSE-CAPILLARY: 79 mg/dL (ref 65–99)

## 2016-06-16 MED ORDER — VALPROATE SODIUM 500 MG/5ML IV SOLN
INTRAVENOUS | Status: AC
  Filled 2016-06-16: qty 5

## 2016-06-16 MED ORDER — DEXMEDETOMIDINE HCL IN NACL 200 MCG/50ML IV SOLN: 0.4000 ug/kg/h | INTRAVENOUS | Status: DC

## 2016-06-16 NOTE — Progress Notes (Signed)
Pharmacy Antibiotic Note  [name] is a 51 y.o. male admitted on 06/12/2016 with aspiration pneumonia.  Pharmacy has been consulted for ZOSYN dosing. Patient is still sedated and on Bipap. Afebrile. D#4 of zosyn. F/U with plan to reduce sedation and wean from BiPAP.   Plan:  Continue Zosyn 3.375gm IV q8h, EID Monitor labs, renal fxn, progress and c/s [place] ABX when improved / appropriate.    Antimicrobials this admission: Zosyn 5/2 >>   Height: 6\' 2"  (188 cm) Weight: 205 lb 7.5 oz (93.2 kg) IBW/kg (Calculated) : 82.2  Temp (24hrs), Avg:99.9 F (37.7 C), Min:97.8 F (36.6 C), Max:100.4 F (38 C)   Recent Labs Lab 06/12/16 1218 06/12/16 1551 06/13/16 0550  WBC  --  8.0 11.6*  CREATININE 1.10 0.82 0.77    Estimated Creatinine Clearance: 128.4 mL/min (by C-G formula based on SCr of 0.77 mg/dL).    Allergies  Allergen Reactions  . Oxycodone-Acetaminophen     Severe rash and itching  . Oxycodone Hcl     itching  . Percocet [Oxycodone-Acetaminophen] Other (See Comments)    itching   Dose adjustments this admission: Microbiology results:  5/1MRSA PCR: negative  Thank you for allowing pharmacy to be a part of this patient's care.  Elder CyphersLorie Melbourne Jakubiak, BS Pharm D, BCPS Clinical Pharmacist Pager [phone] 06/16/2016 1:27 PM

## 2016-06-16 NOTE — Progress Notes (Signed)
Subjective: Patient is resting. He remained confused and sedated. No seizure reported during the night   Objective: Vital signs in last 24 hours: Temp:  [97.8 F (36.6 C)-100.4 F (38 C)] 100 F (37.8 C) (05/05 0600) Pulse Rate:  [58-75] 69 (05/05 0600) Resp:  [11-27] 22 (05/05 0600) BP: (132-176)/(76-106) 134/93 (05/05 0600) SpO2:  [92 %-100 %] 94 % (05/05 0600) Weight:  [93.2 kg (205 lb 7.5 oz)] 93.2 kg (205 lb 7.5 oz) (05/05 0400) Weight change: -6 kg (-13 lb 3.6 oz) Last BM Date:  (unknown)  Intake/Output from previous day: 05/04 0701 - 05/05 0700 In: 6372.1 [I.V.:5722.1; IV Piggyback:650] Out: 5875 [Urine:5875]  PHYSICAL EXAM General appearance: alert and no distress Resp: clear to auscultation bilaterally Cardio: S1, S2 normal GI: soft, non-tender; bowel sounds normal; no masses,  no organomegaly Extremities: extremities normal, atraumatic, no cyanosis or edema  Lab Results:  Results for orders placed or performed during the hospital encounter of 06/12/16 (from the past 48 hour(s))  Glucose, capillary     Status: None   Collection Time: 06/14/16 11:26 AM  Result Value Ref Range   Glucose-Capillary 81 65 - 99 mg/dL  Glucose, capillary     Status: Abnormal   Collection Time: 06/14/16  4:13 PM  Result Value Ref Range   Glucose-Capillary 102 (H) 65 - 99 mg/dL  Glucose, capillary     Status: Abnormal   Collection Time: 06/14/16  7:26 PM  Result Value Ref Range   Glucose-Capillary 128 (H) 65 - 99 mg/dL   Comment 1 Notify RN   Glucose, capillary     Status: Abnormal   Collection Time: 06/14/16 11:48 PM  Result Value Ref Range   Glucose-Capillary 122 (H) 65 - 99 mg/dL   Comment 1 Notify RN   Glucose, capillary     Status: Abnormal   Collection Time: 06/15/16  3:48 AM  Result Value Ref Range   Glucose-Capillary 129 (H) 65 - 99 mg/dL   Comment 1 Notify RN   Glucose, capillary     Status: Abnormal   Collection Time: 06/15/16  7:33 AM  Result Value Ref Range   Glucose-Capillary 129 (H) 65 - 99 mg/dL  Glucose, capillary     Status: Abnormal   Collection Time: 06/15/16 11:23 AM  Result Value Ref Range   Glucose-Capillary 107 (H) 65 - 99 mg/dL  Glucose, capillary     Status: Abnormal   Collection Time: 06/15/16  3:08 PM  Result Value Ref Range   Glucose-Capillary 107 (H) 65 - 99 mg/dL  Valproic acid level     Status: None   Collection Time: 06/15/16  5:43 PM  Result Value Ref Range   Valproic Acid Lvl 70 50.0 - 100.0 ug/mL  Glucose, capillary     Status: None   Collection Time: 06/15/16  7:53 PM  Result Value Ref Range   Glucose-Capillary 95 65 - 99 mg/dL  Glucose, capillary     Status: Abnormal   Collection Time: 06/16/16  1:04 AM  Result Value Ref Range   Glucose-Capillary 109 (H) 65 - 99 mg/dL   Comment 1 Notify RN    Comment 2 Document in Chart   Glucose, capillary     Status: None   Collection Time: 06/16/16  4:51 AM  Result Value Ref Range   Glucose-Capillary 89 65 - 99 mg/dL   Comment 1 Notify RN    Comment 2 Document in Chart   Glucose, capillary     Status: None  Collection Time: 06/16/16  7:27 AM  Result Value Ref Range   Glucose-Capillary 92 65 - 99 mg/dL    ABGS No results for input(s): PHART, PO2ART, TCO2, HCO3 in the last 72 hours.  Invalid input(s): PCO2 CULTURES Recent Results (from the past 240 hour(s))  MRSA PCR Screening     Status: None   Collection Time: 06/12/16  5:55 PM  Result Value Ref Range Status   MRSA by PCR NEGATIVE NEGATIVE Final    Comment:        The GeneXpert MRSA Assay (FDA approved for NASAL specimens only), is one component of a comprehensive MRSA colonization surveillance program. It is not intended to diagnose MRSA infection nor to guide or monitor treatment for MRSA infections.    Studies/Results: No results found.  Medications: I have reviewed the patient's current medications.  Assesment:  Principal Problem:   Seizures (HCC) Active Problems:   Alcohol abuse with  intoxication (HCC)   COPD (chronic obstructive pulmonary disease) (HCC)   History of traumatic brain injury   Endotracheally intubated   Respiratory failure (HCC)   Aspiration pneumonia (HCC)   Metabolic encephalopathy    Plan:  Medications reviewed Continue current seizure medications  As per neurology recommendation BIPAP as needed supportive car    LOS: 4 days   Wiliam Cauthorn 06/16/2016, 8:16 AM

## 2016-06-17 LAB — GLUCOSE, CAPILLARY
GLUCOSE-CAPILLARY: 81 mg/dL (ref 65–99)
GLUCOSE-CAPILLARY: 88 mg/dL (ref 65–99)
GLUCOSE-CAPILLARY: 99 mg/dL (ref 65–99)
Glucose-Capillary: 81 mg/dL (ref 65–99)
Glucose-Capillary: 82 mg/dL (ref 65–99)
Glucose-Capillary: 87 mg/dL (ref 65–99)

## 2016-06-17 MED ORDER — CHLORHEXIDINE GLUCONATE 0.12 % MT SOLN
15.0000 mL | Freq: Two times a day (BID) | OROMUCOSAL | Status: DC
  Administered 2016-06-17 – 2016-06-22 (×12): 15 mL via OROMUCOSAL
  Filled 2016-06-17 (×9): qty 15

## 2016-06-17 MED ORDER — DEXMEDETOMIDINE HCL IN NACL 200 MCG/50ML IV SOLN
INTRAVENOUS | Status: AC
  Filled 2016-06-17: qty 100

## 2016-06-17 NOTE — Progress Notes (Signed)
Subjective: Patient remained sedated and wrist restrained. He become combative when he wakes up.  Objective: Vital signs in last 24 hours: Temp:  [98.6 F (37 C)-100.2 F (37.9 C)] 98.6 F (37 C) (05/06 0700) Pulse Rate:  [60-71] 60 (05/06 0700) Resp:  [8-26] 12 (05/06 0700) BP: (108-162)/(71-94) 154/94 (05/06 0700) SpO2:  [91 %-100 %] 96 % (05/06 0751) FiO2 (%):  [28 %] 28 % (05/05 1100) Weight:  [96.3 kg (212 lb 4.9 oz)] 96.3 kg (212 lb 4.9 oz) (05/06 0500) Weight change: 3.1 kg (6 lb 13.4 oz) Last BM Date:  (unknown)  Intake/Output from previous day: 05/05 0701 - 05/06 0700 In: 3181.1 [I.V.:2481.1; IV Piggyback:700] Out: 1750 [Urine:1750]  PHYSICAL EXAM General appearance: alert and no distress Resp: clear to auscultation bilaterally Cardio: S1, S2 normal GI: soft, non-tender; bowel sounds normal; no masses,  no organomegaly Extremities: extremities normal, atraumatic, no cyanosis or edema  Lab Results:  Results for orders placed or performed during the hospital encounter of 06/12/16 (from the past 48 hour(s))  Glucose, capillary     Status: Abnormal   Collection Time: 06/15/16 11:23 AM  Result Value Ref Range   Glucose-Capillary 107 (H) 65 - 99 mg/dL  Glucose, capillary     Status: Abnormal   Collection Time: 06/15/16  3:08 PM  Result Value Ref Range   Glucose-Capillary 107 (H) 65 - 99 mg/dL  Valproic acid level     Status: None   Collection Time: 06/15/16  5:43 PM  Result Value Ref Range   Valproic Acid Lvl 70 50.0 - 100.0 ug/mL  Glucose, capillary     Status: None   Collection Time: 06/15/16  7:53 PM  Result Value Ref Range   Glucose-Capillary 95 65 - 99 mg/dL  Glucose, capillary     Status: Abnormal   Collection Time: 06/16/16  1:04 AM  Result Value Ref Range   Glucose-Capillary 109 (H) 65 - 99 mg/dL   Comment 1 Notify RN    Comment 2 Document in Chart   Glucose, capillary     Status: None   Collection Time: 06/16/16  4:51 AM  Result Value Ref Range   Glucose-Capillary 89 65 - 99 mg/dL   Comment 1 Notify RN    Comment 2 Document in Chart   Glucose, capillary     Status: None   Collection Time: 06/16/16  7:27 AM  Result Value Ref Range   Glucose-Capillary 92 65 - 99 mg/dL  Glucose, capillary     Status: None   Collection Time: 06/16/16 11:39 AM  Result Value Ref Range   Glucose-Capillary 90 65 - 99 mg/dL  Glucose, capillary     Status: None   Collection Time: 06/16/16  4:33 PM  Result Value Ref Range   Glucose-Capillary 87 65 - 99 mg/dL  Glucose, capillary     Status: None   Collection Time: 06/16/16  8:31 PM  Result Value Ref Range   Glucose-Capillary 79 65 - 99 mg/dL  Glucose, capillary     Status: None   Collection Time: 06/17/16 12:13 AM  Result Value Ref Range   Glucose-Capillary 82 65 - 99 mg/dL  Glucose, capillary     Status: None   Collection Time: 06/17/16  3:50 AM  Result Value Ref Range   Glucose-Capillary 81 65 - 99 mg/dL  Glucose, capillary     Status: None   Collection Time: 06/17/16  7:47 AM  Result Value Ref Range   Glucose-Capillary 88 65 - 99  mg/dL   Comment 1 Notify RN    Comment 2 Document in Chart     ABGS No results for input(s): PHART, PO2ART, TCO2, HCO3 in the last 72 hours.  Invalid input(s): PCO2 CULTURES Recent Results (from the past 240 hour(s))  MRSA PCR Screening     Status: None   Collection Time: 06/12/16  5:55 PM  Result Value Ref Range Status   MRSA by PCR NEGATIVE NEGATIVE Final    Comment:        The GeneXpert MRSA Assay (FDA approved for NASAL specimens only), is one component of a comprehensive MRSA colonization surveillance program. It is not intended to diagnose MRSA infection nor to guide or monitor treatment for MRSA infections.    Studies/Results: No results found.  Medications: I have reviewed the patient's current medications.  Assesment:  Principal Problem:   Seizures (HCC) Active Problems:   Alcohol abuse with intoxication (HCC)   COPD (chronic  obstructive pulmonary disease) (HCC)   History of traumatic brain injury   Endotracheally intubated   Respiratory failure (HCC)   Aspiration pneumonia (HCC)   Metabolic encephalopathy    Plan:  Medications reviewed Continue current seizure medications  As per neurology recommendation BIPAP as needed supportive care    LOS: 5 days   [name] 06/17/2016, 10:04 AM

## 2016-06-17 NOTE — Progress Notes (Signed)
Attempted to decrease sedation and awaken patient. Became very combative trying to get out of bed. During waking could tell patient was hallucinating saying someone was after him and trying to kill him. Started precedex at full ordered strength and gave fentanyl went back to sleep.

## 2016-06-18 LAB — GLUCOSE, CAPILLARY
GLUCOSE-CAPILLARY: 82 mg/dL (ref 65–99)
GLUCOSE-CAPILLARY: 87 mg/dL (ref 65–99)
GLUCOSE-CAPILLARY: 94 mg/dL (ref 65–99)
Glucose-Capillary: 83 mg/dL (ref 65–99)
Glucose-Capillary: 85 mg/dL (ref 65–99)
Glucose-Capillary: 85 mg/dL (ref 65–99)
Glucose-Capillary: 92 mg/dL (ref 65–99)

## 2016-06-18 MED ORDER — HALOPERIDOL LACTATE 5 MG/ML IJ SOLN
2.0000 mg | Freq: Two times a day (BID) | INTRAMUSCULAR | Status: DC
  Administered 2016-06-18 (×2): 2 mg via INTRAMUSCULAR
  Filled 2016-06-18 (×2): qty 1

## 2016-06-18 MED ORDER — HALOPERIDOL 2 MG PO TABS: 2.0000 mg | ORAL_TABLET | Freq: Two times a day (BID) | ORAL | Status: DC

## 2016-06-18 NOTE — Progress Notes (Signed)
[facility] Clifford Gagan A. Gerilyn Pilgrimoonquah, MD     [email or web address]          [name] is an 51 y.o. male.   ASSESSMENT/PLAN: 1. Resolved status epilepticus due to abrupt discontinuation of antiepileptic medications. No recurrent seizures on Depakote and Keppra.  2. Encephalopathy which is multifactorial including medication effect and the dose ictal lethargy. He is on significant sedation. The patient will be started on haloperidol for behavioral control and hopefully we can wean the patient off the sedation. 3. History of traumatic brain injury. 4. Suspect underlying polyneuropathy.      The patient remains off CPAP. However, attempted weans off sedatives causes significant belligerent behavior and agitation.     GENERAL: No seizures observed. He is quite sedated.   HEENT: Supple. Atraumatic normocephalic.   ABDOMEN: soft  EXTREMITIES: No edema   BACK: Normal.  SKIN: Normal by inspection.    MENTAL STATUS: He lays in bed with eyes closed. He open his eyes with light tactile stimulation. No does not follow commands. No verbal output.  CRANIAL NERVES: Pupils small but equal, round and reactive to light; extra ocular movements appear still be intact by oculocephalic reflexes; upper and lower facial muscles are normal in strength and symmetric, there is no flattening of the nasolabial folds. Corneal reflexes are present but diminished.  MOTOR: Mild withdrawal against gravity in the arms and legs. Bulk and tone are mostly unremarkable.   COORDINATION: No tremors are appreciated. No parkinsonism appreciated.  REFLEXES: Deep tendon reflexes are symmetrical but severely diminished throughout. Babinski reflexes are equivocal bilaterally.   SENSATION: No response to deep painful stimuli bilaterally.    Blood pressure 132/87, pulse 66, temperature 97.7 F (36.5 C), temperature source Core (Comment), resp. rate 13, height 6\' 2"  (1.88 m), weight 212 lb 4.9 oz (96.3 kg),  SpO2 98 %.  Past Medical History:  Diagnosis Date  . Arthritis   . Asthma   . Chronic neck pain   . COPD (chronic obstructive pulmonary disease) (HCC)   . Injury of jawline   . Mental disorder   . Ruptured disc, cervical   . Seizures (HCC)   . Torn rotator cuff     History reviewed. No pertinent surgical history.  Family History  Problem Relation Age of Onset  . Heart failure Father   . Cancer Father   . Alcoholism Brother     Social History:  reports that he has been smoking Cigarettes and Cigars.  He has never used smokeless tobacco. He reports that he does not drink alcohol or use drugs.  Allergies:  Allergies  Allergen Reactions  . Oxycodone-Acetaminophen     Severe rash and itching  . Oxycodone Hcl     itching  . Percocet [Oxycodone-Acetaminophen] Other (See Comments)    itching    Medications: Prior to Admission medications   Medication Sig Start Date End Date Taking? Authorizing Provider  ALPRAZolam Prudy Feeler(XANAX) 1 MG tablet Take 1 mg by mouth 3 (three) times daily as needed for anxiety.   Yes Historical Provider, MD  oxyCODONE-acetaminophen (PERCOCET) 10-325 MG tablet Take 1 tablet by mouth 4 (four) times daily as needed for pain.   Yes Historical Provider, MD  albuterol (PROVENTIL HFA;VENTOLIN HFA) 108 (90 BASE) MCG/ACT inhaler Inhale 2 puffs into the lungs every 6 (six) hours as needed. For shortness of breath    Historical Provider, MD    Scheduled Meds: . chlorhexidine  15 mL Mouth/Throat BID  . enoxaparin (LOVENOX) injection  40 mg Subcutaneous Q24H  . folic acid  1 mg Intravenous Daily  . insulin aspart  0-9 Units Subcutaneous Q4H  . mouth rinse  15 mL Mouth Rinse QID  . sodium chloride flush  3 mL Intravenous Q12H  . thiamine  100 mg Intravenous Daily   Continuous Infusions: . sodium chloride 1,000 mL (06/17/16 1820)  . dexmedetomidine (PRECEDEX) IV infusion 1.2 mcg/kg/hr (06/18/16 0651)  . famotidine (PEPCID) IV Stopped (06/17/16 2141)  .  levETIRAcetam Stopped (06/17/16 2126)  . piperacillin-tazobactam (ZOSYN)  IV Stopped (06/18/16 0521)  . valproate sodium Stopped (06/18/16 0612)   PRN Meds:.acetaminophen **OR** acetaminophen, bisacodyl, docusate, fentaNYL (SUBLIMAZE) injection, fentaNYL (SUBLIMAZE) injection, haloperidol lactate, ipratropium-albuterol, LORazepam, ondansetron **OR** ondansetron (ZOFRAN) IV     Results for orders placed or performed during the hospital encounter of 06/12/16 (from the past 48 hour(s))  Glucose, capillary     Status: None   Collection Time: 06/16/16 11:39 AM  Result Value Ref Range   Glucose-Capillary 90 65 - 99 mg/dL  Glucose, capillary     Status: None   Collection Time: 06/16/16  4:33 PM  Result Value Ref Range   Glucose-Capillary 87 65 - 99 mg/dL  Glucose, capillary     Status: None   Collection Time: 06/16/16  8:31 PM  Result Value Ref Range   Glucose-Capillary 79 65 - 99 mg/dL  Glucose, capillary     Status: None   Collection Time: 06/17/16 12:13 AM  Result Value Ref Range   Glucose-Capillary 82 65 - 99 mg/dL  Glucose, capillary     Status: None   Collection Time: 06/17/16  3:50 AM  Result Value Ref Range   Glucose-Capillary 81 65 - 99 mg/dL  Glucose, capillary     Status: None   Collection Time: 06/17/16  7:47 AM  Result Value Ref Range   Glucose-Capillary 88 65 - 99 mg/dL   Comment 1 Notify RN    Comment 2 Document in Chart   Glucose, capillary     Status: None   Collection Time: 06/17/16 11:19 AM  Result Value Ref Range   Glucose-Capillary 99 65 - 99 mg/dL  Glucose, capillary     Status: None   Collection Time: 06/17/16  4:14 PM  Result Value Ref Range   Glucose-Capillary 81 65 - 99 mg/dL  Glucose, capillary     Status: None   Collection Time: 06/17/16  7:40 PM  Result Value Ref Range   Glucose-Capillary 87 65 - 99 mg/dL  Glucose, capillary     Status: None   Collection Time: 06/18/16 12:45 AM  Result Value Ref Range   Glucose-Capillary 82 65 - 99 mg/dL    Glucose, capillary     Status: None   Collection Time: 06/18/16  5:19 AM  Result Value Ref Range   Glucose-Capillary 92 65 - 99 mg/dL  Glucose, capillary     Status: None   Collection Time: 06/18/16  7:27 AM  Result Value Ref Range   Glucose-Capillary 83 65 - 99 mg/dL    Studies/Results:     [name] A. Gerilyn Pilgrim, M.D.  Diplomate, Biomedical engineer of Psychiatry and Neurology ( Neurology). 06/18/2016, 8:08 AM

## 2016-06-18 NOTE — Progress Notes (Signed)
Subjective: He is still very encephalopathic. Attempts have been made to reduce his sedation and he has become combative with that. He just had some medication so he is pretty sedated now. He was better last night according to nursing staff so we may be able to make some progress with reducing sedation today.  Objective: Vital signs in last 24 hours: Temp:  [97.2 F (36.2 C)-99.7 F (37.6 C)] 97.7 F (36.5 C) (05/07 0700) Pulse Rate:  [56-85] 66 (05/07 0700) Resp:  [7-28] 13 (05/07 0700) BP: (98-160)/(69-101) 132/87 (05/07 0700) SpO2:  [93 %-99 %] 98 % (05/07 0700) FiO2 (%):  [36 %] 36 % (05/06 1000) Weight:  [96.3 kg (212 lb 4.9 oz)] 96.3 kg (212 lb 4.9 oz) (05/07 0500) Weight change: 0 kg (0 lb) Last BM Date:  (unknown)  Intake/Output from previous day: 05/06 0701 - 05/07 0700 In: 4799.4 [I.V.:4081.9; IV Piggyback:717.5] Out: 3650 [Urine:3650]  PHYSICAL EXAM General appearance: Sedated but arousable earlier Resp: clear to auscultation bilaterally Cardio: regular rate and rhythm, S1, S2 normal, no murmur, click, rub or gallop GI: soft, non-tender; bowel sounds normal; no masses,  no organomegaly Extremities: extremities normal, atraumatic, no cyanosis or edema Skin warm and dry  Lab Results:  Results for orders placed or performed during the hospital encounter of 06/12/16 (from the past 48 hour(s))  Glucose, capillary     Status: None   Collection Time: 06/16/16 11:39 AM  Result Value Ref Range   Glucose-Capillary 90 65 - 99 mg/dL  Glucose, capillary     Status: None   Collection Time: 06/16/16  4:33 PM  Result Value Ref Range   Glucose-Capillary 87 65 - 99 mg/dL  Glucose, capillary     Status: None   Collection Time: 06/16/16  8:31 PM  Result Value Ref Range   Glucose-Capillary 79 65 - 99 mg/dL  Glucose, capillary     Status: None   Collection Time: 06/17/16 12:13 AM  Result Value Ref Range   Glucose-Capillary 82 65 - 99 mg/dL  Glucose, capillary     Status: None    Collection Time: 06/17/16  3:50 AM  Result Value Ref Range   Glucose-Capillary 81 65 - 99 mg/dL  Glucose, capillary     Status: None   Collection Time: 06/17/16  7:47 AM  Result Value Ref Range   Glucose-Capillary 88 65 - 99 mg/dL   Comment 1 Notify RN    Comment 2 Document in Chart   Glucose, capillary     Status: None   Collection Time: 06/17/16 11:19 AM  Result Value Ref Range   Glucose-Capillary 99 65 - 99 mg/dL  Glucose, capillary     Status: None   Collection Time: 06/17/16  4:14 PM  Result Value Ref Range   Glucose-Capillary 81 65 - 99 mg/dL  Glucose, capillary     Status: None   Collection Time: 06/17/16  7:40 PM  Result Value Ref Range   Glucose-Capillary 87 65 - 99 mg/dL  Glucose, capillary     Status: None   Collection Time: 06/18/16 12:45 AM  Result Value Ref Range   Glucose-Capillary 82 65 - 99 mg/dL  Glucose, capillary     Status: None   Collection Time: 06/18/16  5:19 AM  Result Value Ref Range   Glucose-Capillary 92 65 - 99 mg/dL  Glucose, capillary     Status: None   Collection Time: 06/18/16  7:27 AM  Result Value Ref Range   Glucose-Capillary 83 65 -  99 mg/dL    ABGS No results for input(s): PHART, PO2ART, TCO2, HCO3 in the last 72 hours.  Invalid input(s): PCO2 CULTURES Recent Results (from the past 240 hour(s))  MRSA PCR Screening     Status: None   Collection Time: 06/12/16  5:55 PM  Result Value Ref Range Status   MRSA by PCR NEGATIVE NEGATIVE Final    Comment:        The GeneXpert MRSA Assay (FDA approved for NASAL specimens only), is one component of a comprehensive MRSA colonization surveillance program. It is not intended to diagnose MRSA infection nor to guide or monitor treatment for MRSA infections.    Studies/Results: No results found.  Medications:  Prior to Admission:  Prescriptions Prior to Admission  Medication Sig Dispense Refill Last Dose  . albuterol (PROVENTIL HFA;VENTOLIN HFA) 108 (90 BASE) MCG/ACT inhaler  Inhale 2 puffs into the lungs every 6 (six) hours as needed. For shortness of breath   unknown  . ALPRAZolam (XANAX) 1 MG tablet Take 1 mg by mouth 3 (three) times daily as needed for anxiety.   06/12/2016 at Unknown time  . oxyCODONE-acetaminophen (PERCOCET) 10-325 MG tablet Take 1 tablet by mouth 4 (four) times daily as needed for pain.   Past Week at Unknown time   Scheduled: . chlorhexidine  15 mL Mouth/Throat BID  . enoxaparin (LOVENOX) injection  40 mg Subcutaneous Q24H  . folic acid  1 mg Intravenous Daily  . insulin aspart  0-9 Units Subcutaneous Q4H  . mouth rinse  15 mL Mouth Rinse QID  . sodium chloride flush  3 mL Intravenous Q12H  . thiamine  100 mg Intravenous Daily   Continuous: . sodium chloride 1,000 mL (06/17/16 1820)  . dexmedetomidine (PRECEDEX) IV infusion 1.2 mcg/kg/hr (06/18/16 0651)  . famotidine (PEPCID) IV Stopped (06/17/16 2141)  . levETIRAcetam Stopped (06/17/16 2126)  . piperacillin-tazobactam (ZOSYN)  IV Stopped (06/18/16 0521)  . valproate sodium Stopped (06/18/16 0612)   ZOX:[id number]:acetaminophen **OR** acetaminophen, bisacodyl, docusate, fentaNYL (SUBLIMAZE) injection, fentaNYL (SUBLIMAZE) injection, haloperidol lactate, ipratropium-albuterol, LORazepam, ondansetron **OR** ondansetron (ZOFRAN) IV  Assesment: He was admitted having had status epilepticus. He had alcohol intoxication. At baseline he's had a history of a traumatic brain injury. He has remained agitated and combative despite having had alcohol withdrawal protocol and being treated for seizures. This may be a little bit better. He also has aspiration pneumonia which is being treated with IV antibiotics. He initially was intubated but self extubated. Principal Problem:   Seizures (HCC) Active Problems:   Alcohol abuse with intoxication (HCC)   COPD (chronic obstructive pulmonary disease) (HCC)   History of traumatic brain injury   Endotracheally intubated   Respiratory failure (HCC)   Aspiration  pneumonia (HCC)   Metabolic encephalopathy    Plan:try to reduce sedation the medication list above is not accurate but the medication list on the manager orders section is accurate.    LOS: 6 days   Macarena Langseth L 06/18/2016, 7:55 AM

## 2016-06-19 LAB — GLUCOSE, CAPILLARY
GLUCOSE-CAPILLARY: 106 mg/dL — AB (ref 65–99)
GLUCOSE-CAPILLARY: 105 mg/dL — AB (ref 65–99)
GLUCOSE-CAPILLARY: 105 mg/dL — AB (ref 65–99)
GLUCOSE-CAPILLARY: 108 mg/dL — AB (ref 65–99)
Glucose-Capillary: 100 mg/dL — ABNORMAL HIGH (ref 65–99)
Glucose-Capillary: 91 mg/dL (ref 65–99)

## 2016-06-19 LAB — CREATININE, SERUM: Creatinine, Ser: 0.61 mg/dL (ref 0.61–1.24)

## 2016-06-19 MED ORDER — HALOPERIDOL LACTATE 5 MG/ML IJ SOLN
5.0000 mg | Freq: Two times a day (BID) | INTRAMUSCULAR | Status: DC
  Administered 2016-06-19 – 2016-06-20 (×4): 5 mg via INTRAMUSCULAR
  Filled 2016-06-19 (×4): qty 1

## 2016-06-19 MED ORDER — HALOPERIDOL 2 MG PO TABS: 5.0000 mg | ORAL_TABLET | Freq: Two times a day (BID) | ORAL | Status: DC

## 2016-06-19 NOTE — Progress Notes (Signed)
[place] A. [name], MD     [email or web address]          [name] is an 51 y.o. male.   ASSESSMENT/PLAN: 1. Resolved status epilepticus due to abrupt discontinuation of antiepileptic medications. No recurrent seizures on Depakote and Keppra.  2. Encephalopathy which is multifactorial including medication effect and the dose ictal lethargy. He is on significant sedation. The dose of Haldol will be increased to help with the behavioral issues and with the hopes of weaning off all sedatives.  3. History of traumatic brain injury. 4. Suspect underlying polyneuropathy.    The nursing staff reports that the patient is more responsive. The fentanyl has been discontinued. He is on the Haldol. He is still is resistance and belligerent at times.       GENERAL: No seizures observed. He is quite sedated.   HEENT: Supple. Atraumatic normocephalic.   ABDOMEN: soft  EXTREMITIES: No edema   BACK: Normal.  SKIN: Normal by inspection.    MENTAL STATUS: He lays in bed with eyes closed. He open his eyes with light tactile stimulation. The patient does verbalizes "what" and "what do you want". He is resistant to the evaluation however and did through a few punches and the air. He did not follow commands.   CRANIAL NERVES: Pupils small but equal, round and reactive to light; extra ocular movements appear still be intact; upper and lower facial muscles are normal in strength and symmetric, there is no flattening of the nasolabial folds.   MOTOR: Mild withdrawal against gravity in the arms and legs. Bulk and tone are mostly unremarkable.   COORDINATION: No tremors are appreciated. No parkinsonism appreciated.   SENSATION: No response to deep painful stimuli bilaterally.    Blood pressure (!) 149/81, pulse 61, temperature 98.6 F (37 C), resp. rate 10, height _0  (1.88 m), weight 207 lb 7.3 oz (94.1 kg), SpO2 94 %.  Past Medical History:  Diagnosis Date  .  Arthritis   . Asthma   . Chronic neck pain   . COPD (chronic obstructive pulmonary disease) ([place])   . Injury of jawline   . Mental disorder   . Ruptured disc, cervical   . Seizures ([place])   . Torn rotator cuff     History reviewed. No pertinent surgical history.  Family History  Problem Relation Age of Onset  . Heart failure Father   . Cancer Father   . Alcoholism Brother     Social History:  reports that he has been smoking Cigarettes and Cigars.  He has never used smokeless tobacco. He reports that he does not drink alcohol or use drugs.  Allergies:  Allergies  Allergen Reactions  . Oxycodone-Acetaminophen     Severe rash and itching  . Oxycodone Hcl     itching  . Percocet [Oxycodone-Acetaminophen] Other (See Comments)    itching    Medications: Prior to Admission medications   Medication Sig Start Date End Date Taking? Authorizing Provider  ALPRAZolam Duanne Moron) 1 MG tablet Take 1 mg by mouth 3 (three) times daily as needed for anxiety.   Yes Historical Provider, MD  oxyCODONE-acetaminophen (PERCOCET) 10-325 MG tablet Take 1 tablet by mouth 4 (four) times daily as needed for pain.   Yes Historical Provider, MD  albuterol (PROVENTIL HFA;VENTOLIN HFA) 108 (90 BASE) MCG/ACT inhaler Inhale 2 puffs into the lungs every 6 (six) hours as needed. For shortness of breath    Historical Provider, MD    Scheduled Meds: .  chlorhexidine  15 mL Mouth/Throat BID  . enoxaparin (LOVENOX) injection  40 mg Subcutaneous Q24H  . folic acid  1 mg Intravenous Daily  . haloperidol  2 mg Oral BID   Or  . haloperidol lactate  2 mg Intramuscular BID  . insulin aspart  0-9 Units Subcutaneous Q4H  . mouth rinse  15 mL Mouth Rinse QID  . sodium chloride flush  3 mL Intravenous Q12H  . thiamine  100 mg Intravenous Daily   Continuous Infusions: . sodium chloride 110 mL/hr at 06/19/16 0603  . dexmedetomidine (PRECEDEX) IV infusion 1.2 mcg/kg/hr (06/19/16 0647)  . famotidine (PEPCID) IV Stopped  (06/18/16 2320)  . levETIRAcetam Stopped (06/18/16 2305)  . piperacillin-tazobactam (ZOSYN)  IV Stopped (06/19/16 0452)  . valproate sodium Stopped (06/19/16 0703)   PRN Meds:.acetaminophen **OR** acetaminophen, bisacodyl, docusate, fentaNYL (SUBLIMAZE) injection, fentaNYL (SUBLIMAZE) injection, haloperidol lactate, ipratropium-albuterol, LORazepam, ondansetron **OR** ondansetron (ZOFRAN) IV     Results for orders placed or performed during the hospital encounter of 06/12/16 (from the past 48 hour(s))  Glucose, capillary     Status: None   Collection Time: 06/17/16 11:19 AM  Result Value Ref Range   Glucose-Capillary 99 65 - 99 mg/dL  Glucose, capillary     Status: None   Collection Time: 06/17/16  4:14 PM  Result Value Ref Range   Glucose-Capillary 81 65 - 99 mg/dL  Glucose, capillary     Status: None   Collection Time: 06/17/16  7:40 PM  Result Value Ref Range   Glucose-Capillary 87 65 - 99 mg/dL  Glucose, capillary     Status: None   Collection Time: 06/18/16 12:45 AM  Result Value Ref Range   Glucose-Capillary 82 65 - 99 mg/dL  Glucose, capillary     Status: None   Collection Time: 06/18/16  5:19 AM  Result Value Ref Range   Glucose-Capillary 92 65 - 99 mg/dL  Glucose, capillary     Status: None   Collection Time: 06/18/16  7:27 AM  Result Value Ref Range   Glucose-Capillary 83 65 - 99 mg/dL  Glucose, capillary     Status: None   Collection Time: 06/18/16 11:20 AM  Result Value Ref Range   Glucose-Capillary 85 65 - 99 mg/dL  Glucose, capillary     Status: None   Collection Time: 06/18/16  4:20 PM  Result Value Ref Range   Glucose-Capillary 87 65 - 99 mg/dL   Comment 1 Notify RN    Comment 2 Document in Chart   Glucose, capillary     Status: None   Collection Time: 06/18/16  7:49 PM  Result Value Ref Range   Glucose-Capillary 94 65 - 99 mg/dL   Comment 1 Notify RN   Glucose, capillary     Status: None   Collection Time: 06/18/16 11:58 PM  Result Value Ref Range     Glucose-Capillary 85 65 - 99 mg/dL   Comment 1 Notify RN   Glucose, capillary     Status: None   Collection Time: 06/19/16  3:56 AM  Result Value Ref Range   Glucose-Capillary 91 65 - 99 mg/dL   Comment 1 Notify RN   Creatinine, serum     Status: None   Collection Time: 06/19/16  4:46 AM  Result Value Ref Range   Creatinine, Ser 0.61 0.61 - 1.24 mg/dL   GFR calc non Af Amer >60 >60 mL/min   GFR calc Af Amer >60 >60 mL/min    Comment: (NOTE) The eGFR  has been calculated using the CKD EPI equation. This calculation has not been validated in all clinical situations. eGFR's persistently <60 mL/min signify possible Chronic Kidney Disease.   Glucose, capillary     Status: Abnormal   Collection Time: 06/19/16  7:46 AM  Result Value Ref Range   Glucose-Capillary 106 (H) 65 - 99 mg/dL    Studies/Results:     Boluwatife Mutchler A. [name], M.D.  Diplomate, Tax adviser of Psychiatry and Neurology ( Neurology). 06/19/2016, 8:16 AM

## 2016-06-19 NOTE — Care Management Note (Signed)
Case Management Note  Patient Details  Name: [name] MRN: [id number] Date of Birth: 1966-01-11  If discussed at Long Length of Stay Meetings, dates discussed:   06/19/2016   Malcolm MetroChildress, Jvion Turgeon Demske, RN 06/19/2016, 10:18 AM

## 2016-06-19 NOTE — Progress Notes (Signed)
Pharmacy Antibiotic Note  Roselle LocusSteven L Betke is a 51 y.o. male admitted on 06/12/2016 with aspiration pneumonia.  Pharmacy has been consulted for ZOSYN dosing. D#6 of zosyn. Duration of therapy per MD  Plan:  Continue Zosyn 3.375gm IV q8h, EID Monitor labs, renal fxn, progress and c/s Deescalate ABX when improved / appropriate.    Antimicrobials this admission: Zosyn 5/2 >>   Height: 6\' 2"  (188 cm) Weight: 207 lb 7.3 oz (94.1 kg) IBW/kg (Calculated) : 82.2  Temp (24hrs), Avg:98.3 F (36.8 C), Min:97.7 F (36.5 C), Max:99.1 F (37.3 C)   Recent Labs Lab 06/12/16 1218 06/12/16 1551 06/13/16 0550 06/19/16 0446  WBC  --  8.0 11.6*  --   CREATININE 1.10 0.82 0.77 0.61    Estimated Creatinine Clearance: 128.4 mL/min (by C-G formula based on SCr of 0.61 mg/dL).    Allergies  Allergen Reactions  . Oxycodone-Acetaminophen     Severe rash and itching  . Oxycodone Hcl     itching  . Percocet [Oxycodone-Acetaminophen] Other (See Comments)    itching   Microbiology results:  5/1MRSA PCR: negative  Thank you for allowing pharmacy to be a part of this patient's care.  Valrie HartScott Zamyiah Tino, PharmD Clinical Pharmacist Pager:  [phone] 06/19/2016   06/19/2016 9:47 AM

## 2016-06-19 NOTE — Progress Notes (Signed)
Subjective: He is more responsive. He has been yelling out during the night. He is still somewhat combative but better.  Objective: Vital signs in last 24 hours: Temp:  [97.7 F (36.5 C)-99.1 F (37.3 C)] 98.6 F (37 C) (05/08 0741) Pulse Rate:  [53-72] 61 (05/08 0741) Resp:  [7-20] 10 (05/08 0741) BP: (104-164)/(67-98) 149/81 (05/08 0600) SpO2:  [92 %-99 %] 94 % (05/08 0741) FiO2 (%):  [36 %] 36 % (05/07 2000) Weight:  [94.1 kg (207 lb 7.3 oz)] 94.1 kg (207 lb 7.3 oz) (05/08 0500) Weight change: -2.2 kg (-4 lb 13.6 oz) Last BM Date:  (UTA)  Intake/Output from previous day: 05/07 0701 - 05/08 0700 In: 3762.7 [I.V.:3247.7; IV ZOXWRUEAV:409] Out: 8119 [Urine:4025]  PHYSICAL EXAM General appearance: Sleepy now but generally more alert. When I arrived on the unit he was moaning and groaning. Resp: clear to auscultation bilaterally Cardio: regular rate and rhythm, S1, S2 normal, no murmur, click, rub or gallop GI: soft, non-tender; bowel sounds normal; no masses,  no organomegaly Extremities: extremities normal, atraumatic, no cyanosis or edema Skin warm and dry  Lab Results:  Results for orders placed or performed during the hospital encounter of 06/12/16 (from the past 48 hour(s))  Glucose, capillary     Status: None   Collection Time: 06/17/16 11:19 AM  Result Value Ref Range   Glucose-Capillary 99 65 - 99 mg/dL  Glucose, capillary     Status: None   Collection Time: 06/17/16  4:14 PM  Result Value Ref Range   Glucose-Capillary 81 65 - 99 mg/dL  Glucose, capillary     Status: None   Collection Time: 06/17/16  7:40 PM  Result Value Ref Range   Glucose-Capillary 87 65 - 99 mg/dL  Glucose, capillary     Status: None   Collection Time: 06/18/16 12:45 AM  Result Value Ref Range   Glucose-Capillary 82 65 - 99 mg/dL  Glucose, capillary     Status: None   Collection Time: 06/18/16  5:19 AM  Result Value Ref Range   Glucose-Capillary 92 65 - 99 mg/dL  Glucose, capillary      Status: None   Collection Time: 06/18/16  7:27 AM  Result Value Ref Range   Glucose-Capillary 83 65 - 99 mg/dL  Glucose, capillary     Status: None   Collection Time: 06/18/16 11:20 AM  Result Value Ref Range   Glucose-Capillary 85 65 - 99 mg/dL  Glucose, capillary     Status: None   Collection Time: 06/18/16  4:20 PM  Result Value Ref Range   Glucose-Capillary 87 65 - 99 mg/dL   Comment 1 Notify RN    Comment 2 Document in Chart   Glucose, capillary     Status: None   Collection Time: 06/18/16  7:49 PM  Result Value Ref Range   Glucose-Capillary 94 65 - 99 mg/dL   Comment 1 Notify RN   Glucose, capillary     Status: None   Collection Time: 06/18/16 11:58 PM  Result Value Ref Range   Glucose-Capillary 85 65 - 99 mg/dL   Comment 1 Notify RN   Glucose, capillary     Status: None   Collection Time: 06/19/16  3:56 AM  Result Value Ref Range   Glucose-Capillary 91 65 - 99 mg/dL   Comment 1 Notify RN   Creatinine, serum     Status: None   Collection Time: 06/19/16  4:46 AM  Result Value Ref Range   Creatinine, Ser  0.61 0.61 - 1.24 mg/dL   GFR calc non Af Amer >60 >60 mL/min   GFR calc Af Amer >60 >60 mL/min    Comment: (NOTE) The eGFR has been calculated using the CKD EPI equation. This calculation has not been validated in all clinical situations. eGFR's persistently <60 mL/min signify possible Chronic Kidney Disease.   Glucose, capillary     Status: Abnormal   Collection Time: 06/19/16  7:46 AM  Result Value Ref Range   Glucose-Capillary 106 (H) 65 - 99 mg/dL    ABGS No results for input(s): PHART, PO2ART, TCO2, HCO3 in the last 72 hours.  Invalid input(s): PCO2 CULTURES Recent Results (from the past 240 hour(s))  MRSA PCR Screening     Status: None   Collection Time: 06/12/16  5:55 PM  Result Value Ref Range Status   MRSA by PCR NEGATIVE NEGATIVE Final    Comment:        The GeneXpert MRSA Assay (FDA approved for NASAL specimens only), is one component of  a comprehensive MRSA colonization surveillance program. It is not intended to diagnose MRSA infection nor to guide or monitor treatment for MRSA infections.    Studies/Results: No results found.  Medications:  Prior to Admission:  Prescriptions Prior to Admission  Medication Sig Dispense Refill Last Dose  . albuterol (PROVENTIL HFA;VENTOLIN HFA) 108 (90 BASE) MCG/ACT inhaler Inhale 2 puffs into the lungs every 6 (six) hours as needed. For shortness of breath   unknown  . ALPRAZolam (XANAX) 1 MG tablet Take 1 mg by mouth 3 (three) times daily as needed for anxiety.   06/12/2016 at Unknown time  . oxyCODONE-acetaminophen (PERCOCET) 10-325 MG tablet Take 1 tablet by mouth 4 (four) times daily as needed for pain.   Past Week at Unknown time   Scheduled: . chlorhexidine  15 mL Mouth/Throat BID  . enoxaparin (LOVENOX) injection  40 mg Subcutaneous Q24H  . folic acid  1 mg Intravenous Daily  . haloperidol lactate  5 mg Intramuscular BID   Or  . haloperidol  5 mg Oral BID  . insulin aspart  0-9 Units Subcutaneous Q4H  . mouth rinse  15 mL Mouth Rinse QID  . sodium chloride flush  3 mL Intravenous Q12H  . thiamine  100 mg Intravenous Daily   Continuous: . sodium chloride 110 mL/hr at 06/19/16 0603  . dexmedetomidine (PRECEDEX) IV infusion 1.2 mcg/kg/hr (06/19/16 0819)  . famotidine (PEPCID) IV 20 mg (06/19/16 0819)  . levETIRAcetam Stopped (06/18/16 2305)  . piperacillin-tazobactam (ZOSYN)  IV Stopped (06/19/16 0452)  . valproate sodium Stopped (06/19/16 0703)   WNU:[id number] **OR** acetaminophen, bisacodyl, docusate, fentaNYL (SUBLIMAZE) injection, fentaNYL (SUBLIMAZE) injection, haloperidol lactate, ipratropium-albuterol, LORazepam, ondansetron **OR** ondansetron (ZOFRAN) IV  Assesment: He was admitted with status epilepticus. He also was intoxicated with alcohol. At baseline he has seizure disorder had a traumatic brain injury. He has aspiration pneumonia which is being treated.  He has encephalopathy partially probably from alcohol and from post ictal state at his baseline he is anxious. Principal Problem:   Seizures ([place]) Active Problems:   Alcohol abuse with intoxication ([place])   COPD (chronic obstructive pulmonary disease) ([place])   History of traumatic brain injury   Endotracheally intubated   Respiratory failure ([place])   Aspiration pneumonia ([place])   Metabolic encephalopathy    Plan: Continue current treatments. Continue trying to taper off medications and see if we can get him more alert    LOS: 7 days   Cardell Rachel L  06/19/2016, 8:30 AM

## 2016-06-19 NOTE — Progress Notes (Signed)
Initial Nutrition Assessment  DOCUMENTATION CODES:  Severe malnutrition in context of acute illness/injury  INTERVENTION:  Pt has gone 7 days without nutrition. Given pts history of etoh abuse PTA and probably suboptimal nutrition to begin with, would recommend placement of NGT for TF support if unable to wean off of sedation in next 24-48 hrs.    NUTRITION DIAGNOSIS:  Malnutrition (Severe) related to lethargy/confusion as evidenced by an energy intake that has met < or equal to 50% of needs for > or equal to 5 days and an apparent loss of >2% bw x1 week  GOAL:  Patient will meet greater than or equal to 90% of their needs  MONITOR:  Supplement acceptance, PO intake, Diet advancement, Labs, Weight trends  REASON FOR ASSESSMENT:  Low [place], LOS, NPO/Clear Liquid Diet    ASSESSMENT:  51 y/o male PMHx TBI, Chronic Pain, seizure disorder, COPD. Initially presented to ED after experiencing significant seizures. Was briefly intubated in the ED for respiratory failure, self extubated shortly after. Has been maintained on Bipap and sedation majority of admission for AMS/Combativeness.   RD following up due to LOS and no intake x 7 days. Though he has a diet order in place, has instructions to hold tray while on Bipap and precedex.  Pt has been successfully weaned off bipap, however remains sedated as he Hallucinates and become aggressive when sedation weaned.   On RD arrival, pt is alone, no family present. Nursing reports ongoing difficulties weaning precedex due to agression  He was admitted at weight of 242 lbs, however likely reported-bed weight 5/1 was 226 lbs. Subsequent measurements have varied from 205-218 lbs. Bed weight today is 207.4 lbs  Physical Exam: No wasting/edema. Abdomen soft.  Meds: Haldol, THiamine, precedex infusion. Labs: Bg checks 80-105    Recent Labs Lab 06/12/16 1218 06/12/16 1551 06/13/16 0550 06/19/16 0446  NA 145 143 142  --   K 3.8 4.0 3.7  --   CL  105 107 108  --   CO2  --  27 27  --   BUN 4* 6 8  --   CREATININE 1.10 0.82 0.77 0.61  CALCIUM  --  9.0 8.5*  --   MG  --  2.1  --   --   GLUCOSE 106* 104* 123*  --    Diet Order:  Diet clear liquid Room service appropriate? Yes; Fluid consistency: Thin  Skin:  Reviewed, no issues  Last BM:  Unknown-none since admit  Height:  Ht Readings from Last 1 Encounters:  06/13/16 6' 2"  (1.88 m)   Weight:  Wt Readings from Last 1 Encounters:  06/19/16 207 lb 7.3 oz (94.1 kg)   Wt Readings from Last 10 Encounters:  06/19/16 207 lb 7.3 oz (94.1 kg)  05/09/16 225 lb (102.1 kg)  12/15/15 252 lb (114.3 kg)  02/13/15 265 lb (120.2 kg)  05/26/11 240 lb (108.9 kg)  01/19/11 240 lb (108.9 kg)  10/17/10 240 lb (108.9 kg)   Ideal Body Weight:  86.36 kg  BMI:  Body mass index is 26.64 kg/m.  Estimated Nutritional Needs:  Kcal:  2150-2350 kcals (23-25 kcal/kg bw) Protein:  86-104g (1-1.2 g/kg ibw) Fluid:  >2.1 L ( 93m/kcal)  EDUCATION NEEDS:  No education needs identified at this time  NBurtis JunesRD, LDN, CCalumetNutrition Pager: [phone]/09/2016 11:59 AM

## 2016-06-20 DIAGNOSIS — E43 Unspecified severe protein-calorie malnutrition: Secondary | ICD-10-CM | POA: Insufficient documentation

## 2016-06-20 LAB — COMPREHENSIVE METABOLIC PANEL
ALBUMIN: 2.9 g/dL — AB (ref 3.5–5.0)
ALT: 15 U/L — AB (ref 17–63)
AST: 21 U/L (ref 15–41)
Alkaline Phosphatase: 53 U/L (ref 38–126)
Anion gap: 9 (ref 5–15)
BUN: 6 mg/dL (ref 6–20)
CALCIUM: 8.2 mg/dL — AB (ref 8.9–10.3)
CHLORIDE: 105 mmol/L (ref 101–111)
CO2: 27 mmol/L (ref 22–32)
CREATININE: 0.66 mg/dL (ref 0.61–1.24)
GFR calc Af Amer: >60 mL/min (ref >60)
Glucose, Bld: 110 mg/dL — ABNORMAL HIGH (ref 65–99)
POTASSIUM: 2.8 mmol/L — AB (ref 3.5–5.1)
SODIUM: 141 mmol/L (ref 135–145)
TOTAL PROTEIN: 7 g/dL (ref 6.5–8.1)
Total Bilirubin: 0.7 mg/dL (ref 0.3–1.2)

## 2016-06-20 LAB — CBC WITH DIFFERENTIAL/PLATELET
Basophils Absolute: 0 10*3/uL (ref 0.0–0.1)
Basophils Relative: 0 %
EOS PCT: 1 %
Eosinophils Absolute: 0.1 10*3/uL (ref 0.0–0.7)
HCT: 41.4 % (ref 39.0–52.0)
Hemoglobin: 15 g/dL (ref 13.0–17.0)
LYMPHS ABS: 1.3 10*3/uL (ref 0.7–4.0)
Lymphocytes Relative: 15 %
MCH: 32.5 pg (ref 26.0–34.0)
MCHC: 36.2 g/dL — ABNORMAL HIGH (ref 30.0–36.0)
MCV: 89.6 fL (ref 78.0–100.0)
Monocytes Absolute: 0.8 10*3/uL (ref 0.1–1.0)
Monocytes Relative: 9 %
NEUTROS PCT: 75 %
Neutro Abs: 6.3 10*3/uL (ref 1.7–7.7)
PLATELETS: 190 10*3/uL (ref 150–400)
RBC: 4.62 MIL/uL (ref 4.22–5.81)
RDW: 11.8 % (ref 11.5–15.5)
WBC: 8.4 10*3/uL (ref 4.0–10.5)

## 2016-06-20 LAB — GLUCOSE, CAPILLARY
GLUCOSE-CAPILLARY: 107 mg/dL — AB (ref 65–99)
GLUCOSE-CAPILLARY: 97 mg/dL (ref 65–99)
GLUCOSE-CAPILLARY: 105 mg/dL — AB (ref 65–99)
GLUCOSE-CAPILLARY: 113 mg/dL — AB (ref 65–99)
Glucose-Capillary: 97 mg/dL (ref 65–99)
Glucose-Capillary: 98 mg/dL (ref 65–99)

## 2016-06-20 MED ORDER — DEXMEDETOMIDINE HCL IN NACL 200 MCG/50ML IV SOLN
INTRAVENOUS | Status: AC
  Filled 2016-06-20: qty 50

## 2016-06-20 MED ORDER — POTASSIUM CHLORIDE 10 MEQ/100ML IV SOLN
10.0000 meq | INTRAVENOUS | Status: AC
  Administered 2016-06-20 (×6): 10 meq via INTRAVENOUS
  Filled 2016-06-20: qty 100

## 2016-06-20 NOTE — Progress Notes (Signed)
Patient had a rough day today. He started off very agitated this morning. Trying to get out of bed, pull IVs out, and attempted to remove foley even with the safety mitts. Patient also had several complaints of pain in his back.   This afternoon patient has been sleeping off and on. He wakes up and can tell me his name and birthday but not much else. He did not want to eat dinner this evening when I offered it to him.

## 2016-06-20 NOTE — Progress Notes (Signed)
Patient has been very agitated this morning. He has had to very soft BMs. He also was able to verbalize a lot of pain in his back and said "not comfortable" IV pain meds given and he said it helped.  Labs drawn this morning and patient had potassium of 2.8. Called Dr. Juanetta GoslingHawkins who ordered 6 runs of KCl for patient.

## 2016-06-20 NOTE — Progress Notes (Signed)
Subjective: He is more alert. He attempts to have a conversation. He was able to communicate with nursing staff overnight. He is still somewhat agitated.  Objective: Vital signs in last 24 hours: Temp:  [97.7 F (36.5 C)-99 F (37.2 C)] 99 F (37.2 C) (05/09 0600) Pulse Rate:  [53-84] 78 (05/09 0600) Resp:  [10-26] 18 (05/09 0600) BP: (123-175)/(79-96) 149/91 (05/09 0600) SpO2:  [91 %-98 %] 97 % (05/09 0600) Weight:  [93.7 kg (206 lb 9.1 oz)] 93.7 kg (206 lb 9.1 oz) (05/09 0500) Weight change: -0.4 kg (-14.1 oz) Last BM Date:  (UTA)  Intake/Output from previous day: 05/08 0701 - 05/09 0700 In: 4086.2 [I.V.:3231.2; IV Piggyback:855] Out: 4250 [Urine:4250]  PHYSICAL EXAM General appearance: More alert. Still agitated. Resp: clear to auscultation bilaterally Cardio: regular rate and rhythm, S1, S2 normal, no murmur, click, rub or gallop GI: soft, non-tender; bowel sounds normal; no masses,  no organomegaly Extremities: extremities normal, atraumatic, no cyanosis or edema Skin warm and dry mucous membranes are more moist than yesterday  Lab Results:  Results for orders placed or performed during the hospital encounter of 06/12/16 (from the past 48 hour(s))  Glucose, capillary     Status: None   Collection Time: 06/18/16 11:20 AM  Result Value Ref Range   Glucose-Capillary 85 65 - 99 mg/dL  Glucose, capillary     Status: None   Collection Time: 06/18/16  4:20 PM  Result Value Ref Range   Glucose-Capillary 87 65 - 99 mg/dL   Comment 1 Notify RN    Comment 2 Document in Chart   Glucose, capillary     Status: None   Collection Time: 06/18/16  7:49 PM  Result Value Ref Range   Glucose-Capillary 94 65 - 99 mg/dL   Comment 1 Notify RN   Glucose, capillary     Status: None   Collection Time: 06/18/16 11:58 PM  Result Value Ref Range   Glucose-Capillary 85 65 - 99 mg/dL   Comment 1 Notify RN   Glucose, capillary     Status: None   Collection Time: 06/19/16  3:56 AM  Result  Value Ref Range   Glucose-Capillary 91 65 - 99 mg/dL   Comment 1 Notify RN   Creatinine, serum     Status: None   Collection Time: 06/19/16  4:46 AM  Result Value Ref Range   Creatinine, Ser 0.61 0.61 - 1.24 mg/dL   GFR calc non Af Amer >60 >60 mL/min   GFR calc Af Amer >60 >60 mL/min    Comment: (NOTE) The eGFR has been calculated using the CKD EPI equation. This calculation has not been validated in all clinical situations. eGFR's persistently <60 mL/min signify possible Chronic Kidney Disease.   Glucose, capillary     Status: Abnormal   Collection Time: 06/19/16  7:46 AM  Result Value Ref Range   Glucose-Capillary 106 (H) 65 - 99 mg/dL  Glucose, capillary     Status: Abnormal   Collection Time: 06/19/16 11:23 AM  Result Value Ref Range   Glucose-Capillary 105 (H) 65 - 99 mg/dL  Glucose, capillary     Status: Abnormal   Collection Time: 06/19/16  4:10 PM  Result Value Ref Range   Glucose-Capillary 105 (H) 65 - 99 mg/dL  Glucose, capillary     Status: Abnormal   Collection Time: 06/19/16  7:41 PM  Result Value Ref Range   Glucose-Capillary 108 (H) 65 - 99 mg/dL   Comment 1 Notify RN  Glucose, capillary     Status: Abnormal   Collection Time: 06/19/16 11:54 PM  Result Value Ref Range   Glucose-Capillary 100 (H) 65 - 99 mg/dL   Comment 1 Notify RN   Glucose, capillary     Status: None   Collection Time: 06/20/16  4:21 AM  Result Value Ref Range   Glucose-Capillary 97 65 - 99 mg/dL   Comment 1 Notify RN     ABGS No results for input(s): PHART, PO2ART, TCO2, HCO3 in the last 72 hours.  Invalid input(s): PCO2 CULTURES Recent Results (from the past 240 hour(s))  MRSA PCR Screening     Status: None   Collection Time: 06/12/16  5:55 PM  Result Value Ref Range Status   MRSA by PCR NEGATIVE NEGATIVE Final    Comment:        The GeneXpert MRSA Assay (FDA approved for NASAL specimens only), is one component of a comprehensive MRSA colonization surveillance program. It  is not intended to diagnose MRSA infection nor to guide or monitor treatment for MRSA infections.    Studies/Results: No results found.  Medications:  Prior to Admission:  Prescriptions Prior to Admission  Medication Sig Dispense Refill Last Dose  . albuterol (PROVENTIL HFA;VENTOLIN HFA) 108 (90 BASE) MCG/ACT inhaler Inhale 2 puffs into the lungs every 6 (six) hours as needed. For shortness of breath   unknown  . ALPRAZolam (XANAX) 1 MG tablet Take 1 mg by mouth 3 (three) times daily as needed for anxiety.   06/12/2016 at Unknown time  . oxyCODONE-acetaminophen (PERCOCET) 10-325 MG tablet Take 1 tablet by mouth 4 (four) times daily as needed for pain.   Past Week at Unknown time   Scheduled: . chlorhexidine  15 mL Mouth/Throat BID  . enoxaparin (LOVENOX) injection  40 mg Subcutaneous Q24H  . folic acid  1 mg Intravenous Daily  . haloperidol lactate  5 mg Intramuscular BID   Or  . haloperidol  5 mg Oral BID  . insulin aspart  0-9 Units Subcutaneous Q4H  . mouth rinse  15 mL Mouth Rinse QID  . sodium chloride flush  3 mL Intravenous Q12H  . thiamine  100 mg Intravenous Daily   Continuous: . sodium chloride 110 mL/hr at 06/19/16 1602  . dexmedetomidine (PRECEDEX) IV infusion 0.1 mcg/kg/hr (06/20/16 0263)  . famotidine (PEPCID) IV Stopped (06/19/16 2158)  . levETIRAcetam Stopped (06/19/16 2143)  . piperacillin-tazobactam (ZOSYN)  IV Stopped (06/20/16 7858)  . valproate sodium 500 mg (06/20/16 0555)   IFO:YDXAJOINOMVEH **OR** acetaminophen, bisacodyl, docusate, fentaNYL (SUBLIMAZE) injection, fentaNYL (SUBLIMAZE) injection, haloperidol lactate, ipratropium-albuterol, LORazepam, ondansetron **OR** ondansetron (ZOFRAN) IV  Assesment: He was admitted with status epilepticus. He was intubated for airway protection and started on medications and has not had anymore seizures. He self extubated after about 8 hours. He has had significant metabolic encephalopathy probably related to his  intoxication with alcohol, coming off his seizure medications, postictal state, and history of traumatic brain injury.  He has had hypoxic respiratory failure and that's better.  He has aspiration pneumonia which is being treated  At baseline he has COPD  He has protein calorie malnutrition and I think we may be able to get him teach some today Principal Problem:   Seizures ([place]) Active Problems:   Alcohol abuse with intoxication ([place])   COPD (chronic obstructive pulmonary disease) ([place])   History of traumatic brain injury   Endotracheally intubated   Respiratory failure ([place])   Aspiration pneumonia ([place])   Metabolic  encephalopathy   Protein-calorie malnutrition, severe    Plan: Continue current treatments    LOS: 8 days   Doniqua Saxby L 06/20/2016, 7:40 AM

## 2016-06-21 LAB — GLUCOSE, CAPILLARY
GLUCOSE-CAPILLARY: 120 mg/dL — AB (ref 65–99)
Glucose-Capillary: 112 mg/dL — ABNORMAL HIGH (ref 65–99)
Glucose-Capillary: 106 mg/dL — ABNORMAL HIGH (ref 65–99)
Glucose-Capillary: 109 mg/dL — ABNORMAL HIGH (ref 65–99)
Glucose-Capillary: 93 mg/dL (ref 65–99)

## 2016-06-21 LAB — BASIC METABOLIC PANEL
Anion gap: 10 (ref 5–15)
BUN: 6 mg/dL (ref 6–20)
CALCIUM: 8.7 mg/dL — AB (ref 8.9–10.3)
CO2: 29 mmol/L (ref 22–32)
CREATININE: 0.63 mg/dL (ref 0.61–1.24)
Chloride: 103 mmol/L (ref 101–111)
GFR calc non Af Amer: >60 mL/min (ref >60)
Glucose, Bld: 112 mg/dL — ABNORMAL HIGH (ref 65–99)
Potassium: 2.9 mmol/L — ABNORMAL LOW (ref 3.5–5.1)
Sodium: 142 mmol/L (ref 135–145)

## 2016-06-21 LAB — VALPROIC ACID LEVEL: VALPROIC ACID LVL: 61 ug/mL (ref 50.0–100.0)

## 2016-06-21 LAB — AMMONIA: AMMONIA: 46 umol/L — AB (ref 9–35)

## 2016-06-21 MED ORDER — HALOPERIDOL LACTATE 5 MG/ML IJ SOLN
5.0000 mg | Freq: Three times a day (TID) | INTRAMUSCULAR | Status: DC
  Administered 2016-06-21 (×2): 5 mg via INTRAMUSCULAR
  Filled 2016-06-21 (×3): qty 1

## 2016-06-21 MED ORDER — MAGNESIUM SULFATE 2 GM/50ML IV SOLN
2.0000 g | INTRAVENOUS | Status: AC
Start: 2016-06-21 — End: 2016-06-21
  Administered 2016-06-21: 2 g via INTRAVENOUS
  Filled 2016-06-21: qty 50

## 2016-06-21 MED ORDER — LORAZEPAM 2 MG/ML IJ SOLN
1.0000 mg | INTRAMUSCULAR | Status: DC | PRN
  Administered 2016-06-22: 2 mg via INTRAVENOUS

## 2016-06-21 MED ORDER — POTASSIUM CHLORIDE 10 MEQ/100ML IV SOLN
10.0000 meq | INTRAVENOUS | Status: AC
  Administered 2016-06-21 – 2016-06-22 (×4): 10 meq via INTRAVENOUS
  Filled 2016-06-21 (×2): qty 100

## 2016-06-21 MED ORDER — HYDRALAZINE HCL 20 MG/ML IJ SOLN
10.0000 mg | Freq: Four times a day (QID) | INTRAMUSCULAR | Status: DC | PRN
  Administered 2016-06-21 – 2016-06-29 (×5): 10 mg via INTRAVENOUS
  Filled 2016-06-21 (×5): qty 1

## 2016-06-21 MED ORDER — HALOPERIDOL 2 MG PO TABS: 5.0000 mg | ORAL_TABLET | Freq: Three times a day (TID) | ORAL | Status: DC

## 2016-06-21 NOTE — Progress Notes (Signed)
[place] A. [name], MD     [email or web address]          [name] is an 51 y.o. male.   ASSESSMENT/PLAN: 1. Resolved status epilepticus due to abrupt discontinuation of antiepileptic medications. No recurrent seizures on Depakote and Keppra.  2. Encephalopathy which is multifactorial including medication effect and the dose ictal lethargy. Sedation will be held. On haldol. May use PRN Haldol.  Will check Ammonia and depakote levels.  3. History of traumatic brain injury. 4. Suspect underlying polyneuropathy.    Off precedex.  More responsive   GENERAL: No seizures observed. He is quite sedated.   HEENT: Supple. Atraumatic normocephalic.   ABDOMEN: soft  EXTREMITIES: No edema   BACK: Normal.  SKIN: Normal by inspection.    MENTAL STATUS: He lays in bed with eyes opened. Easily startled. Yes doing OK. Does not follow commands consistently  CRANIAL NERVES: Pupils small but equal, round and reactive to light; extra ocular movements appear still be intact; upper and lower facial muscles are normal in strength and symmetric, there is no flattening of the nasolabial folds.   MOTOR: Mild withdrawal against gravity in the arms and legs. Bulk and tone are mostly unremarkable.   COORDINATION: No tremors are appreciated. No parkinsonism appreciated.   SENSATION: No response to deep painful stimuli bilaterally.    Blood pressure (!) 173/122, pulse (!) 114, temperature 98.2 F (36.8 C), resp. rate 20, height _0  (1.88 m), weight 205 lb 0.4 oz (93 kg), SpO2 99 %.  Past Medical History:  Diagnosis Date  . Arthritis   . Asthma   . Chronic neck pain   . COPD (chronic obstructive pulmonary disease) ([place])   . Injury of jawline   . Mental disorder   . Ruptured disc, cervical   . Seizures ([place])   . Torn rotator cuff     History reviewed. No pertinent surgical history.  Family History  Problem Relation Age of Onset  . Heart failure Father   .  Cancer Father   . Alcoholism Brother     Social History:  reports that he has been smoking Cigarettes and Cigars.  He has never used smokeless tobacco. He reports that he does not drink alcohol or use drugs.  Allergies:  Allergies  Allergen Reactions  . Oxycodone-Acetaminophen     Severe rash and itching  . Oxycodone Hcl     itching  . Percocet [Oxycodone-Acetaminophen] Other (See Comments)    itching    Medications: Prior to Admission medications   Medication Sig Start Date End Date Taking? Authorizing Provider  ALPRAZolam Duanne Moron) 1 MG tablet Take 1 mg by mouth 3 (three) times daily as needed for anxiety.   Yes Historical Provider, MD  oxyCODONE-acetaminophen (PERCOCET) 10-325 MG tablet Take 1 tablet by mouth 4 (four) times daily as needed for pain.   Yes Historical Provider, MD  albuterol (PROVENTIL HFA;VENTOLIN HFA) 108 (90 BASE) MCG/ACT inhaler Inhale 2 puffs into the lungs every 6 (six) hours as needed. For shortness of breath    Historical Provider, MD    Scheduled Meds: . chlorhexidine  15 mL Mouth/Throat BID  . enoxaparin (LOVENOX) injection  40 mg Subcutaneous Q24H  . folic acid  1 mg Intravenous Daily  . haloperidol  5 mg Oral TID   Or  . haloperidol lactate  5 mg Intramuscular TID  . insulin aspart  0-9 Units Subcutaneous Q4H  . mouth rinse  15 mL Mouth Rinse QID  .  sodium chloride flush  3 mL Intravenous Q12H  . thiamine  100 mg Intravenous Daily   Continuous Infusions: . sodium chloride 110 mL/hr at 06/21/16 1100  . dexmedetomidine (PRECEDEX) IV infusion Stopped (06/21/16 1100)  . famotidine (PEPCID) IV Stopped (06/21/16 0941)  . levETIRAcetam Stopped (06/21/16 1635)  . piperacillin-tazobactam (ZOSYN)  IV 3.375 g (06/21/16 1706)  . valproate sodium Stopped (06/21/16 1353)   PRN Meds:.acetaminophen **OR** acetaminophen, bisacodyl, docusate, fentaNYL (SUBLIMAZE) injection, fentaNYL (SUBLIMAZE) injection, haloperidol lactate, ipratropium-albuterol, LORazepam,  ondansetron **OR** ondansetron (ZOFRAN) IV     Results for orders placed or performed during the hospital encounter of 06/12/16 (from the past 48 hour(s))  Glucose, capillary     Status: Abnormal   Collection Time: 06/19/16  7:41 PM  Result Value Ref Range   Glucose-Capillary 108 (H) 65 - 99 mg/dL   Comment 1 Notify RN   Glucose, capillary     Status: Abnormal   Collection Time: 06/19/16 11:54 PM  Result Value Ref Range   Glucose-Capillary 100 (H) 65 - 99 mg/dL   Comment 1 Notify RN   Glucose, capillary     Status: None   Collection Time: 06/20/16  4:21 AM  Result Value Ref Range   Glucose-Capillary 97 65 - 99 mg/dL   Comment 1 Notify RN   Glucose, capillary     Status: Abnormal   Collection Time: 06/20/16  7:40 AM  Result Value Ref Range   Glucose-Capillary 107 (H) 65 - 99 mg/dL  Comprehensive metabolic panel     Status: Abnormal   Collection Time: 06/20/16  9:11 AM  Result Value Ref Range   Sodium 141 135 - 145 mmol/L   Potassium 2.8 (L) 3.5 - 5.1 mmol/L   Chloride 105 101 - 111 mmol/L   CO2 27 22 - 32 mmol/L   Glucose, Bld 110 (H) 65 - 99 mg/dL   BUN 6 6 - 20 mg/dL   Creatinine, Ser 0.66 0.61 - 1.24 mg/dL   Calcium 8.2 (L) 8.9 - 10.3 mg/dL   Total Protein 7.0 6.5 - 8.1 g/dL   Albumin 2.9 (L) 3.5 - 5.0 g/dL   AST 21 15 - 41 U/L   ALT 15 (L) 17 - 63 U/L   Alkaline Phosphatase 53 38 - 126 U/L   Total Bilirubin 0.7 0.3 - 1.2 mg/dL   GFR calc non Af Amer >60 >60 mL/min   GFR calc Af Amer >60 >60 mL/min    Comment: (NOTE) The eGFR has been calculated using the CKD EPI equation. This calculation has not been validated in all clinical situations. eGFR's persistently <60 mL/min signify possible Chronic Kidney Disease.    Anion gap 9 5 - 15  CBC with Differential/Platelet     Status: Abnormal   Collection Time: 06/20/16  9:11 AM  Result Value Ref Range   WBC 8.4 4.0 - 10.5 K/uL   RBC 4.62 4.22 - 5.81 MIL/uL   Hemoglobin 15.0 13.0 - 17.0 g/dL   HCT 41.4 39.0 - 52.0 %     MCV 89.6 78.0 - 100.0 fL   MCH 32.5 26.0 - 34.0 pg   MCHC 36.2 (H) 30.0 - 36.0 g/dL   RDW 11.8 11.5 - 15.5 %   Platelets 190 150 - 400 K/uL   Neutrophils Relative % 75 %   Neutro Abs 6.3 1.7 - 7.7 K/uL   Lymphocytes Relative 15 %   Lymphs Abs 1.3 0.7 - 4.0 K/uL   Monocytes Relative 9 %  Monocytes Absolute 0.8 0.1 - 1.0 K/uL   Eosinophils Relative 1 %   Eosinophils Absolute 0.1 0.0 - 0.7 K/uL   Basophils Relative 0 %   Basophils Absolute 0.0 0.0 - 0.1 K/uL  Glucose, capillary     Status: None   Collection Time: 06/20/16 11:28 AM  Result Value Ref Range   Glucose-Capillary 97 65 - 99 mg/dL  Glucose, capillary     Status: Abnormal   Collection Time: 06/20/16  5:06 PM  Result Value Ref Range   Glucose-Capillary 105 (H) 65 - 99 mg/dL  Glucose, capillary     Status: None   Collection Time: 06/20/16  7:58 PM  Result Value Ref Range   Glucose-Capillary 98 65 - 99 mg/dL  Glucose, capillary     Status: Abnormal   Collection Time: 06/20/16 11:20 PM  Result Value Ref Range   Glucose-Capillary 113 (H) 65 - 99 mg/dL  Glucose, capillary     Status: Abnormal   Collection Time: 06/21/16  3:08 AM  Result Value Ref Range   Glucose-Capillary 112 (H) 65 - 99 mg/dL  Basic metabolic panel     Status: Abnormal   Collection Time: 06/21/16  4:27 AM  Result Value Ref Range   Sodium 142 135 - 145 mmol/L   Potassium 2.9 (L) 3.5 - 5.1 mmol/L   Chloride 103 101 - 111 mmol/L   CO2 29 22 - 32 mmol/L   Glucose, Bld 112 (H) 65 - 99 mg/dL   BUN 6 6 - 20 mg/dL   Creatinine, Ser 0.63 0.61 - 1.24 mg/dL   Calcium 8.7 (L) 8.9 - 10.3 mg/dL   GFR calc non Af Amer >60 >60 mL/min   GFR calc Af Amer >60 >60 mL/min    Comment: (NOTE) The eGFR has been calculated using the CKD EPI equation. This calculation has not been validated in all clinical situations. eGFR's persistently <60 mL/min signify possible Chronic Kidney Disease.    Anion gap 10 5 - 15  Glucose, capillary     Status: Abnormal   Collection  Time: 06/21/16  7:59 AM  Result Value Ref Range   Glucose-Capillary 120 (H) 65 - 99 mg/dL  Glucose, capillary     Status: Abnormal   Collection Time: 06/21/16 12:57 PM  Result Value Ref Range   Glucose-Capillary 106 (H) 65 - 99 mg/dL  Glucose, capillary     Status: Abnormal   Collection Time: 06/21/16  5:03 PM  Result Value Ref Range   Glucose-Capillary 109 (H) 65 - 99 mg/dL    Studies/Results:     Hristopher Missildine A. [name], M.D.  Diplomate, Tax adviser of Psychiatry and Neurology ( Neurology). 06/21/2016, 6:45 PM

## 2016-06-21 NOTE — Progress Notes (Signed)
[place] A. [name], MD     [email or web address]          [name] is an 51 y.o. male.   ASSESSMENT/PLAN: 1. Resolved status epilepticus due to abrupt discontinuation of antiepileptic medications. No recurrent seizures on Depakote and Keppra.  2. Encephalopathy which is multifactorial including medication effect and the dose ictal lethargy. He is on significant sedation. The dose of Haldol will be increased to help with the behavioral issues and with the hopes of weaning off all sedatives.  3. History of traumatic brain injury. 4. Suspect underlying polyneuropathy.    The nursing staff reports that the patient had a bad day with a lot agitation. Given 3 mg ativan x 2.     GENERAL: No seizures observed. He is quite sedated.   HEENT: Supple. Atraumatic normocephalic.   ABDOMEN: soft  EXTREMITIES: No edema   BACK: Normal.  SKIN: Normal by inspection.    MENTAL STATUS: He lays in bed with eyes closed. He open his eyes with light tactile stimulation. The patient does verbalizes "Ok" . He is resistant to the evaluation however and did through a few punches and the air. He did not follow commands.   CRANIAL NERVES: Pupils small but equal, round and reactive to light; extra ocular movements appear still be intact; upper and lower facial muscles are normal in strength and symmetric, there is no flattening of the nasolabial folds.   MOTOR: Mild withdrawal against gravity in the arms and legs. Bulk and tone are mostly unremarkable.   COORDINATION: No tremors are appreciated. No parkinsonism appreciated.   SENSATION: No response to deep painful stimuli bilaterally.    Blood pressure (!) 169/104, pulse (!) 108, temperature 99.7 F (37.6 C), resp. rate (!) 22, height _0  (1.88 m), weight 206 lb 9.1 oz (93.7 kg), SpO2 96 %.  Past Medical History:  Diagnosis Date  . Arthritis   . Asthma   . Chronic neck pain   . COPD (chronic obstructive pulmonary  disease) ([place])   . Injury of jawline   . Mental disorder   . Ruptured disc, cervical   . Seizures ([place])   . Torn rotator cuff     History reviewed. No pertinent surgical history.  Family History  Problem Relation Age of Onset  . Heart failure Father   . Cancer Father   . Alcoholism Brother     Social History:  reports that he has been smoking Cigarettes and Cigars.  He has never used smokeless tobacco. He reports that he does not drink alcohol or use drugs.  Allergies:  Allergies  Allergen Reactions  . Oxycodone-Acetaminophen     Severe rash and itching  . Oxycodone Hcl     itching  . Percocet [Oxycodone-Acetaminophen] Other (See Comments)    itching    Medications: Prior to Admission medications   Medication Sig Start Date End Date Taking? Authorizing Provider  ALPRAZolam Duanne Moron) 1 MG tablet Take 1 mg by mouth 3 (three) times daily as needed for anxiety.   Yes Historical Provider, MD  oxyCODONE-acetaminophen (PERCOCET) 10-325 MG tablet Take 1 tablet by mouth 4 (four) times daily as needed for pain.   Yes Historical Provider, MD  albuterol (PROVENTIL HFA;VENTOLIN HFA) 108 (90 BASE) MCG/ACT inhaler Inhale 2 puffs into the lungs every 6 (six) hours as needed. For shortness of breath    Historical Provider, MD    Scheduled Meds: . chlorhexidine  15 mL Mouth/Throat BID  . enoxaparin (LOVENOX) injection  40 mg Subcutaneous Q24H  . folic acid  1 mg Intravenous Daily  . haloperidol lactate  5 mg Intramuscular BID   Or  . haloperidol  5 mg Oral BID  . insulin aspart  0-9 Units Subcutaneous Q4H  . mouth rinse  15 mL Mouth Rinse QID  . sodium chloride flush  3 mL Intravenous Q12H  . thiamine  100 mg Intravenous Daily   Continuous Infusions: . sodium chloride 110 mL/hr at 06/21/16 0049  . dexmedetomidine (PRECEDEX) IV infusion 0.4 mcg/kg/hr (06/21/16 0048)  . famotidine (PEPCID) IV Stopped (06/20/16 2204)  . levETIRAcetam Stopped (06/20/16 2150)  . piperacillin-tazobactam  (ZOSYN)  IV Stopped (06/20/16 2208)  . valproate sodium Stopped (06/20/16 2234)   PRN Meds:.acetaminophen **OR** acetaminophen, bisacodyl, docusate, fentaNYL (SUBLIMAZE) injection, fentaNYL (SUBLIMAZE) injection, haloperidol lactate, ipratropium-albuterol, LORazepam, ondansetron **OR** ondansetron (ZOFRAN) IV     Results for orders placed or performed during the hospital encounter of 06/12/16 (from the past 48 hour(s))  Glucose, capillary     Status: None   Collection Time: 06/19/16  3:56 AM  Result Value Ref Range   Glucose-Capillary 91 65 - 99 mg/dL   Comment 1 Notify RN   Creatinine, serum     Status: None   Collection Time: 06/19/16  4:46 AM  Result Value Ref Range   Creatinine, Ser 0.61 0.61 - 1.24 mg/dL   GFR calc non Af Amer >60 >60 mL/min   GFR calc Af Amer >60 >60 mL/min    Comment: (NOTE) The eGFR has been calculated using the CKD EPI equation. This calculation has not been validated in all clinical situations. eGFR's persistently <60 mL/min signify possible Chronic Kidney Disease.   Glucose, capillary     Status: Abnormal   Collection Time: 06/19/16  7:46 AM  Result Value Ref Range   Glucose-Capillary 106 (H) 65 - 99 mg/dL  Glucose, capillary     Status: Abnormal   Collection Time: 06/19/16 11:23 AM  Result Value Ref Range   Glucose-Capillary 105 (H) 65 - 99 mg/dL  Glucose, capillary     Status: Abnormal   Collection Time: 06/19/16  4:10 PM  Result Value Ref Range   Glucose-Capillary 105 (H) 65 - 99 mg/dL  Glucose, capillary     Status: Abnormal   Collection Time: 06/19/16  7:41 PM  Result Value Ref Range   Glucose-Capillary 108 (H) 65 - 99 mg/dL   Comment 1 Notify RN   Glucose, capillary     Status: Abnormal   Collection Time: 06/19/16 11:54 PM  Result Value Ref Range   Glucose-Capillary 100 (H) 65 - 99 mg/dL   Comment 1 Notify RN   Glucose, capillary     Status: None   Collection Time: 06/20/16  4:21 AM  Result Value Ref Range   Glucose-Capillary 97 65  - 99 mg/dL   Comment 1 Notify RN   Glucose, capillary     Status: Abnormal   Collection Time: 06/20/16  7:40 AM  Result Value Ref Range   Glucose-Capillary 107 (H) 65 - 99 mg/dL  Comprehensive metabolic panel     Status: Abnormal   Collection Time: 06/20/16  9:11 AM  Result Value Ref Range   Sodium 141 135 - 145 mmol/L   Potassium 2.8 (L) 3.5 - 5.1 mmol/L   Chloride 105 101 - 111 mmol/L   CO2 27 22 - 32 mmol/L   Glucose, Bld 110 (H) 65 - 99 mg/dL   BUN 6 6 - 20 mg/dL  Creatinine, Ser 0.66 0.61 - 1.24 mg/dL   Calcium 8.2 (L) 8.9 - 10.3 mg/dL   Total Protein 7.0 6.5 - 8.1 g/dL   Albumin 2.9 (L) 3.5 - 5.0 g/dL   AST 21 15 - 41 U/L   ALT 15 (L) 17 - 63 U/L   Alkaline Phosphatase 53 38 - 126 U/L   Total Bilirubin 0.7 0.3 - 1.2 mg/dL   GFR calc non Af Amer >60 >60 mL/min   GFR calc Af Amer >60 >60 mL/min    Comment: (NOTE) The eGFR has been calculated using the CKD EPI equation. This calculation has not been validated in all clinical situations. eGFR's persistently <60 mL/min signify possible Chronic Kidney Disease.    Anion gap 9 5 - 15  CBC with Differential/Platelet     Status: Abnormal   Collection Time: 06/20/16  9:11 AM  Result Value Ref Range   WBC 8.4 4.0 - 10.5 K/uL   RBC 4.62 4.22 - 5.81 MIL/uL   Hemoglobin 15.0 13.0 - 17.0 g/dL   HCT 41.4 39.0 - 52.0 %   MCV 89.6 78.0 - 100.0 fL   MCH 32.5 26.0 - 34.0 pg   MCHC 36.2 (H) 30.0 - 36.0 g/dL   RDW 11.8 11.5 - 15.5 %   Platelets 190 150 - 400 K/uL   Neutrophils Relative % 75 %   Neutro Abs 6.3 1.7 - 7.7 K/uL   Lymphocytes Relative 15 %   Lymphs Abs 1.3 0.7 - 4.0 K/uL   Monocytes Relative 9 %   Monocytes Absolute 0.8 0.1 - 1.0 K/uL   Eosinophils Relative 1 %   Eosinophils Absolute 0.1 0.0 - 0.7 K/uL   Basophils Relative 0 %   Basophils Absolute 0.0 0.0 - 0.1 K/uL  Glucose, capillary     Status: None   Collection Time: 06/20/16 11:28 AM  Result Value Ref Range   Glucose-Capillary 97 65 - 99 mg/dL  Glucose,  capillary     Status: Abnormal   Collection Time: 06/20/16  5:06 PM  Result Value Ref Range   Glucose-Capillary 105 (H) 65 - 99 mg/dL  Glucose, capillary     Status: None   Collection Time: 06/20/16  7:58 PM  Result Value Ref Range   Glucose-Capillary 98 65 - 99 mg/dL  Glucose, capillary     Status: Abnormal   Collection Time: 06/20/16 11:20 PM  Result Value Ref Range   Glucose-Capillary 113 (H) 65 - 99 mg/dL    Studies/Results:     Toneisha Savary A. [name], M.D.  Diplomate, Tax adviser of Psychiatry and Neurology ( Neurology). 06/21/2016, 12:49 AM

## 2016-06-21 NOTE — Progress Notes (Signed)
Subjective: He remains with significantly altered mental status. He is on the lowest dose of Precedex and probably can come off. He did not eat yesterday. I don't think he is a good candidate for a nasal or oral feeding tube because I think he'll pull it out. Less combative  Objective: Vital signs in last 24 hours: Temp:  [97.2 F (36.2 C)-99.7 F (37.6 C)] 98.4 F (36.9 C) (05/10 0745) Pulse Rate:  [78-116] 79 (05/10 0745) Resp:  [11-37] 30 (05/10 0745) BP: (122-187)/(75-108) 122/94 (05/10 0700) SpO2:  [87 %-98 %] 87 % (05/10 0745) Weight:  [93 kg (205 lb 0.4 oz)] 93 kg (205 lb 0.4 oz) (05/10 0300) Weight change: -0.7 kg (-1 lb 8.7 oz) Last BM Date: 06/20/16  Intake/Output from previous day: 05/09 0701 - 05/10 0700 In: 3742.6 [I.V.:2637.6; IV Piggyback:1105] Out: 4101 [Urine:4100; Stool:1]  PHYSICAL EXAM General appearance: More alert but still significantly altered Resp: clear to auscultation bilaterally Cardio: regular rate and rhythm, S1, S2 normal, no murmur, click, rub or gallop GI: soft, non-tender; bowel sounds normal; no masses,  no organomegaly Extremities: extremities normal, atraumatic, no cyanosis or edema Skin warm and dry  Lab Results:  Results for orders placed or performed during the hospital encounter of 06/12/16 (from the past 48 hour(s))  Glucose, capillary     Status: Abnormal   Collection Time: 06/19/16 11:23 AM  Result Value Ref Range   Glucose-Capillary 105 (H) 65 - 99 mg/dL  Glucose, capillary     Status: Abnormal   Collection Time: 06/19/16  4:10 PM  Result Value Ref Range   Glucose-Capillary 105 (H) 65 - 99 mg/dL  Glucose, capillary     Status: Abnormal   Collection Time: 06/19/16  7:41 PM  Result Value Ref Range   Glucose-Capillary 108 (H) 65 - 99 mg/dL   Comment 1 Notify RN   Glucose, capillary     Status: Abnormal   Collection Time: 06/19/16 11:54 PM  Result Value Ref Range   Glucose-Capillary 100 (H) 65 - 99 mg/dL   Comment 1 Notify RN    Glucose, capillary     Status: None   Collection Time: 06/20/16  4:21 AM  Result Value Ref Range   Glucose-Capillary 97 65 - 99 mg/dL   Comment 1 Notify RN   Glucose, capillary     Status: Abnormal   Collection Time: 06/20/16  7:40 AM  Result Value Ref Range   Glucose-Capillary 107 (H) 65 - 99 mg/dL  Comprehensive metabolic panel     Status: Abnormal   Collection Time: 06/20/16  9:11 AM  Result Value Ref Range   Sodium 141 135 - 145 mmol/L   Potassium 2.8 (L) 3.5 - 5.1 mmol/L   Chloride 105 101 - 111 mmol/L   CO2 27 22 - 32 mmol/L   Glucose, Bld 110 (H) 65 - 99 mg/dL   BUN 6 6 - 20 mg/dL   Creatinine, Ser 0.66 0.61 - 1.24 mg/dL   Calcium 8.2 (L) 8.9 - 10.3 mg/dL   Total Protein 7.0 6.5 - 8.1 g/dL   Albumin 2.9 (L) 3.5 - 5.0 g/dL   AST 21 15 - 41 U/L   ALT 15 (L) 17 - 63 U/L   Alkaline Phosphatase 53 38 - 126 U/L   Total Bilirubin 0.7 0.3 - 1.2 mg/dL   GFR calc non Af Amer >60 >60 mL/min   GFR calc Af Amer >60 >60 mL/min    Comment: (NOTE) The eGFR has been calculated  using the CKD EPI equation. This calculation has not been validated in all clinical situations. eGFR's persistently <60 mL/min signify possible Chronic Kidney Disease.    Anion gap 9 5 - 15  CBC with Differential/Platelet     Status: Abnormal   Collection Time: 06/20/16  9:11 AM  Result Value Ref Range   WBC 8.4 4.0 - 10.5 K/uL   RBC 4.62 4.22 - 5.81 MIL/uL   Hemoglobin 15.0 13.0 - 17.0 g/dL   HCT 41.4 39.0 - 52.0 %   MCV 89.6 78.0 - 100.0 fL   MCH 32.5 26.0 - 34.0 pg   MCHC 36.2 (H) 30.0 - 36.0 g/dL   RDW 11.8 11.5 - 15.5 %   Platelets 190 150 - 400 K/uL   Neutrophils Relative % 75 %   Neutro Abs 6.3 1.7 - 7.7 K/uL   Lymphocytes Relative 15 %   Lymphs Abs 1.3 0.7 - 4.0 K/uL   Monocytes Relative 9 %   Monocytes Absolute 0.8 0.1 - 1.0 K/uL   Eosinophils Relative 1 %   Eosinophils Absolute 0.1 0.0 - 0.7 K/uL   Basophils Relative 0 %   Basophils Absolute 0.0 0.0 - 0.1 K/uL  Glucose, capillary      Status: None   Collection Time: 06/20/16 11:28 AM  Result Value Ref Range   Glucose-Capillary 97 65 - 99 mg/dL  Glucose, capillary     Status: Abnormal   Collection Time: 06/20/16  5:06 PM  Result Value Ref Range   Glucose-Capillary 105 (H) 65 - 99 mg/dL  Glucose, capillary     Status: None   Collection Time: 06/20/16  7:58 PM  Result Value Ref Range   Glucose-Capillary 98 65 - 99 mg/dL  Glucose, capillary     Status: Abnormal   Collection Time: 06/20/16 11:20 PM  Result Value Ref Range   Glucose-Capillary 113 (H) 65 - 99 mg/dL  Glucose, capillary     Status: Abnormal   Collection Time: 06/21/16  3:08 AM  Result Value Ref Range   Glucose-Capillary 112 (H) 65 - 99 mg/dL  Basic metabolic panel     Status: Abnormal   Collection Time: 06/21/16  4:27 AM  Result Value Ref Range   Sodium 142 135 - 145 mmol/L   Potassium 2.9 (L) 3.5 - 5.1 mmol/L   Chloride 103 101 - 111 mmol/L   CO2 29 22 - 32 mmol/L   Glucose, Bld 112 (H) 65 - 99 mg/dL   BUN 6 6 - 20 mg/dL   Creatinine, Ser 0.63 0.61 - 1.24 mg/dL   Calcium 8.7 (L) 8.9 - 10.3 mg/dL   GFR calc non Af Amer >60 >60 mL/min   GFR calc Af Amer >60 >60 mL/min    Comment: (NOTE) The eGFR has been calculated using the CKD EPI equation. This calculation has not been validated in all clinical situations. eGFR's persistently <60 mL/min signify possible Chronic Kidney Disease.    Anion gap 10 5 - 15  Glucose, capillary     Status: Abnormal   Collection Time: 06/21/16  7:59 AM  Result Value Ref Range   Glucose-Capillary 120 (H) 65 - 99 mg/dL    ABGS No results for input(s): PHART, PO2ART, TCO2, HCO3 in the last 72 hours.  Invalid input(s): PCO2 CULTURES Recent Results (from the past 240 hour(s))  MRSA PCR Screening     Status: None   Collection Time: 06/12/16  5:55 PM  Result Value Ref Range Status   MRSA by PCR  NEGATIVE NEGATIVE Final    Comment:        The GeneXpert MRSA Assay (FDA approved for NASAL specimens only), is one  component of a comprehensive MRSA colonization surveillance program. It is not intended to diagnose MRSA infection nor to guide or monitor treatment for MRSA infections.    Studies/Results: No results found.  Medications:  Prior to Admission:  Prescriptions Prior to Admission  Medication Sig Dispense Refill Last Dose  . albuterol (PROVENTIL HFA;VENTOLIN HFA) 108 (90 BASE) MCG/ACT inhaler Inhale 2 puffs into the lungs every 6 (six) hours as needed. For shortness of breath   unknown  . ALPRAZolam (XANAX) 1 MG tablet Take 1 mg by mouth 3 (three) times daily as needed for anxiety.   06/12/2016 at Unknown time  . oxyCODONE-acetaminophen (PERCOCET) 10-325 MG tablet Take 1 tablet by mouth 4 (four) times daily as needed for pain.   Past Week at Unknown time   Scheduled: . chlorhexidine  15 mL Mouth/Throat BID  . enoxaparin (LOVENOX) injection  40 mg Subcutaneous Q24H  . folic acid  1 mg Intravenous Daily  . haloperidol  5 mg Oral TID   Or  . haloperidol lactate  5 mg Intramuscular TID  . insulin aspart  0-9 Units Subcutaneous Q4H  . mouth rinse  15 mL Mouth Rinse QID  . sodium chloride flush  3 mL Intravenous Q12H  . thiamine  100 mg Intravenous Daily   Continuous: . sodium chloride 110 mL/hr at 06/21/16 0700  . dexmedetomidine (PRECEDEX) IV infusion 0.4 mcg/kg/hr (06/21/16 0700)  . famotidine (PEPCID) IV Stopped (06/20/16 2204)  . levETIRAcetam Stopped (06/20/16 2150)  . piperacillin-tazobactam (ZOSYN)  IV Stopped (06/21/16 0700)  . valproate sodium 500 mg (06/21/16 0514)   ZOX:[id number] **OR** acetaminophen, bisacodyl, docusate, fentaNYL (SUBLIMAZE) injection, fentaNYL (SUBLIMAZE) injection, haloperidol lactate, ipratropium-albuterol, LORazepam, ondansetron **OR** ondansetron (ZOFRAN) IV  Assesment: He was admitted with seizures and acute alcohol intoxication. He had taken himself off his seizure medication. He has history of traumatic brain injury. He was intubated for airway  protection but self extubated. He does have severe protein calorie malnutrition and he's not eating at this point but I don't think we'll be able to keep any sort of a oral or nasal gastric tube in. Try to get him off the Precedex today and see if he is more alert and perhaps will eat. He still continues to have metabolic encephalopathy which is multifactorial. He has aspiration pneumonia being treated with IV antibiotics. Principal Problem:   Seizures ([place]) Active Problems:   Alcohol abuse with intoxication ([place])   COPD (chronic obstructive pulmonary disease) ([place])   History of traumatic brain injury   Endotracheally intubated   Respiratory failure (HCC)   Aspiration pneumonia (HCC)   Metabolic encephalopathy   Protein-calorie malnutrition, severe    Plan: Continue treatments. Try to get him off of Precedex.    LOS: 9 days   Dameshia Seybold L 06/21/2016, 8:29 AM

## 2016-06-21 NOTE — Progress Notes (Signed)
eLink Physician-Brief Progress Note Patient Name: [name] DOB: 1965-02-21 MRN: [id number]   Date of Service  06/21/2016  HPI/Events of Note  VT noted by RN, prolonged Qt, currently on Haldol. Low K.   eICU Interventions  Potassium replacement now via Picc, recheck at 0200. Stat Mag 2mg  IV, Check Calcium level now.  DC haldol, ordered ativan q1hprn, was previously on precedex, could restart if needed.      Intervention Category Major Interventions: Arrhythmia - evaluation and management  Shane Crutchradeep Kae Lauman 06/21/2016, 9:19 PM

## 2016-06-22 ENCOUNTER — Inpatient Hospital Stay (HOSPITAL_COMMUNITY): Payer: Medicaid Other

## 2016-06-22 DIAGNOSIS — I472 Ventricular tachycardia: Secondary | ICD-10-CM

## 2016-06-22 LAB — CBC WITH DIFFERENTIAL/PLATELET
Basophils Absolute: 0 10*3/uL (ref 0.0–0.1)
Basophils Relative: 0 %
Eosinophils Absolute: 0.1 10*3/uL (ref 0.0–0.7)
Eosinophils Relative: 1 %
HCT: 42.2 % (ref 39.0–52.0)
HEMOGLOBIN: 14.6 g/dL (ref 13.0–17.0)
Lymphocytes Relative: 11 %
Lymphs Abs: 1.5 10*3/uL (ref 0.7–4.0)
MCH: 32.2 pg (ref 26.0–34.0)
MCHC: 34.6 g/dL (ref 30.0–36.0)
MCV: 93.2 fL (ref 78.0–100.0)
MONOS PCT: 9 %
Monocytes Absolute: 1.2 10*3/uL — ABNORMAL HIGH (ref 0.1–1.0)
NEUTROS PCT: 79 %
Neutro Abs: 10.1 10*3/uL — ABNORMAL HIGH (ref 1.7–7.7)
Platelets: 210 10*3/uL (ref 150–400)
RBC: 4.53 MIL/uL (ref 4.22–5.81)
RDW: 12.3 % (ref 11.5–15.5)
WBC: 12.9 10*3/uL — ABNORMAL HIGH (ref 4.0–10.5)

## 2016-06-22 LAB — COMPREHENSIVE METABOLIC PANEL
ALBUMIN: 3.1 g/dL — AB (ref 3.5–5.0)
ALK PHOS: 54 U/L (ref 38–126)
ALT: 18 U/L (ref 17–63)
AST: 22 U/L (ref 15–41)
Anion gap: 10 (ref 5–15)
BUN: 5 mg/dL — ABNORMAL LOW (ref 6–20)
CALCIUM: 8.2 mg/dL — AB (ref 8.9–10.3)
CO2: 29 mmol/L (ref 22–32)
CREATININE: 0.59 mg/dL — AB (ref 0.61–1.24)
Chloride: 102 mmol/L (ref 101–111)
Glucose, Bld: 155 mg/dL — ABNORMAL HIGH (ref 65–99)
Potassium: 3.8 mmol/L (ref 3.5–5.1)
SODIUM: 141 mmol/L (ref 135–145)
Total Bilirubin: 0.7 mg/dL (ref 0.3–1.2)
Total Protein: 7.4 g/dL (ref 6.5–8.1)

## 2016-06-22 LAB — GLUCOSE, CAPILLARY
GLUCOSE-CAPILLARY: 99 mg/dL (ref 65–99)
GLUCOSE-CAPILLARY: 101 mg/dL — AB (ref 65–99)
GLUCOSE-CAPILLARY: 124 mg/dL — AB (ref 65–99)
Glucose-Capillary: 101 mg/dL — ABNORMAL HIGH (ref 65–99)
Glucose-Capillary: 103 mg/dL — ABNORMAL HIGH (ref 65–99)
Glucose-Capillary: 127 mg/dL — ABNORMAL HIGH (ref 65–99)
Glucose-Capillary: 76 mg/dL (ref 65–99)

## 2016-06-22 LAB — BLOOD GAS, ARTERIAL
Acid-Base Excess: 1.6 mmol/L (ref 0.0–2.0)
Bicarbonate: 25.4 mmol/L (ref 20.0–28.0)
Drawn by: 234301
FIO2: 100
LHR: 16 {breaths}/min
MECHVT: 660 mL
O2 SAT: 91.2 %
PEEP: 5 cmH2O
Patient temperature: 37
pCO2 arterial: 43.6 mmHg (ref 32.0–48.0)
pH, Arterial: 7.394 (ref 7.350–7.450)
pO2, Arterial: 64.8 mmHg — ABNORMAL LOW (ref 83.0–108.0)

## 2016-06-22 LAB — BASIC METABOLIC PANEL
Anion gap: 9 (ref 5–15)
BUN: 5 mg/dL — AB (ref 6–20)
CHLORIDE: 105 mmol/L (ref 101–111)
CO2: 28 mmol/L (ref 22–32)
CREATININE: 0.6 mg/dL — AB (ref 0.61–1.24)
Calcium: 8.4 mg/dL — ABNORMAL LOW (ref 8.9–10.3)
GFR calc Af Amer: >60 mL/min (ref >60)
GLUCOSE: 101 mg/dL — AB (ref 65–99)
POTASSIUM: 2.8 mmol/L — AB (ref 3.5–5.1)
SODIUM: 142 mmol/L (ref 135–145)

## 2016-06-22 LAB — TROPONIN I
TROPONIN I: 0.06 ng/mL — AB (ref <0.03)
TROPONIN I: 0.07 ng/mL — AB (ref <0.03)
Troponin I: 0.05 ng/mL (ref <0.03)

## 2016-06-22 LAB — VALPROIC ACID LEVEL: Valproic Acid Lvl: 54 ug/mL (ref 50.0–100.0)

## 2016-06-22 LAB — ECHOCARDIOGRAM COMPLETE
HEIGHTINCHES: 74 in
WEIGHTICAEL: 3305.14 [oz_av]

## 2016-06-22 LAB — MAGNESIUM: Magnesium: 2.4 mg/dL (ref 1.7–2.4)

## 2016-06-22 LAB — TRIGLYCERIDES: Triglycerides: 133 mg/dL (ref <150)

## 2016-06-22 MED ORDER — CHLORHEXIDINE GLUCONATE 0.12% ORAL RINSE (MEDLINE KIT)
15.0000 mL | Freq: Two times a day (BID) | OROMUCOSAL | Status: DC
  Administered 2016-06-22 – 2016-06-30 (×16): 15 mL via OROMUCOSAL

## 2016-06-22 MED ORDER — ALBUTEROL SULFATE (2.5 MG/3ML) 0.083% IN NEBU
2.5000 mg | INHALATION_SOLUTION | RESPIRATORY_TRACT | Status: DC
  Administered 2016-06-22 – 2016-06-23 (×9): 2.5 mg via RESPIRATORY_TRACT
  Filled 2016-06-22 (×9): qty 3

## 2016-06-22 MED ORDER — PROPOFOL 1000 MG/100ML IV EMUL
0.0000 ug/kg/min | INTRAVENOUS | Status: DC
  Administered 2016-06-22: 15 ug/kg/min via INTRAVENOUS
  Administered 2016-06-22: 20 ug/kg/min via INTRAVENOUS
  Administered 2016-06-23: 35 ug/kg/min via INTRAVENOUS
  Administered 2016-06-23 (×2): 20 ug/kg/min via INTRAVENOUS
  Administered 2016-06-24: 35 ug/kg/min via INTRAVENOUS
  Administered 2016-06-24: 45 ug/kg/min via INTRAVENOUS
  Administered 2016-06-24 – 2016-06-25 (×6): 40 ug/kg/min via INTRAVENOUS
  Administered 2016-06-25 – 2016-06-26 (×4): 50 ug/kg/min via INTRAVENOUS
  Administered 2016-06-26: 40.021 ug/kg/min via INTRAVENOUS
  Administered 2016-06-26 – 2016-06-27 (×2): 50 ug/kg/min via INTRAVENOUS
  Administered 2016-06-27: 25 ug/kg/min via INTRAVENOUS
  Administered 2016-06-27: 50 ug/kg/min via INTRAVENOUS
  Filled 2016-06-22 (×25): qty 100

## 2016-06-22 MED ORDER — ORAL CARE MOUTH RINSE
15.0000 mL | Freq: Four times a day (QID) | OROMUCOSAL | Status: DC
  Administered 2016-06-22 – 2016-06-29 (×27): 15 mL via OROMUCOSAL

## 2016-06-22 MED ORDER — LEVETIRACETAM IN NACL 1000 MG/100ML IV SOLN
1000.0000 mg | Freq: Three times a day (TID) | INTRAVENOUS | Status: DC
  Administered 2016-06-22 – 2016-06-24 (×6): 1000 mg via INTRAVENOUS
  Filled 2016-06-22 (×14): qty 100

## 2016-06-22 MED ORDER — FENTANYL CITRATE (PF) 100 MCG/2ML IJ SOLN: 100.0000 ug | INTRAMUSCULAR | Status: DC | PRN

## 2016-06-22 MED ORDER — PROPOFOL 1000 MG/100ML IV EMUL
INTRAVENOUS | Status: AC
  Administered 2016-06-22: 20 mg/kg/h
  Filled 2016-06-22: qty 100

## 2016-06-22 MED ORDER — FENTANYL CITRATE (PF) 100 MCG/2ML IJ SOLN
100.0000 ug | INTRAMUSCULAR | Status: DC | PRN
  Administered 2016-06-22 – 2016-06-23 (×2): 100 ug via INTRAVENOUS
  Filled 2016-06-22 (×2): qty 2

## 2016-06-22 MED ORDER — POTASSIUM CHLORIDE 10 MEQ/100ML IV SOLN
10.0000 meq | INTRAVENOUS | Status: AC
  Administered 2016-06-22 (×6): 10 meq via INTRAVENOUS
  Filled 2016-06-22 (×2): qty 100
  Filled 2016-06-22: qty 50
  Filled 2016-06-22: qty 100
  Filled 2016-06-22: qty 50
  Filled 2016-06-22: qty 100
  Filled 2016-06-22 (×2): qty 50
  Filled 2016-06-22: qty 100
  Filled 2016-06-22 (×2): qty 50

## 2016-06-22 NOTE — ED Provider Notes (Signed)
[facility]  Department of Emergency Medicine   Code Blue CONSULT NOTE  Chief Complaint: unresponsive   Level V Caveat: Unresponsive  History of present illness: I was contacted by the hospital for a CODE BLUE cardiac arrest upstairs and presented to the patient's bedside. Patient has been admitted to hospital for seizures and pneumonia. Became more and more unresponsive this morning, and was difficult to wake by staff. He was noted to have abnormal posturing, but no seizure activity earlier. He subsequently became more an more hypoxic with spO2 low 80%, requiring BVM. Code blue called due to unresponsiveness and concern for need for intubation. No cardiac arrest.  ROS: Unable to obtain, Level V caveat  Scheduled Meds: . albuterol  2.5 mg Nebulization Q4H  . chlorhexidine  15 mL Mouth/Throat BID  . chlorhexidine gluconate (MEDLINE KIT)  15 mL Mouth Rinse BID  . enoxaparin (LOVENOX) injection  40 mg Subcutaneous Q24H  . folic acid  1 mg Intravenous Daily  . insulin aspart  0-9 Units Subcutaneous Q4H  . mouth rinse  15 mL Mouth Rinse QID  . mouth rinse  15 mL Mouth Rinse QID  . sodium chloride flush  3 mL Intravenous Q12H  . thiamine  100 mg Intravenous Daily   Continuous Infusions: . sodium chloride 110 mL/hr at 06/22/16 0900  . famotidine (PEPCID) IV Stopped (06/22/16 0953)  . levETIRAcetam 1,000 mg (06/22/16 1403)  . piperacillin-tazobactam (ZOSYN)  IV Stopped (06/22/16 1323)  . propofol (DIPRIVAN) infusion 20 mcg/kg/min (06/22/16 1300)  . valproate sodium 500 mg (06/22/16 1402)   PRN Meds:.acetaminophen **OR** acetaminophen, bisacodyl, docusate, fentaNYL (SUBLIMAZE) injection, fentaNYL (SUBLIMAZE) injection, hydrALAZINE, ipratropium-albuterol, LORazepam, LORazepam, ondansetron **OR** ondansetron (ZOFRAN) IV Past Medical History:  Diagnosis Date  . Arthritis   . Asthma   . Chronic neck pain   . COPD (chronic obstructive pulmonary disease) (HCC)   . Injury of  jawline   . Mental disorder   . Ruptured disc, cervical   . Seizures (HCC)   . Torn rotator cuff    History reviewed. No pertinent surgical history. Social History   Social History  . Marital status: Single    Spouse name: N/A  . Number of children: N/A  . Years of education: N/A   Occupational History  . Not on file.   Social History Main Topics  . Smoking status: Current Some Day Smoker    Types: Cigarettes, Cigars  . Smokeless tobacco: Never Used  . Alcohol use No     Comment: "monthy"  . Drug use: No  . Sexual activity: Not on file   Other Topics Concern  . Not on file   Social History Narrative  . No narrative on file   Allergies  Allergen Reactions  . Oxycodone-Acetaminophen     Severe rash and itching  . Oxycodone Hcl     itching  . Percocet [Oxycodone-Acetaminophen] Other (See Comments)    itching    Last set of Vital Signs (not current) Vitals:   06/22/16 1245 06/22/16 1300  BP:  95/67  Pulse: (!) 104 (!) 104  Resp: 16 16  Temp: 100.2 F (37.9 C)       Physical Exam Gen: unresponsive Cardiovascular: tachycardic rate, regular rate Resp: coarse breath sounds bilaterally on anterior ausculation, BVM assisted breath sounds but having spontaneous breath sounds Abd: nondistended  Neuro: GCS 3, unresponsive to pain  HEENT: No blood in posterior pharynx, gag reflex absent  Neck: supple Musculoskeletal: No deformity  Skin:   warm  Procedures  INTUBATION Performed by: Forde Dandy Required items: required blood products, implants, devices, and special equipment available Patient identity confirmed: provided demographic data and hospital-assigned identification number Time out: Immediately prior to procedure a "time out" was called to verify the correct patient, procedure, equipment, support staff and site/side marked as required. Indications: airway protection, acute respiratory failure Intubation method: glidescope Preoxygenation: BVM Sedatives:  etomidate Paralytic: succinylchoine Tube Size: 8 cuffed Post-procedure assessment: chest rise and ETCO2 monitor Breath sounds: equal and absent over the epigastrium Tube secured by Respiratory Therapy Patient tolerated the procedure well with no immediate complications.  CRITICAL CARE Performed by: Forde Dandy Total critical care time: 35 Critical care time was exclusive of separately billable procedures and treating other patients. Critical care was necessary to treat or prevent imminent or life-threatening deterioration. Critical care was time spent personally by me on the following activities: development of treatment plan with patient and/or surrogate as well as nursing, discussions with consultants, evaluation of patient's response to treatment, examination of patient, obtaining history from patient or surrogate, ordering and performing treatments and interventions, ordering and review of laboratory studies, ordering and review of radiographic studies, pulse oximetry and re-evaluation of patient's condition.   Medical Decision making  On arrival patient hypoxic to 88% while receiving BVM assisted respirations. He is unresponsive, not responding to any painful stimuli. Decision made to intubate patient for acute hypoxic respiratory failure as well as inability to protect his airway.  Assessment and Plan   Patient intubated without difficulty. Dr. Kathaleen Grinder at bedside and will be taking over for patient care. He has agreed to follow-up on post-intubation chest x-ray to make sure tube is in place.      Forde Dandy, MD 06/22/16 1524

## 2016-06-22 NOTE — Progress Notes (Addendum)
CRITICAL VALUE ALERT  Critical value received: Troponin 0.06  Date of notification:  06/22/16  Time of notification:  1230am  Critical value read back:yes  Nurse who received alert:  Nance Pewiffani Airon Sahni,RN  MD notified (1st page):  Pola CornElink MD  Time of first page:  1238  MD notified (2nd page):n/a  Time of second page:n/a  Responding MD:  Pola CornElink MD Levy Pupaobert Byrum  Time MD responded:  1239  Cristie Hemiffani K Branna Cortina, RN 12:44 AM 06/22/16

## 2016-06-22 NOTE — Progress Notes (Signed)
Have started decreasing oxygen saturation still at 98, FiO2 at 60 , HR 130, patient still appear restless. Turning head; secretions in et tube appear to be decreasing.

## 2016-06-22 NOTE — Progress Notes (Signed)
Advanced ET tube to 27 from 25, X-rar showed 6 above carina. Repeat x-ray in progress. Will check results, Patient still on 100 percent will recheck abg..Marland Kitchen

## 2016-06-22 NOTE — Progress Notes (Signed)
PT'S O2 SATS DROPPING INTO THE 80'S DESPITE  O2 VIA NRB AT 100%.  PT POSTURING. LOST GAG REFLEX. . DR HAWKINS CALLED. COBE BLUE CALLED FOR INTUBATION.NO CARDIAC ABNORMALITIES AT THIS TIME. DR Boykin NearingHAWKINS,DR BRANCH ,DR LE AND ED MD AT BEDSIDE FOR INTUBATION.

## 2016-06-22 NOTE — Progress Notes (Signed)
X-ray  shows 4 above carina, tube at 27 at lip. Patient still 100% oxygen

## 2016-06-22 NOTE — Progress Notes (Addendum)
Nutrition Follow-up  DOCUMENTATION CODES:  Severe malnutrition in context of acute illness/injury  INTERVENTION:  Upon MD approval, will Initiate TF via OGT with  Vital AF 1.2 at goal rate of 65 ml/h (1560 ml per day) to provide 1872 (+296 from diprivan) kcals, 117 gm protein, 1265 ml free water daily.  RD will be operating remotely to tomorrow. Will recheck propofol rate, make adjustments if needed and place order.   NUTRITION DIAGNOSIS:  Malnutrition (Severe) related to lethargy/confusion, inability to eat as evidenced by an energy intake that has met < or equal to 50% of needs for > or equal to 5 days and an apparent loss of >2% bw x1 week  Ongoing, worsening  GOAL:  Patient will meet greater than or equal to 90% of their needs   Not met  MONITOR:  Supplement acceptance, PO intake, Diet advancement, Labs, Weight trends  ASSESSMENT:  51 y/o male PMHx TBI, Chronic Pain, seizure disorder, COPD. Initially presented to ED after experiencing significant seizures. Was briefly intubated in the ED for respiratory failure, self extubated shortly after. Has been maintained on Bipap and sedation majority of admission for AMS/Combativeness.   Interval Hx 5/9. First BM since admit. Diet advanced to Soft-no intake. Agitation Continues 5/10: Preceded eventually able to be turned off late that night. Experienced V tach over night 5/11: Cardiology consult. Code Blue. Intubated. Placed on Propofol. Potentially had seizure and aspirated again.   Pt coded and intubated this morning. Per MD note, pt planned to be placed on TF tomorrow  Per nursing, the only intake the patient had while on an oral diet was 120cc of OJ which would be the only intake pt has had since admit.   Has ~200 cc output from OGT at this time.   Abdomen is soft, non distended.   Patient is currently intubated on ventilator support MV: 10.9 L/min Temp (24hrs), Avg:99.3 F (37.4 C), Min:98.2 F (36.8 C), Max:100.2 F (37.9  C)  Propofol: 11.2 ml/hr =296 kacls/day  He was admitted at weight of 242 lbs, however likely reported-bed weight 5/1 was 226 lbs. Subsequent measurements have varied from 205-218 lbs. Bed weight today is 200 lbs 2.8 oz. Pt continues to lose weight  Physical Exam: Mild orbital fat wasting. Mild wasting of clavicular, quadriceps and clavicular musculature.   Meds: Folate,  THiamine, precedex stopped. Propofol on, IVF,  Pepcid infusion, IV abx, Keppra, KCL, Depacon, Ativan Labs: Now slightly febrile. TG 133, WBC 12.9 (incr), AlbuminL 3.1, Glu 155.    Recent Labs Lab 06/21/16 0427 06/21/16 2348 06/22/16 0151 06/22/16 1125  NA 142  --  142 141  K 2.9*  --  2.8* 3.8  CL 103  --  105 102  CO2 29  --  28 29  BUN 6  --  5* 5*  CREATININE 0.63  --  0.60* 0.59*  CALCIUM 8.7*  --  8.4* 8.2*  MG  --  2.4  --   --   GLUCOSE 112*  --  101* 155*   Diet Order:  Diet NPO time specified  Skin:  Reviewed, no issues  Last BM:  5/11-diarrhea  Height:  Ht Readings from Last 1 Encounters:  06/22/16 6' 2"  (1.88 m)   Weight:  Wt Readings from Last 1 Encounters:  06/22/16 200 lb 2.8 oz (90.8 kg)   Wt Readings from Last 10 Encounters:  06/22/16 200 lb 2.8 oz (90.8 kg)  05/09/16 225 lb (102.1 kg)  12/15/15 252 lb (114.3 kg)  02/13/15 265 lb (120.2 kg)  05/26/11 240 lb (108.9 kg)  01/19/11 240 lb (108.9 kg)  10/17/10 240 lb (108.9 kg)   Ideal Body Weight:  86.36 kg  BMI:  Body mass index is 25.7 kg/m.  Estimated Nutritional Needs:  Kcal:  2225 kcals (PSU 2003b) Protein:  112-130g Pro (1.3-1.5 g/kg bw) Fluid:  >2.2 L fluid (74m/kcal)  EDUCATION NEEDS:  No education needs identified at this time  NBurtis JunesRD, LDN, [place]Nutrition Pager: [phone]/12/2016 2:58 PM
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
## 2016-06-22 NOTE — Progress Notes (Signed)
Called because of imminent intubation and mechanical ventilation. Dr. branch of cardiology was in the room with the patient and he seemed to have several episodes of twitching and probable seizure activity. He did not have grand mal seizure. He became progressively obtunded after that. He's been having a lot of secretions. He lost his gag reflex. He did not have cardiac or respiratory arrest but it was clear that he was not going to be able to protect his airway. He was intubated on admission and was being treated for alcohol withdrawal but self extubated. He has aspirated and has aspiration pneumonia and may have aspirated again per nursing staff. He was intubated by emergency department physician and started on mechanical ventilation. Because of his previous problems with getting very agitated and self extubating I'm going to use propofol for sedation. Ventilator orders written. I've added other laboratory tests. Will check Keppra and Depakote levels. I have called Dr. Gerilyn Pilgrimoonquah but have not heard back quite yet  I personally reviewed his chest x-ray and it shows endotracheal tube about 6 cm above the carina. Blood tests are pending. I don't think we'll get the Keppra and Depakote levels back for at least 24-48 hours. Once he stabilizes probably tomorrow I'll start tube feedings since he's not been able to eat. I had called his son earlier this morning to update him on events overnight and I called him again now but got his voicemail. I did leave a message on the voicemail.   Discussed with Arline Aspindy RN his nurse. She has been able to reach the son. Discussed with Dr. Gerilyn Pilgrim[place]ah. He requested increase in Keppra dose and reimage his brain. He may need lumbar puncture.  Total ICU time 1 hour

## 2016-06-22 NOTE — Progress Notes (Signed)
eLink Physician-Brief Progress Note Patient Name: [name] DOB: 10-17-1965 MRN: [id number]   Date of Service  06/22/2016  HPI/Events of Note   Recent Labs Lab 06/19/16 0446  06/20/16 0911 06/21/16 0427 06/21/16 2348 06/22/16 0151  NA  --   --  141 142  --  142  K  --   < > 2.8* 2.9*  --  2.8*  CL  --   --  105 103  --  105  CO2  --   --  27 29  --  28  GLUCOSE  --   --  110* 112*  --  101*  BUN  --   --  6 6  --  5*  CREATININE 0.61  --  0.66 0.63  --  0.60*  CALCIUM  --   --  8.2* 8.7*  --  8.4*  MG  --   --   --   --  2.4  --   < > = values in this interval not displayed.   eICU Interventions  KCl ordered     Intervention Category Intermediate Interventions: Electrolyte abnormality - evaluation and management  BYRUM,ROBERT S. 06/22/2016, 2:51 AM

## 2016-06-22 NOTE — Progress Notes (Signed)
Pharmacy Antibiotic Note  [name] is a 51 y.o. male admitted on 06/12/2016 with aspiration pneumonia.  Pharmacy has been consulted for ZOSYN dosing. D#9 of zosyn. Duration of therapy per MD  Plan:  Continue Zosyn 3.375gm IV q8h, EID Monitor labs, renal fxn, progress and c/s Deescalate ABX when improved / appropriate.    Antimicrobials this admission: Zosyn 5/2 >>   Height: 6\' 2"  (188 cm) Weight: 206 lb 9.1 oz (93.7 kg) IBW/kg (Calculated) : 82.2  Temp (24hrs), Avg:98.9 F (37.2 C), Min:97.2 F (36.2 C), Max:99.9 F (37.7 C)   Recent Labs Lab 06/19/16 0446 06/20/16 0911 06/21/16 0427 06/22/16 0151  WBC  --  8.4  --   --   CREATININE 0.61 0.66 0.63 0.60*    Estimated Creatinine Clearance: 128.4 mL/min (A) (by C-G formula based on SCr of 0.6 mg/dL (L)).    Allergies  Allergen Reactions  . Oxycodone-Acetaminophen     Severe rash and itching  . Oxycodone Hcl     itching  . Percocet [Oxycodone-Acetaminophen] Other (See Comments)    itching   Microbiology results:  5/1MRSA PCR: negative  Thank you for allowing pharmacy to be a part of this patient's care.  Talbert CageLora Hasaan Radde, PharmD Clinical Pharmacist  06/22/2016 8:58 AM

## 2016-06-22 NOTE — Progress Notes (Signed)
*  PRELIMINARY RESULTS* Echocardiogram 2D Echocardiogram has been performed.  [name], [name] 06/22/2016, 2:40 PM

## 2016-06-22 NOTE — Progress Notes (Signed)
Subjective: He had significant events last night it seemed to be related to hypokalemia. He had ventricular tachycardia. He is off Precedex now. Heart rate is still up. Oxygenation is only fair. Potassium is being replaced again  Objective: Vital signs in last 24 hours: Temp:  [97.2 F (36.2 C)-99.9 F (37.7 C)] 99 F (37.2 C) (05/11 0700) Pulse Rate:  [73-131] 130 (05/11 0700) Resp:  [13-37] 16 (05/11 0700) BP: (101-203)/(62-158) 166/102 (05/11 0700) SpO2:  [91 %-100 %] 99 % (05/11 0700) Weight:  [93.7 kg (206 lb 9.1 oz)] 93.7 kg (206 lb 9.1 oz) (05/11 0500) Weight change: 0.7 kg (1 lb 8.7 oz) Last BM Date: 06/21/16  Intake/Output from previous day: 05/10 0701 - 05/11 0700 In: 2332 [P.O.:120; I.V.:1452; IV Piggyback:760] Out: 2950 [Urine:2950]  PHYSICAL EXAM General appearance: Still poorly responsive Resp: clear to auscultation bilaterally Cardio: Heart is regular at a rate of about 1:30 GI: soft, non-tender; bowel sounds normal; no masses,  no organomegaly Extremities: extremities normal, atraumatic, no cyanosis or edema Skin warm and dry  Lab Results:  Results for orders placed or performed during the hospital encounter of 06/12/16 (from the past 48 hour(s))  Comprehensive metabolic panel     Status: Abnormal   Collection Time: 06/20/16  9:11 AM  Result Value Ref Range   Sodium 141 135 - 145 mmol/L   Potassium 2.8 (L) 3.5 - 5.1 mmol/L   Chloride 105 101 - 111 mmol/L   CO2 27 22 - 32 mmol/L   Glucose, Bld 110 (H) 65 - 99 mg/dL   BUN 6 6 - 20 mg/dL   Creatinine, Ser 0.66 0.61 - 1.24 mg/dL   Calcium 8.2 (L) 8.9 - 10.3 mg/dL   Total Protein 7.0 6.5 - 8.1 g/dL   Albumin 2.9 (L) 3.5 - 5.0 g/dL   AST 21 15 - 41 U/L   ALT 15 (L) 17 - 63 U/L   Alkaline Phosphatase 53 38 - 126 U/L   Total Bilirubin 0.7 0.3 - 1.2 mg/dL   GFR calc non Af Amer >60 >60 mL/min   GFR calc Af Amer >60 >60 mL/min    Comment: (NOTE) The eGFR has been calculated using the CKD EPI equation. This  calculation has not been validated in all clinical situations. eGFR's persistently <60 mL/min signify possible Chronic Kidney Disease.    Anion gap 9 5 - 15  CBC with Differential/Platelet     Status: Abnormal   Collection Time: 06/20/16  9:11 AM  Result Value Ref Range   WBC 8.4 4.0 - 10.5 K/uL   RBC 4.62 4.22 - 5.81 MIL/uL   Hemoglobin 15.0 13.0 - 17.0 g/dL   HCT 41.4 39.0 - 52.0 %   MCV 89.6 78.0 - 100.0 fL   MCH 32.5 26.0 - 34.0 pg   MCHC 36.2 (H) 30.0 - 36.0 g/dL   RDW 11.8 11.5 - 15.5 %   Platelets 190 150 - 400 K/uL   Neutrophils Relative % 75 %   Neutro Abs 6.3 1.7 - 7.7 K/uL   Lymphocytes Relative 15 %   Lymphs Abs 1.3 0.7 - 4.0 K/uL   Monocytes Relative 9 %   Monocytes Absolute 0.8 0.1 - 1.0 K/uL   Eosinophils Relative 1 %   Eosinophils Absolute 0.1 0.0 - 0.7 K/uL   Basophils Relative 0 %   Basophils Absolute 0.0 0.0 - 0.1 K/uL  Glucose, capillary     Status: None   Collection Time: 06/20/16 11:28 AM  Result Value  Ref Range   Glucose-Capillary 97 65 - 99 mg/dL  Glucose, capillary     Status: Abnormal   Collection Time: 06/20/16  5:06 PM  Result Value Ref Range   Glucose-Capillary 105 (H) 65 - 99 mg/dL  Glucose, capillary     Status: None   Collection Time: 06/20/16  7:58 PM  Result Value Ref Range   Glucose-Capillary 98 65 - 99 mg/dL  Glucose, capillary     Status: Abnormal   Collection Time: 06/20/16 11:20 PM  Result Value Ref Range   Glucose-Capillary 113 (H) 65 - 99 mg/dL  Glucose, capillary     Status: Abnormal   Collection Time: 06/21/16  3:08 AM  Result Value Ref Range   Glucose-Capillary 112 (H) 65 - 99 mg/dL  Basic metabolic panel     Status: Abnormal   Collection Time: 06/21/16  4:27 AM  Result Value Ref Range   Sodium 142 135 - 145 mmol/L   Potassium 2.9 (L) 3.5 - 5.1 mmol/L   Chloride 103 101 - 111 mmol/L   CO2 29 22 - 32 mmol/L   Glucose, Bld 112 (H) 65 - 99 mg/dL   BUN 6 6 - 20 mg/dL   Creatinine, Ser 0.63 0.61 - 1.24 mg/dL   Calcium 8.7  (L) 8.9 - 10.3 mg/dL   GFR calc non Af Amer >60 >60 mL/min   GFR calc Af Amer >60 >60 mL/min    Comment: (NOTE) The eGFR has been calculated using the CKD EPI equation. This calculation has not been validated in all clinical situations. eGFR's persistently <60 mL/min signify possible Chronic Kidney Disease.    Anion gap 10 5 - 15  Glucose, capillary     Status: Abnormal   Collection Time: 06/21/16  7:59 AM  Result Value Ref Range   Glucose-Capillary 120 (H) 65 - 99 mg/dL  Glucose, capillary     Status: Abnormal   Collection Time: 06/21/16 12:57 PM  Result Value Ref Range   Glucose-Capillary 106 (H) 65 - 99 mg/dL  Glucose, capillary     Status: Abnormal   Collection Time: 06/21/16  5:03 PM  Result Value Ref Range   Glucose-Capillary 109 (H) 65 - 99 mg/dL  Valproic acid level     Status: None   Collection Time: 06/21/16  7:42 PM  Result Value Ref Range   Valproic Acid Lvl 61 50.0 - 100.0 ug/mL  Ammonia     Status: Abnormal   Collection Time: 06/21/16  7:42 PM  Result Value Ref Range   Ammonia 46 (H) 9 - 35 umol/L  Glucose, capillary     Status: None   Collection Time: 06/21/16  8:03 PM  Result Value Ref Range   Glucose-Capillary 93 65 - 99 mg/dL  Glucose, capillary     Status: Abnormal   Collection Time: 06/21/16 11:43 PM  Result Value Ref Range   Glucose-Capillary 101 (H) 65 - 99 mg/dL  Magnesium     Status: None   Collection Time: 06/21/16 11:48 PM  Result Value Ref Range   Magnesium 2.4 1.7 - 2.4 mg/dL  Troponin I     Status: Abnormal   Collection Time: 06/21/16 11:48 PM  Result Value Ref Range   Troponin I 0.06 (HH) <0.03 ng/mL    Comment: CRITICAL RESULT CALLED TO, READ BACK BY AND VERIFIED WITH: ROBERTS, T. AT 0030 ON 06/22/2016 BY EVA   Basic metabolic panel     Status: Abnormal   Collection Time: 06/22/16  1:51 AM  Result Value Ref Range   Sodium 142 135 - 145 mmol/L   Potassium 2.8 (L) 3.5 - 5.1 mmol/L   Chloride 105 101 - 111 mmol/L   CO2 28 22 - 32  mmol/L   Glucose, Bld 101 (H) 65 - 99 mg/dL   BUN 5 (L) 6 - 20 mg/dL   Creatinine, Ser 0.60 (L) 0.61 - 1.24 mg/dL   Calcium 8.4 (L) 8.9 - 10.3 mg/dL   GFR calc non Af Amer >60 >60 mL/min   GFR calc Af Amer >60 >60 mL/min    Comment: (NOTE) The eGFR has been calculated using the CKD EPI equation. This calculation has not been validated in all clinical situations. eGFR's persistently <60 mL/min signify possible Chronic Kidney Disease.    Anion gap 9 5 - 15  Glucose, capillary     Status: None   Collection Time: 06/22/16  4:20 AM  Result Value Ref Range   Glucose-Capillary 99 65 - 99 mg/dL  Troponin I     Status: Abnormal   Collection Time: 06/22/16  5:01 AM  Result Value Ref Range   Troponin I 0.07 (HH) <0.03 ng/mL    Comment: CRITICAL RESULT CALLED TO, READ BACK BY AND VERIFIED WITH: DANIELS,J AT 6:15AM ON 06/22/16 BY FESTERMAN,C     ABGS No results for input(s): PHART, PO2ART, TCO2, HCO3 in the last 72 hours.  Invalid input(s): PCO2 CULTURES Recent Results (from the past 240 hour(s))  MRSA PCR Screening     Status: None   Collection Time: 06/12/16  5:55 PM  Result Value Ref Range Status   MRSA by PCR NEGATIVE NEGATIVE Final    Comment:        The GeneXpert MRSA Assay (FDA approved for NASAL specimens only), is one component of a comprehensive MRSA colonization surveillance program. It is not intended to diagnose MRSA infection nor to guide or monitor treatment for MRSA infections.    Studies/Results: No results found.  Medications:  Prior to Admission:  Prescriptions Prior to Admission  Medication Sig Dispense Refill Last Dose  . albuterol (PROVENTIL HFA;VENTOLIN HFA) 108 (90 BASE) MCG/ACT inhaler Inhale 2 puffs into the lungs every 6 (six) hours as needed. For shortness of breath   unknown  . ALPRAZolam (XANAX) 1 MG tablet Take 1 mg by mouth 3 (three) times daily as needed for anxiety.   06/12/2016 at Unknown time  . oxyCODONE-acetaminophen (PERCOCET) 10-325  MG tablet Take 1 tablet by mouth 4 (four) times daily as needed for pain.   Past Week at Unknown time   Scheduled: . chlorhexidine  15 mL Mouth/Throat BID  . enoxaparin (LOVENOX) injection  40 mg Subcutaneous Q24H  . folic acid  1 mg Intravenous Daily  . insulin aspart  0-9 Units Subcutaneous Q4H  . mouth rinse  15 mL Mouth Rinse QID  . sodium chloride flush  3 mL Intravenous Q12H  . thiamine  100 mg Intravenous Daily   Continuous: . sodium chloride 110 mL/hr at 06/22/16 0700  . dexmedetomidine (PRECEDEX) IV infusion Stopped (06/22/16 0115)  . famotidine (PEPCID) IV Stopped (06/21/16 2139)  . levETIRAcetam Stopped (06/21/16 2124)  . piperacillin-tazobactam (ZOSYN)  IV Stopped (06/22/16 0531)  . potassium chloride Stopped (06/22/16 0659)  . valproate sodium Stopped (06/22/16 3500)   XFG:[id number] **OR** acetaminophen, bisacodyl, docusate, fentaNYL (SUBLIMAZE) injection, fentaNYL (SUBLIMAZE) injection, hydrALAZINE, ipratropium-albuterol, LORazepam, LORazepam, ondansetron **OR** ondansetron (ZOFRAN) IV  Assesment: He was admitted with seizures. He also had acute alcohol intoxication. He was hypokalemic and  had IV potassium replacement and his potassium was low again yesterday and I ordered IV potassium replacement again but I don't see the order and it was not given. I'm not sure what happened. His potassium was low last night and is being replaced again now. He has continued severe metabolic encephalopathy which is multifactorial. He has some COPD at baseline. He has protein calorie malnutrition severe. Principal Problem:   Seizures ([place]) Active Problems:   Alcohol abuse with intoxication ([place])   COPD (chronic obstructive pulmonary disease) ([place])   History of traumatic brain injury   Endotracheally intubated   Respiratory failure (HCC)   Aspiration pneumonia (HCC)   Metabolic encephalopathy   Protein-calorie malnutrition, severe    Plan: Continue potassium replacement. Request  cardiology consultation. I still have concern about trying to give him any sort of tube feedings because he has been pulling out IVs and I think he will pull out any sort of feeding tube    LOS: 10 days   Beryl Balz L 06/22/2016, 7:49 AM

## 2016-06-22 NOTE — Consult Note (Signed)
CARDIOLOGY CONSULT NOTE   Patient ID: [name] MRN: [id number] DOB/AGE: 1965-09-18 50 y.o.  Admit Date: 06/12/2016 Referring Physician: Kari Baars MD Primary Physician: Kari Baars, MD Consulting Cardiologist: Dina Rich MD Primary Cardiologist: New Reason for Consultation: V-Tach in the setting of hypokalemic   Clinical Summary [name] is a 51 y.o. male who is being seen today for the evaluation of V-tach at the request of Dr. Juanetta Gosling. The patient has a history of chronic pain, traumatic brain injury, seizure disorder, and COPD. Came to ER with multiple seizures. He had recently been incarcerated, and was doing well at home until he ran out of his seizure medications. While drinking alcohol with friends he was reported to have had two seizures. EMS gave him Ativan in the field. He was also found to have alcohol intoxication.   On arrival to ER, BP 117/79, HR 94, afebrile. CBC unremarkable, as well as chemistries. On 06/22/2016 he was found to have episode of VT and reported to [facility] He was found to be hypokalemic with potassium of 2.9. He was treated with magnesium and potassium replacement.  '  The patient is sedated,history is obtained from current and previous medical records.    Allergies  Allergen Reactions  . Oxycodone-Acetaminophen     Severe rash and itching  . Oxycodone Hcl     itching  . Percocet [Oxycodone-Acetaminophen] Other (See Comments)    itching    Medications Scheduled Medications: . chlorhexidine  15 mL Mouth/Throat BID  . enoxaparin (LOVENOX) injection  40 mg Subcutaneous Q24H  . folic acid  1 mg Intravenous Daily  . insulin aspart  0-9 Units Subcutaneous Q4H  . mouth rinse  15 mL Mouth Rinse QID  . sodium chloride flush  3 mL Intravenous Q12H  . thiamine  100 mg Intravenous Daily     Infusions: . sodium chloride 110 mL/hr at 06/22/16 0831  . famotidine (PEPCID) IV 20 mg (06/22/16 0923)  . levETIRAcetam 500 mg  (06/22/16 0934)  . piperacillin-tazobactam (ZOSYN)  IV 3.375 g (06/22/16 0923)  . valproate sodium Stopped (06/22/16 0655)     PRN Medications:  acetaminophen **OR** acetaminophen, bisacodyl, docusate, fentaNYL (SUBLIMAZE) injection, fentaNYL (SUBLIMAZE) injection, hydrALAZINE, ipratropium-albuterol, LORazepam, LORazepam, ondansetron **OR** ondansetron (ZOFRAN) IV   Past Medical History:  Diagnosis Date  . Arthritis   . Asthma   . Chronic neck pain   . COPD (chronic obstructive pulmonary disease) (HCC)   . Injury of jawline   . Mental disorder   . Ruptured disc, cervical   . Seizures (HCC)   . Torn rotator cuff     History reviewed. No pertinent surgical history.  Family History  Problem Relation Age of Onset  . Heart failure Father   . Cancer Father   . Alcoholism Brother      Social History [name] reports that he has been smoking Cigarettes and Cigars.  He has never used smokeless tobacco. [name] reports that he does not drink alcohol.  Review of Systems Complete review of systems are found to be negative unless outlined in H&P above.  Physical Examination Blood pressure (!) 166/102, pulse (!) 130, temperature 99 F (37.2 C), resp. rate 16, height 6\' 2"  (1.88 m), weight 206 lb 9.1 oz (93.7 kg), SpO2 99 %.  Intake/Output Summary (Last 24 hours) at 06/22/16 1014 Last data filed at 06/22/16 0756  Gross per 24 hour  Intake  1810 ml  Output             2950 ml  Net            -1140 ml    Telemetry: 3:30 am, V-tach > 200 bpm, with two other episodes of VT short salvo's. Now SR with PVC's.  GEN: Sedated, unresponsive HEENT: Conjunctiva and lids normal, oropharynx clear with moist mucosa. Neck: Supple, no elevated JVP or carotid bruits, no thyromegaly. Lungs: Bilateral rales and rhonchi. Periods of apnea, wearing NRB mask.  Cardiac: Regular rate and rhythm, no S3 or significant systolic murmur, no pericardial rub. Abdomen: Soft, nontender, no  hepatomegaly, bowel sounds present, no guarding or rebound. Extremities: No pitting edema, distal pulses 2+. Skin: Warm and dry. Musculoskeletal: No kyphosis. Neuropsychiatric: Grimacing and grunting, no verbal response.   Prior Cardiac Testing/Procedures  Lab Results  Basic Metabolic Panel:  Recent Labs Lab 06/19/16 0446  06/20/16 0911 06/21/16 0427 06/21/16 2348 06/22/16 0151  NA  --   --  141 142  --  142  K  --   < > 2.8* 2.9*  --  2.8*  CL  --   --  105 103  --  105  CO2  --   --  27 29  --  28  GLUCOSE  --   --  110* 112*  --  101*  BUN  --   --  6 6  --  5*  CREATININE 0.61  --  0.66 0.63  --  0.60*  CALCIUM  --   --  8.2* 8.7*  --  8.4*  MG  --   --   --   --  2.4  --   < > = values in this interval not displayed.  Liver Function Tests:  Recent Labs Lab 06/20/16 0911  AST 21  ALT 15*  ALKPHOS 53  BILITOT 0.7  PROT 7.0  ALBUMIN 2.9*    CBC:  Recent Labs Lab 06/20/16 0911  WBC 8.4  NEUTROABS 6.3  HGB 15.0  HCT 41.4  MCV 89.6  PLT 190    Cardiac Enzymes:  Recent Labs Lab 06/21/16 2348 06/22/16 0501  TROPONINI 0.06* 0.07*    Radiology: No results found.   ECG: NSR rate of 94 bpm.    Impression and Recommendations  1. V-Tach: Noted overnight on telemetry in the setting of hypokalemia, potassium of 2.8  He has a PICC line and this has been repleted, along with magnesium. Will have echo completed for evaluation of structural heart disease. He is not on BB currently, with NSR found on telemetry with occasional PVC's.    2. Hypertension: Blood pressure is elevated. He is receiving hydralazine prn for BP control. He is non-cooperative and sedated at this time. Was not on antihypertensives prior to admission. Can consider po medications when he is able to swallow safely.  3. COPD with Questionable Aspiration: Lungs extremely congested. On abx. Will repeat CXR.   3. Questionable ETOH abuse: On DT precautions.   4. Apnea: Noted on  assessment. He may need to be on BiPAP vs  CPAP, will defer to Dr. Juanetta GoslingHawkins. Possible intubation. Frequent desaturations.   5. Seizures: On Keppra. Followed by Dr. Gerilyn [place].    Signed: Bettey MareKathryn M. Liborio NixonLawrence DNP, ANP, AACC  06/22/2016, 10:14 AM Co-Sign MD  Patient seen and discussed with DNP Lyman BishopLawrence, I agree with her documentation above. 51 yo male history of traumatic brain injury, chronic pain, seizure disorder, COPD, EtOH abuse, admitted  06/12/16 with seizures. Patient was intubated during this admission   Cardiology consulted for episode of VT last night in setting of hypokalemia and IV haldol and prededex use. K was 2.9, Mg 2.4. EKG after episode sinus tach 124, manual QTc at 500. K is being repleted, haldol and precedex stopped. His EKG after rhythm is nonischemic. Mild flat troponin not consistent with ACS, likely related to tachycardia and episodes of severe HTN likely related to EtOH withdrawal. From cardiac standpoint do not suspect primary cardiac process. Suspect electrolyte, medication, and catecholamine imbalances related to [facility] withdrawal as cause. We will obtain echo. If recurrent episodes can IV lopressor vs amiodarone drip.   Upon evaluating the patient he is nonresponsive to sternal rub, hypoxic on nonrebreather to mid 80s. He has no gag reflex, agonal breathing. A code was called to expedite intubation. Nursing reports he was posturing prior to this decompensation, perhaps had repeat seizure and not protecting his airway vs aspiration. Patient is being reintubated.    Dina Rich MD

## 2016-06-23 ENCOUNTER — Inpatient Hospital Stay (HOSPITAL_COMMUNITY): Payer: Medicaid Other

## 2016-06-23 DIAGNOSIS — J690 Pneumonitis due to inhalation of food and vomit: Secondary | ICD-10-CM

## 2016-06-23 LAB — BASIC METABOLIC PANEL
ANION GAP: 5 (ref 5–15)
BUN: 10 mg/dL (ref 6–20)
CO2: 29 mmol/L (ref 22–32)
Calcium: 8.2 mg/dL — ABNORMAL LOW (ref 8.9–10.3)
Chloride: 107 mmol/L (ref 101–111)
Creatinine, Ser: 0.84 mg/dL (ref 0.61–1.24)
GLUCOSE: 119 mg/dL — AB (ref 65–99)
POTASSIUM: 2.8 mmol/L — AB (ref 3.5–5.1)
Sodium: 141 mmol/L (ref 135–145)

## 2016-06-23 LAB — URINALYSIS, ROUTINE W REFLEX MICROSCOPIC
BACTERIA UA: NONE SEEN
Bilirubin Urine: NEGATIVE
Glucose, UA: NEGATIVE mg/dL
HGB URINE DIPSTICK: NEGATIVE
Ketones, ur: 5 mg/dL — AB
LEUKOCYTES UA: NEGATIVE
NITRITE: NEGATIVE
Protein, ur: 30 mg/dL — AB
SQUAMOUS EPITHELIAL / LPF: NONE SEEN
Specific Gravity, Urine: 1.026 (ref 1.005–1.030)
WBC UA: NONE SEEN WBC/hpf (ref 0–5)
pH: 5 (ref 5.0–8.0)

## 2016-06-23 LAB — COMPREHENSIVE METABOLIC PANEL
ALBUMIN: 3 g/dL — AB (ref 3.5–5.0)
ALT: 15 U/L — ABNORMAL LOW (ref 17–63)
ANION GAP: 11 (ref 5–15)
AST: 25 U/L (ref 15–41)
Alkaline Phosphatase: 48 U/L (ref 38–126)
BILIRUBIN TOTAL: 1.1 mg/dL (ref 0.3–1.2)
BUN: 11 mg/dL (ref 6–20)
CHLORIDE: 103 mmol/L (ref 101–111)
CO2: 27 mmol/L (ref 22–32)
Calcium: 8.7 mg/dL — ABNORMAL LOW (ref 8.9–10.3)
Creatinine, Ser: 0.79 mg/dL (ref 0.61–1.24)
GFR calc Af Amer: >60 mL/min (ref >60)
GFR calc non Af Amer: >60 mL/min (ref >60)
GLUCOSE: 112 mg/dL — AB (ref 65–99)
POTASSIUM: 3.1 mmol/L — AB (ref 3.5–5.1)
SODIUM: 141 mmol/L (ref 135–145)
TOTAL PROTEIN: 7.3 g/dL (ref 6.5–8.1)

## 2016-06-23 LAB — GLUCOSE, CAPILLARY
GLUCOSE-CAPILLARY: 105 mg/dL — AB (ref 65–99)
GLUCOSE-CAPILLARY: 101 mg/dL — AB (ref 65–99)
Glucose-Capillary: 92 mg/dL (ref 65–99)
Glucose-Capillary: 84 mg/dL (ref 65–99)
Glucose-Capillary: 96 mg/dL (ref 65–99)
Glucose-Capillary: 103 mg/dL — ABNORMAL HIGH (ref 65–99)

## 2016-06-23 LAB — BLOOD GAS, ARTERIAL
Acid-Base Excess: 2.8 mmol/L — ABNORMAL HIGH (ref 0.0–2.0)
BICARBONATE: 27.2 mmol/L (ref 20.0–28.0)
Drawn by: 105551
FIO2: 40
LHR: 16 {breaths}/min
MECHVT: 660 mL
O2 Saturation: 97.6 %
PEEP: 5 cmH2O
PO2 ART: 112 mmHg — AB (ref 83.0–108.0)
Patient temperature: 38.9
pCO2 arterial: 39.4 mmHg (ref 32.0–48.0)
pH, Arterial: 7.447 (ref 7.350–7.450)

## 2016-06-23 LAB — LACTIC ACID, PLASMA
LACTIC ACID, VENOUS: 1 mmol/L (ref 0.5–1.9)
Lactic Acid, Venous: 0.9 mmol/L (ref 0.5–1.9)

## 2016-06-23 LAB — CBC WITH DIFFERENTIAL/PLATELET
BASOS ABS: 0 10*3/uL (ref 0.0–0.1)
BASOS PCT: 0 %
EOS ABS: 0 10*3/uL (ref 0.0–0.7)
EOS PCT: 0 %
HCT: 41.8 % (ref 39.0–52.0)
Hemoglobin: 14.7 g/dL (ref 13.0–17.0)
Lymphocytes Relative: 6 %
Lymphs Abs: 1.5 10*3/uL (ref 0.7–4.0)
MCH: 33 pg (ref 26.0–34.0)
MCHC: 35.2 g/dL (ref 30.0–36.0)
MCV: 93.7 fL (ref 78.0–100.0)
MONO ABS: 2.1 10*3/uL — AB (ref 0.1–1.0)
Monocytes Relative: 9 %
NEUTROS ABS: 19.4 10*3/uL — AB (ref 1.7–7.7)
Neutrophils Relative %: 85 %
PLATELETS: 234 10*3/uL (ref 150–400)
RBC: 4.46 MIL/uL (ref 4.22–5.81)
RDW: 12.5 % (ref 11.5–15.5)
WBC: 23 10*3/uL — ABNORMAL HIGH (ref 4.0–10.5)

## 2016-06-23 LAB — CBC
HEMATOCRIT: 39.4 % (ref 39.0–52.0)
Hemoglobin: 13.8 g/dL (ref 13.0–17.0)
MCH: 32.5 pg (ref 26.0–34.0)
MCHC: 35 g/dL (ref 30.0–36.0)
MCV: 92.9 fL (ref 78.0–100.0)
Platelets: 238 10*3/uL (ref 150–400)
RBC: 4.24 MIL/uL (ref 4.22–5.81)
RDW: 12.6 % (ref 11.5–15.5)
WBC: 19.1 10*3/uL — ABNORMAL HIGH (ref 4.0–10.5)

## 2016-06-23 LAB — MAGNESIUM: Magnesium: 2.1 mg/dL (ref 1.7–2.4)

## 2016-06-23 LAB — PROCALCITONIN: Procalcitonin: 0.11 ng/mL

## 2016-06-23 MED ORDER — ALBUTEROL SULFATE (2.5 MG/3ML) 0.083% IN NEBU
2.5000 mg | INHALATION_SOLUTION | RESPIRATORY_TRACT | Status: DC | PRN
  Administered 2016-06-26: 2.5 mg via RESPIRATORY_TRACT
  Filled 2016-06-23: qty 3

## 2016-06-23 MED ORDER — PANTOPRAZOLE SODIUM 40 MG PO PACK
40.0000 mg | PACK | Freq: Every day | ORAL | Status: DC
  Administered 2016-06-23 – 2016-06-28 (×6): 40 mg
  Filled 2016-06-23 (×6): qty 20

## 2016-06-23 MED ORDER — VITAL HIGH PROTEIN PO LIQD: 1000.0000 mL | ORAL | Status: DC

## 2016-06-23 MED ORDER — FOLIC ACID 5 MG/ML IJ SOLN
1.0000 mg | Freq: Every day | INTRAMUSCULAR | Status: DC
  Administered 2016-06-23 – 2016-06-24 (×2): 1 mg via INTRAVENOUS
  Filled 2016-06-23 (×3): qty 0.2

## 2016-06-23 MED ORDER — CHLORHEXIDINE GLUCONATE CLOTH 2 % EX PADS
6.0000 | MEDICATED_PAD | Freq: Every day | CUTANEOUS | Status: DC
  Administered 2016-06-25 – 2016-06-30 (×7): 6 via TOPICAL

## 2016-06-23 MED ORDER — LORAZEPAM 2 MG/ML IJ SOLN: 1.0000 mg | INTRAMUSCULAR | Status: DC | PRN

## 2016-06-23 MED ORDER — ACETAMINOPHEN 160 MG/5ML PO SOLN
650.0000 mg | Freq: Four times a day (QID) | ORAL | Status: DC | PRN
  Administered 2016-06-25 (×2): 650 mg
  Filled 2016-06-23 (×2): qty 20.3

## 2016-06-23 MED ORDER — ADULT MULTIVITAMIN LIQUID CH
15.0000 mL | Freq: Every day | ORAL | Status: DC
  Administered 2016-06-23 – 2016-06-29 (×7): 15 mL
  Filled 2016-06-23 (×7): qty 15

## 2016-06-23 MED ORDER — VITAL AF 1.2 CAL PO LIQD
1000.0000 mL | ORAL | Status: DC
  Administered 2016-06-23 – 2016-06-26 (×5): 1000 mL

## 2016-06-23 MED ORDER — POTASSIUM CHLORIDE 10 MEQ/100ML IV SOLN
10.0000 meq | INTRAVENOUS | Status: AC
  Administered 2016-06-23 (×6): 10 meq via INTRAVENOUS
  Filled 2016-06-23 (×5): qty 100

## 2016-06-23 MED ORDER — PANTOPRAZOLE SODIUM 40 MG IV SOLR
40.0000 mg | Freq: Two times a day (BID) | INTRAVENOUS | Status: DC
  Administered 2016-06-23: 40 mg via INTRAVENOUS
  Filled 2016-06-23: qty 40

## 2016-06-23 MED ORDER — VANCOMYCIN HCL IN DEXTROSE 1-5 GM/200ML-% IV SOLN
1000.0000 mg | Freq: Once | INTRAVENOUS | Status: AC
  Administered 2016-06-23: 1000 mg via INTRAVENOUS
  Filled 2016-06-23: qty 200

## 2016-06-23 MED ORDER — FENTANYL CITRATE (PF) 100 MCG/2ML IJ SOLN
25.0000 ug | INTRAMUSCULAR | Status: DC | PRN
  Administered 2016-06-23 – 2016-06-24 (×3): 50 ug via INTRAVENOUS
  Administered 2016-06-25 (×6): 100 ug via INTRAVENOUS
  Administered 2016-06-25: 50 ug via INTRAVENOUS
  Administered 2016-06-26 (×5): 100 ug via INTRAVENOUS
  Filled 2016-06-23 (×15): qty 2

## 2016-06-23 MED ORDER — VANCOMYCIN HCL IN DEXTROSE 1-5 GM/200ML-% IV SOLN
1000.0000 mg | Freq: Three times a day (TID) | INTRAVENOUS | Status: DC
  Administered 2016-06-23 – 2016-06-27 (×13): 1000 mg via INTRAVENOUS
  Filled 2016-06-23 (×14): qty 200

## 2016-06-23 MED ORDER — IPRATROPIUM-ALBUTEROL 0.5-2.5 (3) MG/3ML IN SOLN
3.0000 mL | Freq: Three times a day (TID) | RESPIRATORY_TRACT | Status: DC
  Administered 2016-06-24 – 2016-06-28 (×13): 3 mL via RESPIRATORY_TRACT
  Filled 2016-06-23 (×13): qty 3

## 2016-06-23 MED ORDER — LACTATED RINGERS IV BOLUS (SEPSIS)
1000.0000 mL | Freq: Once | INTRAVENOUS | Status: AC
  Administered 2016-06-23: 1000 mL via INTRAVENOUS

## 2016-06-23 MED ORDER — SODIUM CHLORIDE 0.9 % IV BOLUS (SEPSIS)
1000.0000 mL | Freq: Once | INTRAVENOUS | Status: AC
Start: 2016-06-23 — End: 2016-06-23
  Administered 2016-06-23: 1000 mL via INTRAVENOUS

## 2016-06-23 NOTE — Progress Notes (Signed)
EEG completed, results pending. 

## 2016-06-23 NOTE — H&P (Signed)
[place] Pulmonary & Critical Care Attending Consult  Physician Requesting Consult:  Sinda Du, M.D. / [facility]  Date of Consult:  06/23/2016  Reason for Consult/Chief Complaint:  Septic Shock with Seizure  History of Presenting Illness:  History obtained from his outside [place] medical record given his intubated status. 51 y.o. male with known history of traumatic brain injury and seizure disorder as well as underlying COPD. Presented to the emergency department after recurrent seizures. Patient recently was released from jail and had been out of his seizure medications including Topamax and Depakote. While drinking alcohol with friends earlier in the day he was noted to have 2 separate generalized seizure like episodes and EMS was called. Upon arrival they witnessed a third generalized seizure and 2 mg of Ativan was administered. Patient was subsequently transported to the hospital by EMS. Upon further investigation the patient had a seizure approximately 3 weeks prior to presentation as well. Patient was afebrile on presentation saturating normally on room air. In the emergency department he had another seizure with a brief postictal period. He was given his usual dose of oral Topamax. While in the emergency department he had repeated episodes of seizures and was unable to protect his airway thus was intubated by the emergency department physician. Patient was extubated the same day while nurse was performing oral care. He was subsequently transitioned to BiPAP. Patient was evaluated by neurology on 5/2 and had no epileptiform activity on EEG. Patient was placed on maintenance Depakote and Keppra. Per documentation patient remained on BiPAP support and was significantly encephalopathic. Patient was treated with a Precedex infusion while admitted. His level of alertness improved and patient became less combative. On 5/10 the patient developed ventricular tachycardia with a prolonged QT while  on Haldol and with hypokalemia. Haldol was discontinued and patient was given magnesium sulfate. Patient was ultimately transitioned off Precedex with borderline oxygenation. Patient had further seizure activity on 5/11 with subsequent hypoxia requiring endotracheal reintubation on 5/11. He developed increasing secretions likely from aspiration pneumonia and with hypotension responsive to IV fluids was transferred to our facility for further critical care support and assessment by neurology.  Review of Systems: Unable to obtain as the patient is intubated and sedated.   Allergies  Allergen Reactions  . Oxycodone-Acetaminophen     Severe rash and itching  . Oxycodone Hcl     itching  . Percocet [Oxycodone-Acetaminophen] Other (See Comments)    itching    No current facility-administered medications on file prior to encounter.    Current Outpatient Prescriptions on File Prior to Encounter  Medication Sig Dispense Refill  . albuterol (PROVENTIL HFA;VENTOLIN HFA) 108 (90 BASE) MCG/ACT inhaler Inhale 2 puffs into the lungs every 6 (six) hours as needed. For shortness of breath    . ALPRAZolam (XANAX) 1 MG tablet Take 1 mg by mouth 3 (three) times daily as needed for anxiety.      Past Medical History:  Diagnosis Date  . Arthritis   . Asthma   . Chronic neck pain   . COPD (chronic obstructive pulmonary disease) ([place])   . Injury of jawline   . Mental disorder   . Ruptured disc, cervical   . Seizures ([place])   . Torn rotator cuff     History reviewed. No pertinent surgical history.  Family History  Problem Relation Age of Onset  . Heart failure Father   . Cancer Father   . Alcoholism Brother     Social History  Social History  . Marital status: Single    Spouse name: N/A  . Number of children: N/A  . Years of education: N/A   Social History Main Topics  . Smoking status: Current Some Day Smoker    Types: Cigarettes, Cigars  . Smokeless tobacco: Never Used  . Alcohol use  No     Comment: "monthy"  . Drug use: No  . Sexual activity: Not Asked   Other Topics Concern  . None   Social History Narrative  . None    Vent Mode: PRVC FiO2 (%):  [40 %-100 %] 40 % Set Rate:  [16 bmp] 16 bmp Vt Set:  [16 mL-660 mL] 650 mL PEEP:  [5 cmH20] 5 cmH20 Plateau Pressure:  [10 MAY04-59 cmH20] 13 cmH20  Temp:  [98.1 F (36.7 C)-103.5 F (39.7 C)] 98.2 F (36.8 C) (05/12 1000) Pulse Rate:  [84-135] 98 (05/12 1120) Resp:  [10-25] 16 (05/12 1120) BP: (70-166)/(47-106) 96/58 (05/12 1000) SpO2:  [94 %-100 %] 98 % (05/12 1205) FiO2 (%):  [40 %-100 %] 40 % (05/12 1205) Weight:  [200 lb 2.8 oz (90.8 kg)] 200 lb 2.8 oz (90.8 kg) (05/11 1450)  General:  No distress. No family at bedside. Endotracheally intubated.  Integument:  Warm & dry. No rash or bruising on exposed skin.  Extremities:  No cyanosis or clubbing.  Lymphatics:  No appreciated cervical or supraclavicular lymphadenoapthy. HEENT:  Moist mucus membranes. No scleral icterus. Endotracheal tube in place. Cardiovascular:  Regular rate & rhythm. No edema. No JVD appreciated.  Telemetry:  Normal sinus rhythm. Pulmonary:  Coarse breath sounds bilaterally. Symmetric chest wall rise on ventilator. Abdomen: Soft. Normoactive bowel sounds. Nondistended.  Musculoskeletal:  Normal bulk. No joint deformity or effusion appreciated. Neurological:  No meningismus or nuchal rigidity. Pupils symmetric and reactrive. Sedated with no spontaneous movements.  Psychiatric:  Unable to assess given intubated status.   LINES/TUBES: OETT 05/01 (self-extubated same day); 5/11 >>> RUE DL PICC 5/10 >>> Foley >>> OGT 5/11 >>> PIV  CBC Latest Ref Rng & Units 06/23/2016 06/23/2016 06/22/2016  WBC 4.0 - 10.5 K/uL 23.0(H) 19.1(H) 12.9(H)  Hemoglobin 13.0 - 17.0 g/dL 14.7 13.8 14.6  Hematocrit 39.0 - 52.0 % 41.8 39.4 42.2  Platelets 150 - 400 K/uL 234 238 210    BMP Latest Ref Rng & Units 06/23/2016 06/22/2016 06/22/2016  Glucose 65 -  99 mg/dL 112(H) 155(H) 101(H)  BUN 6 - 20 mg/dL 11 5(L) 5(L)  Creatinine 0.61 - 1.24 mg/dL 0.79 0.59(L) 0.60(L)  Sodium 135 - 145 mmol/L 141 141 142  Potassium 3.5 - 5.1 mmol/L 3.1(L) 3.8 2.8(L)  Chloride 101 - 111 mmol/L 103 102 105  CO2 22 - 32 mmol/L _0 Calcium 8.9 - 10.3 mg/dL 8.7(L) 8.2(L) 8.4(L)    Hepatic Function Latest Ref Rng & Units 06/23/2016 06/22/2016 06/20/2016  Total Protein 6.5 - 8.1 g/dL 7.3 7.4 7.0  Albumin 3.5 - 5.0 g/dL 3.0(L) 3.1(L) 2.9(L)  AST 15 - 41 U/L _1 ALT 17 - 63 U/L 15(L) 18 15(L)  Alk Phosphatase 38 - 126 U/L 48 54 53  Total Bilirubin 0.3 - 1.2 mg/dL 1.1 0.7 0.7    IMAGING/STUDIES: UDS 5/1:  Benzodiazepines EEG 5/2:  This recording of the awake and sleep states shows moderate global slowing. However, there is no epileptiform activity is observed. CT HEAD W/O 5/1:  No acute intracranial abnormality. CT HEAD W/O 5/11:  No acute intracranial findings. TTE 5/11:  LV  not well visualized. EF 60-65%. Unable to assess LV wall motion or diastolic dysfunction. LA normal in size. RV poorly visualized but appeared grossly normal in size and function. Aortic valve not well visualized but without significant stenosis or regurgitation. Aorta not well visualized. Mitral valve not well visualized but no grossly significant regurgitation or stenosis. Pulmonic valve not well visualized. Tricuspid valve not well visualized but without significant stenosis or regurgitation. No pericardial effusion. PORT CXR 5/12:  Personally reviewed by me. Endotracheal tube in good position. Right upper extremity peripherally inserted central venous catheter in good position. Enteric feeding tube coursing below diaphragm. Slight worsening of patchy opacification within right lower lung. Otherwise, no new findings.  EEG 5/12 >>> PORT CXR 5/12 >>>  MICROBIOLOGY: MRSA PCR 5/1:  Negative HIV 5/1:  Nonreactive  Blood Cultures x2 5/12 >>> Urine Culture 5/12 >>> Tracheal Aspirate  Culture 5/12 >>>  ANTIBIOTICS: Zosyn 5/2 >>> Vancomycin 5/12 >>>  SIGNIFICANT EVENTS: 05/01 - Admit to [facility] with seizures 05/02 - Valproate started 05/04 - Started on Keppra 05/10 - V tach w/ Haldol & Hypokalemia 05/11 - Code Blue w/ seizuing & hypoxia but no cardiac arrest >> reintubated by [facility] EDP 05/12 - Transfer to [facility] w/ re-intubation & septic shock  ASSESSMENT/PLAN:  51 y.o. male with recurrent seizures and borderline hypotension presenting on transfer to our facility for further treatment and evaluation by neurology.  1. Recurrent seizures: Ordering stat EEG. Seizure precautions ordered. Ativan IV when necessary seizure activity. Continuing Depakote and Keppra. Neurology consulted for further assessment and recommendations. Continuing propofol for sedation. 2. Acute hypoxic respiratory failure: Secondary to inability to protect airway in the setting of recurrent seizure. Continuing full ventilator support. Checking portable chest x-ray. Repeat ABG in the morning. Weaning FiO2. Holding on extubation/spontaneous breathing trial until patient is assessed by neurology & seizures are adequately controlled. 3. Probable aspiration pneumonia: Ordering tracheal aspirate for culture. Continuing empiric Zosyn and vancomycin.  4. Sepsis: Likely secondary to aspiration pneumonia. Cultures pending.Trending Procalcitonin per algorithm. Trending lactic acid and leukocytosis. 5. Borderline hypotension: LR 1 L bolus over one hour. Monitoring vitals per unit protocol. 6. Hypokalemia:  Replacing via IV KCl. Repeat BMP @ 1600 today. 7. Ventricular tachycardia: Likely medication induced. Checking serum magnesium level at 1600 today. Continuous telemetry monitoring. 8. Prolonged QTc: Likely medication induced. Maintaining magnesium level greater than 2.0 & potassium greater than 4.0. Checking EKG. Avoiding QT prolongating medications. 9. Alcohol abuse: Continuing folic acid & thiamine IV daily. Starting liquid  multivitamin daily. 10. COPD: No signs of acute exacerbation. Continuing scheduled albuterol.  Prophylaxis:  SCDs. Changing to Protonix via tube daily.  Diet:  NPO. Tube feedings per dietary recommendations.  Code Status:  Full Code per previous physician discussions.  Disposition:  Remains critically ill in the ICU.  Family Update:  No family at bedside at the time of my exam.   I have personally spent a total of 37 minutes of critical care time today caring for the patient & reviewing the patient's electronic medical record.  Sonia Baller Ashok Cordia, M.D. [facility] Pulmonary & Critical Care Pager:  [phone] After 3pm or if no response, call [phone] 12:48 PM 06/23/16

## 2016-06-23 NOTE — Progress Notes (Signed)
Called report to RN on [phone] that will be taking care of patient.[facility] is transporting now. Propofol, potassium, and NS bolus transported with team.

## 2016-06-23 NOTE — Progress Notes (Signed)
ANTIBIOTIC CONSULT NOTE-Preliminary  Pharmacy Consult for vancomycin Indication: sepsis  Allergies  Allergen Reactions  . Oxycodone-Acetaminophen     Severe rash and itching  . Oxycodone Hcl     itching  . Percocet [Oxycodone-Acetaminophen] Other (See Comments)    itching    Patient Measurements: Height: _0  (188 cm) Weight: 200 lb 2.8 oz (90.8 kg) IBW/kg (Calculated) : 82.2 Adjusted Body Weight:   Vital Signs: Temp: 101.1 F (38.4 C) (05/12 0000) Temp Source: Core (Comment) (05/12 0000) BP: 135/70 (05/12 0000) Pulse Rate: 128 (05/12 0000)  Labs:  Recent Labs  06/20/16 0911 06/21/16 0427 06/22/16 0151 06/22/16 1125  WBC 8.4  --   --  12.9*  HGB 15.0  --   --  14.6  PLT 190  --   --  210  CREATININE 0.66 0.63 0.60* 0.59*    Estimated Creatinine Clearance: 128.4 mL/min (A) (by C-G formula based on SCr of 0.59 mg/dL (L)).  No results for input(s): VANCOTROUGH, VANCOPEAK, VANCORANDOM, GENTTROUGH, GENTPEAK, GENTRANDOM, TOBRATROUGH, TOBRAPEAK, TOBRARND, AMIKACINPEAK, AMIKACINTROU, AMIKACIN in the last 72 hours.   Microbiology: Recent Results (from the past 720 hour(s))  MRSA PCR Screening     Status: None   Collection Time: 06/12/16  5:55 PM  Result Value Ref Range Status   MRSA by PCR NEGATIVE NEGATIVE Final    Comment:        The GeneXpert MRSA Assay (FDA approved for NASAL specimens only), is one component of a comprehensive MRSA colonization surveillance program. It is not intended to diagnose MRSA infection nor to guide or monitor treatment for MRSA infections.     Medical History: Past Medical History:  Diagnosis Date  . Arthritis   . Asthma   . Chronic neck pain   . COPD (chronic obstructive pulmonary disease) ([place])   . Injury of jawline   . Mental disorder   . Ruptured disc, cervical   . Seizures ([place])   . Torn rotator cuff     Medications:  Scheduled:  . albuterol  2.5 mg Nebulization Q4H  . chlorhexidine  15 mL Mouth/Throat BID   . chlorhexidine gluconate (MEDLINE KIT)  15 mL Mouth Rinse BID  . folic acid  1 mg Intravenous Daily  . insulin aspart  0-9 Units Subcutaneous Q4H  . mouth rinse  15 mL Mouth Rinse QID  . mouth rinse  15 mL Mouth Rinse QID  . pantoprazole (PROTONIX) IV  40 mg Intravenous Q12H  . sodium chloride flush  3 mL Intravenous Q12H  . thiamine  100 mg Intravenous Daily   Infusions:  . sodium chloride 110 mL/hr at 06/22/16 1800  . levETIRAcetam Stopped (06/22/16 2106)  . piperacillin-tazobactam (ZOSYN)  IV Stopped (06/22/16 2151)  . propofol (DIPRIVAN) infusion 15 mcg/kg/min (06/22/16 2008)  . valproate sodium Stopped (06/22/16 2351)  . vancomycin     PRN: acetaminophen **OR** acetaminophen, bisacodyl, docusate, fentaNYL (SUBLIMAZE) injection, fentaNYL (SUBLIMAZE) injection, hydrALAZINE, ipratropium-albuterol, LORazepam, LORazepam, ondansetron **OR** ondansetron (ZOFRAN) IV  Assessment: 51 yo male with hx TBI, chronic pain, seizure disorder, and COPD. Originally brought to ED after having multiple seizures, admitted to ICU with acute respiratory failure and for CIWA monitoring. Currently on Zosyn for aspiration PNA. Tonight had temp 101.8. Starting vancomycin for broader coverage.   Goal of Therapy:  Vancomycin trough level 15-20 mcg/ml  Plan:  Preliminary review of pertinent patient information completed.  Protocol will be initiated with dose(s) of vancomycin 1000 mg.  [name] clinical pharmacist will complete review  during morning rounds to assess patient and finalize treatment regimen if needed.  Dell Briner Scarlett, RPH 06/23/2016,1:28 AM

## 2016-06-23 NOTE — Progress Notes (Signed)
Subjective:  Sedated and intubated, unable to get any type of history.  No VT overnight  Objective:  Vital Signs in the last 24 hours: BP (!) 96/58   Pulse 98   Temp 98.2 F (36.8 C)   Resp 16   Ht 6\' 2"  (1.88 m)   Wt 90.8 kg (200 lb 2.8 oz)   SpO2 98%   BMI 25.70 kg/m   Physical Exam: Sedated male currently on the ventilator unresponsive Lungs:  Clear Cardiac:  Regular rhythm, normal S1 and S2, no S3 Extremities:  No edema present  Intake/Output from previous day: 05/11 0701 - 05/12 0700 In: 1827.2 [I.V.:1277.2; IV Piggyback:550] Out: 3200 [Urine:2300; Emesis/NG output:900]  Weight Filed Weights   06/21/16 0300 06/22/16 0500 06/22/16 1450  Weight: 93 kg (205 lb 0.4 oz) 93.7 kg (206 lb 9.1 oz) 90.8 kg (200 lb 2.8 oz)    Lab Results: Basic Metabolic Panel:  Recent Labs  16/11/9603/11/18 1125 06/23/16 0515  NA 141 141  K 3.8 3.1*  CL 102 103  CO2 29 27  GLUCOSE 155* 112*  BUN 5* 11  CREATININE 0.59* 0.79   CBC:  Recent Labs  06/22/16 1125 06/23/16 0211 06/23/16 0515  WBC 12.9* 19.1* 23.0*  NEUTROABS 10.1*  --  19.4*  HGB 14.6 13.8 14.7  HCT 42.2 39.4 41.8  MCV 93.2 92.9 93.7  PLT 210 238 234   Cardiac Enzymes: Cardiac Panel (last 3 results)  Recent Labs  06/21/16 2348 06/22/16 0501 06/22/16 1125  TROPONINI 0.06* 0.07* 0.05*    Telemetry: Reviewed -Normal sinus rhythm-no recurrence of VT  Assessment/Plan:  1.  Ventricular tachycardia likely occurring in the setting of electrolyte medication and catecholamine in balance is related to alcohol withdrawal.  His echocardiogram showed normal LV function.  He has not had any recurrent arrhythmias.  Recommendations:  No further cardiac workup necessary at this time.  Attention to electrolyte imbalance.  Alcohol withdrawal needs to be managed.  Please call if further questions.     Darden PalmerW. Spencer Alverna Fawley, Jr.  MD [facility] Cardiology  06/23/2016, 12:26 PM

## 2016-06-23 NOTE — Progress Notes (Signed)
Pharmacy Antibiotic Note  [name] is a 51 y.o. male admitted on 06/12/2016 with sepsis.  Pharmacy has been consulted for Vancomycin dosing.  Plan: Vancomycin 1 GM IV given last evening, start Vancomycin 1 GM IV every 8 hours.  Goal trough 15-20 mcg/mL.  Vancomycin trough at steady state Monitor renal function  Height: 6\' 2"  (188 cm) Weight: 200 lb 2.8 oz (90.8 kg) IBW/kg (Calculated) : 82.2  Temp (24hrs), Avg:100.9 F (38.3 C), Min:98.1 F (36.7 C), Max:103.5 F (39.7 C)   Recent Labs Lab 06/20/16 0911 06/21/16 0427 06/22/16 0151 06/22/16 1125 06/23/16 0211 06/23/16 0515  WBC 8.4  --   --  12.9* 19.1* 23.0*  CREATININE 0.66 0.63 0.60* 0.59*  --  0.79    Estimated Creatinine Clearance: 128.4 mL/min (by C-G formula based on SCr of 0.79 mg/dL).    Allergies  Allergen Reactions  . Oxycodone-Acetaminophen     Severe rash and itching  . Oxycodone Hcl     itching  . Percocet [Oxycodone-Acetaminophen] Other (See Comments)    itching    Antimicrobials this admission: Zosyn  5/2 >>  Vancomycin 5/12  >>     Thank you for allowing pharmacy to be a part of this patient's care.  Josephine Igoittman, Zaryan Yakubov Bennett 06/23/2016 7:33 AM

## 2016-06-23 NOTE — Progress Notes (Signed)
Told in report that patient will need MRI [facility] A receiving doctor will need to be assigned there due to no doctor on staff to take care of patient while in Radiology. Still on cooling blanket this morning.

## 2016-06-23 NOTE — Progress Notes (Signed)
eLink Physician-Brief Progress Note Patient Name: Clifford LocusSteven L Morris DOB: [birth year] MRN: [id number]   Date of Service  06/23/2016  HPI/Events of Note  Was called for "soft BP"  eICU Interventions  BP is improved now. 102/59(79).         [name] 06/23/2016, 9:11 PM

## 2016-06-23 NOTE — Progress Notes (Signed)
Patient assessment done and scored an 11. Due mainly to bad x rays and vent at the moment. Changed to Duo TID from Alb Q4 and will reassess as needed.

## 2016-06-23 NOTE — Progress Notes (Addendum)
Patient still has a large amount of yellow secretions coming up when he coughs against vent. Have dumped tubing 2 times between HME and et tube. Patient appears to have new red color OG tube, where it was brown before. Nursing has been notified. Also have managed to decrease oxygen to 40% saturation still 98.

## 2016-06-23 NOTE — Progress Notes (Signed)
Patient still collecting moderate amount of yellow secretions between et tub and hme.

## 2016-06-23 NOTE — Progress Notes (Signed)
eLink Physician-Brief Progress Note Patient Name: [name] DOB: Mar 02, 1965 MRN: [id number]   Date of Service  06/23/2016  HPI/Events of Note  Patient with temp of 101.8.  Has not responded to tylenol.  On Zosyn for asp PNA  eICU Interventions  Plan: Continue with tylenol Ice packs and cooling blanket as needed Blood cultures times 2 UA and Urine culture Broaden coverage with vancomycin        DETERDING,ELIZABETH 06/23/2016, 1:11 AM

## 2016-06-23 NOTE — Progress Notes (Signed)
Daughters came by to see their dad and check on him. They had some concerns and want the doctor to follow-up and talk with them about what is going on. I added their numbers to the list per family request. No POA in chart or on file right now. But step-son is listed "to be called first" but has no legal authority at this time per chart,per family. The dad is also listed which is whom we spoke with as well.There is also concern within the family members and this is something they would like the doctor to address. Will pass onto day shift nurse and make Dr.Hawkins aware of situation.  Cristie Hemiffani K Yolanda Dockendorf, RN 12:17 AM 06/23/16

## 2016-06-23 NOTE — Procedures (Signed)
EEG Report  Clinical History:  Recurrent seizures in patient with history of TBI  Technical Summary:  A 19 channel digital EEG recording was performed using the 10-20 international system of electrode placement.  Bipolar and Referential montages were used.  The total recording time was approx 20 minutes.  Findings:  The patient is in and out of sleep during the recording.  There is a posterior dominant rhythm of 9 Hz reactive to eye opening and closure when he is awake with eyes closed.  No focal slowing is present.  Frequently he is in stage 2 sleep with frontocentral sleep spindles and vertex sharp waves.  There were no epileptiform discharges or electrographic seizures present.  Impression:  This is a normal EEG in the awake and sleep states.  Weston SettleShervin Venessa Wickham, MS, MD

## 2016-06-23 NOTE — Progress Notes (Signed)
Nutrition Follow-up  DOCUMENTATION CODES:  Severe malnutrition in context of acute illness/injury  INTERVENTION:  Initiate TF via OGT with  Vital AF 1.2 at goal rate of 75 ml/h (1800 ml per day) to provide 2160 (+296 from diprivan) kcals, 135 gm protein, 1460 ml free water daily.  Given 11 days w/o nutrition, will begin at 50% goal  NUTRITION DIAGNOSIS:  Malnutrition (Severe) related to lethargy/confusion, inability to eat as evidenced by an energy intake that has met < or equal to 50% of needs for > or equal to 5 days and an apparent loss of >2% bw x1 week  Ongoing, worsening  GOAL:  Patient will meet greater than or equal to 90% of their needs   Not met  MONITOR:  Diet advancement, Vent status, Labs, TF tolerance, I & O's, Weight trends  ASSESSMENT:  51 y/o male PMHx TBI, Chronic Pain, seizure disorder, COPD. Initially presented to ED after experiencing significant seizures. Was briefly intubated in the ED for respiratory failure, self extubated shortly after. Has been maintained on Bipap and sedation majority of admission for AMS/Combativeness.   Interval Hx 5/9. First BM since admit. Diet advanced to Soft-no intake. Agitation Continues 5/10: Preceded eventually able to be turned off late that night. Experienced V tach over night 5/11: Cardiology consult. Code Blue. Intubated. Placed on Propofol. Potentially had seizure and aspirated again. 5/12: Transfer to [facility]. Temp/WBC increasing   Abdomen is soft, non distended.   Propofol rate unchanged from yesterday.   Much more febrile, large jump in Kcal recs.   Patient is currently intubated on ventilator support MV: 10.6 L/min Temp (24hrs), Avg:101.3 F (38.5 C), Min:98.1 F (36.7 C), Max:103.5 F (39.7 C)  Propofol: 11.2 ml/hr =296 kacls/day  He was admitted at weight of 242 lbs, however likely reported-bed weight 5/1 was 226 lbs. Subsequent measurements have varied from 205-218 lbs. Driest wt is  200 lbs 2.8 oz.    Physical Exam: Mild orbital fat wasting. Mild wasting of clavicular, quadriceps and clavicular musculature.   Meds: Folate, MVI, PPI,  THiamine, IVF,   IV abx, Keppra, Depacon, Labs: Febrile TG 133, WBC 12.9->23 today, Albumin: 3.0, Glu 84   Recent Labs Lab 06/21/16 2348 06/22/16 0151 06/22/16 1125 06/23/16 0515  NA  --  142 141 141  K  --  2.8* 3.8 3.1*  CL  --  105 102 103  CO2  --  _0 BUN  --  5* 5* 11  CREATININE  --  0.60* 0.59* 0.79  CALCIUM  --  8.4* 8.2* 8.7*  MG 2.4  --   --   --   GLUCOSE  --  101* 155* 112*   Diet Order:  Diet NPO time specified  Skin:  Reviewed, no issues  Last BM:  5/11-diarrhea  Height:  Ht Readings from Last 1 Encounters:  06/23/16 _1  (1.88 m)   Weight:  Wt Readings from Last 1 Encounters:  06/23/16 203 lb 11.3 oz (92.4 kg)   Wt Readings from Last 10 Encounters:  06/23/16 203 lb 11.3 oz (92.4 kg)  05/09/16 225 lb (102.1 kg)  12/15/15 252 lb (114.3 kg)  02/13/15 265 lb (120.2 kg)  05/26/11 240 lb (108.9 kg)  01/19/11 240 lb (108.9 kg)  10/17/10 240 lb (108.9 kg)   Ideal Body Weight:  86.36 kg  BMI:  Body mass index is 26.15 kg/m.  Estimated Nutritional Needs:  Kcal:  2515 kcals (PSU-2003b) Protein:  127-145g Pro (1.4-1.6 g/kg bw)  Fluid:  >2.2 L fluid (67m/kcal)  EDUCATION NEEDS:  No education needs identified at this time  NBurtis JunesRD, LDN, CWel[facility] Spring HeightsNutrition Pager: [phone]/01/2017 3:17 PM

## 2016-06-23 NOTE — Progress Notes (Signed)
eLink Physician-Brief Progress Note Patient Name: Roselle LocusSteven L Cawthorn DOB: 1965-11-28 MRN: [id number]   Date of Service  06/23/2016  HPI/Events of Note  New onset of increase in bloody secretions from OGT.  Approx 500 cc.  Change from coffee ground to BRB.  HD stable.  eICU Interventions  Plan: Stat CBC Stop anticoagulant SCDs BID protonix Consider GI consult     Intervention Category Intermediate Interventions: Bleeding - evaluation and treatment with blood products  Mitsugi Schrader 06/23/2016, 12:50 AM

## 2016-06-23 NOTE — Progress Notes (Addendum)
Subjective: He had more difficulty through the night. He appears to be septic now. Temperatures up 103. He remains on the ventilator. Still having essentially no response he is sedated. He was hypotensive but that's better this morning. MRI has been ordered but can't be done here. I think with the fever he probably needs lumbar puncture as well and that can't be done here until Monday. He's had what looks like some blood from his gastric tube  Objective: Vital signs in last 24 hours: Temp:  [98.1 F (36.7 C)-103.5 F (39.7 C)] 99.5 F (37.5 C) (05/12 0800) Pulse Rate:  [88-135] 98 (05/12 0700) Resp:  [10-34] 16 (05/12 0800) BP: (70-199)/(46-107) 75/63 (05/12 0800) SpO2:  [76 %-100 %] 100 % (05/12 0750) FiO2 (%):  [40 %-100 %] 40 % (05/12 0750) Weight:  [90.8 kg (200 lb 2.8 oz)] 90.8 kg (200 lb 2.8 oz) (05/11 1450) Weight change: -2.9 kg (-6 lb 6.3 oz) Last BM Date: 06/22/16  Intake/Output from previous day: 05/11 0701 - 05/12 0700 In: 1827.2 [I.V.:1277.2; IV Piggyback:550] Out: 3200 [Urine:2300; Emesis/NG output:900]  PHYSICAL EXAM General appearance: Intubated sedated on mechanical ventilation Resp: rhonchi bilaterally Cardio: regular rate and rhythm, S1, S2 normal, no murmur, click, rub or gallop GI: soft, non-tender; bowel sounds normal; no masses,  no organomegaly Extremities: extremities normal, atraumatic, no cyanosis or edema Skin turgor poor  Lab Results:  Results for orders placed or performed during the hospital encounter of 06/12/16 (from the past 48 hour(s))  Glucose, capillary     Status: Abnormal   Collection Time: 06/21/16 12:57 PM  Result Value Ref Range   Glucose-Capillary 106 (H) 65 - 99 mg/dL  Glucose, capillary     Status: Abnormal   Collection Time: 06/21/16  5:03 PM  Result Value Ref Range   Glucose-Capillary 109 (H) 65 - 99 mg/dL  Valproic acid level     Status: None   Collection Time: 06/21/16  7:42 PM  Result Value Ref Range   Valproic Acid Lvl 61  50.0 - 100.0 ug/mL  Ammonia     Status: Abnormal   Collection Time: 06/21/16  7:42 PM  Result Value Ref Range   Ammonia 46 (H) 9 - 35 umol/L  Glucose, capillary     Status: None   Collection Time: 06/21/16  8:03 PM  Result Value Ref Range   Glucose-Capillary 93 65 - 99 mg/dL  Glucose, capillary     Status: Abnormal   Collection Time: 06/21/16 11:43 PM  Result Value Ref Range   Glucose-Capillary 101 (H) 65 - 99 mg/dL  Magnesium     Status: None   Collection Time: 06/21/16 11:48 PM  Result Value Ref Range   Magnesium 2.4 1.7 - 2.4 mg/dL  Troponin I     Status: Abnormal   Collection Time: 06/21/16 11:48 PM  Result Value Ref Range   Troponin I 0.06 (HH) <0.03 ng/mL    Comment: CRITICAL RESULT CALLED TO, READ BACK BY AND VERIFIED WITH: ROBERTS, T. AT 0030 ON 06/22/2016 BY EVA   Basic metabolic panel     Status: Abnormal   Collection Time: 06/22/16  1:51 AM  Result Value Ref Range   Sodium 142 135 - 145 mmol/L   Potassium 2.8 (L) 3.5 - 5.1 mmol/L   Chloride 105 101 - 111 mmol/L   CO2 28 22 - 32 mmol/L   Glucose, Bld 101 (H) 65 - 99 mg/dL   BUN 5 (L) 6 - 20 mg/dL   Creatinine, Ser  0.60 (L) 0.61 - 1.24 mg/dL   Calcium 8.4 (L) 8.9 - 10.3 mg/dL   GFR calc non Af Amer >60 >60 mL/min   GFR calc Af Amer >60 >60 mL/min    Comment: (NOTE) The eGFR has been calculated using the CKD EPI equation. This calculation has not been validated in all clinical situations. eGFR's persistently <60 mL/min signify possible Chronic Kidney Disease.    Anion gap 9 5 - 15  Glucose, capillary     Status: None   Collection Time: 06/22/16  4:20 AM  Result Value Ref Range   Glucose-Capillary 99 65 - 99 mg/dL  Troponin I     Status: Abnormal   Collection Time: 06/22/16  5:01 AM  Result Value Ref Range   Troponin I 0.07 (HH) <0.03 ng/mL    Comment: CRITICAL RESULT CALLED TO, READ BACK BY AND VERIFIED WITH: DANIELS,J AT 6:15AM ON 06/22/16 BY FESTERMAN,C   Glucose, capillary     Status: Abnormal    Collection Time: 06/22/16  7:54 AM  Result Value Ref Range   Glucose-Capillary 103 (H) 65 - 99 mg/dL   Comment 1 Notify RN    Comment 2 Document in Chart   Glucose, capillary     Status: Abnormal   Collection Time: 06/22/16 11:09 AM  Result Value Ref Range   Glucose-Capillary 127 (H) 65 - 99 mg/dL   Comment 1 Notify RN    Comment 2 Document in Chart   Troponin I     Status: Abnormal   Collection Time: 06/22/16 11:25 AM  Result Value Ref Range   Troponin I 0.05 (HH) <0.03 ng/mL    Comment: CRITICAL VALUE NOTED.  VALUE IS CONSISTENT WITH PREVIOUSLY REPORTED AND CALLED VALUE.  Comprehensive metabolic panel     Status: Abnormal   Collection Time: 06/22/16 11:25 AM  Result Value Ref Range   Sodium 141 135 - 145 mmol/L   Potassium 3.8 3.5 - 5.1 mmol/L    Comment: DELTA CHECK NOTED   Chloride 102 101 - 111 mmol/L   CO2 29 22 - 32 mmol/L   Glucose, Bld 155 (H) 65 - 99 mg/dL   BUN 5 (L) 6 - 20 mg/dL   Creatinine, Ser 0.59 (L) 0.61 - 1.24 mg/dL   Calcium 8.2 (L) 8.9 - 10.3 mg/dL   Total Protein 7.4 6.5 - 8.1 g/dL   Albumin 3.1 (L) 3.5 - 5.0 g/dL   AST 22 15 - 41 U/L   ALT 18 17 - 63 U/L   Alkaline Phosphatase 54 38 - 126 U/L   Total Bilirubin 0.7 0.3 - 1.2 mg/dL   GFR calc non Af Amer >60 >60 mL/min   GFR calc Af Amer >60 >60 mL/min    Comment: (NOTE) The eGFR has been calculated using the CKD EPI equation. This calculation has not been validated in all clinical situations. eGFR's persistently <60 mL/min signify possible Chronic Kidney Disease.    Anion gap 10 5 - 15  CBC with Differential/Platelet     Status: Abnormal   Collection Time: 06/22/16 11:25 AM  Result Value Ref Range   WBC 12.9 (H) 4.0 - 10.5 K/uL   RBC 4.53 4.22 - 5.81 MIL/uL   Hemoglobin 14.6 13.0 - 17.0 g/dL   HCT 42.2 39.0 - 52.0 %   MCV 93.2 78.0 - 100.0 fL   MCH 32.2 26.0 - 34.0 pg   MCHC 34.6 30.0 - 36.0 g/dL   RDW 12.3 11.5 - 15.5 %   Platelets  210 150 - 400 K/uL   Neutrophils Relative % 79 %   Neutro  Abs 10.1 (H) 1.7 - 7.7 K/uL   Lymphocytes Relative 11 %   Lymphs Abs 1.5 0.7 - 4.0 K/uL   Monocytes Relative 9 %   Monocytes Absolute 1.2 (H) 0.1 - 1.0 K/uL   Eosinophils Relative 1 %   Eosinophils Absolute 0.1 0.0 - 0.7 K/uL   Basophils Relative 0 %   Basophils Absolute 0.0 0.0 - 0.1 K/uL  Valproic acid level     Status: None   Collection Time: 06/22/16 11:25 AM  Result Value Ref Range   Valproic Acid Lvl 54 50.0 - 100.0 ug/mL  Triglycerides     Status: None   Collection Time: 06/22/16 11:30 AM  Result Value Ref Range   Triglycerides 133 <150 mg/dL  Draw ABG 1 hour after initiation of ventilator     Status: Abnormal   Collection Time: 06/22/16 12:25 PM  Result Value Ref Range   FIO2 100.00    Delivery systems VENTILATOR    Mode PRESSURE REGULATED VOLUME CONTROL    VT 660 mL   LHR 16 resp/min   Peep/cpap 5.0 cm H20   pH, Arterial 7.394 7.350 - 7.450   pCO2 arterial 43.6 32.0 - 48.0 mmHg   pO2, Arterial 64.8 (L) 83.0 - 108.0 mmHg   Bicarbonate 25.4 20.0 - 28.0 mmol/L   Acid-Base Excess 1.6 0.0 - 2.0 mmol/L   O2 Saturation 91.2 %   Patient temperature 37.0    Collection site LEFT BRACHIAL    Drawn by [id number]    Sample type ARTERIAL DRAW   Glucose, capillary     Status: None   Collection Time: 06/22/16  4:26 PM  Result Value Ref Range   Glucose-Capillary 76 65 - 99 mg/dL   Comment 1 Notify RN    Comment 2 Document in Chart   Glucose, capillary     Status: Abnormal   Collection Time: 06/22/16  8:20 PM  Result Value Ref Range   Glucose-Capillary 101 (H) 65 - 99 mg/dL   Comment 1 Notify RN    Comment 2 Document in Chart   Glucose, capillary     Status: Abnormal   Collection Time: 06/22/16 11:28 PM  Result Value Ref Range   Glucose-Capillary 124 (H) 65 - 99 mg/dL   Comment 1 Notify RN    Comment 2 Document in Chart   CBC     Status: Abnormal   Collection Time: 06/23/16  2:11 AM  Result Value Ref Range   WBC 19.1 (H) 4.0 - 10.5 K/uL   RBC 4.24 4.22 - 5.81 MIL/uL    Hemoglobin 13.8 13.0 - 17.0 g/dL   HCT 39.4 39.0 - 52.0 %   MCV 92.9 78.0 - 100.0 fL   MCH 32.5 26.0 - 34.0 pg   MCHC 35.0 30.0 - 36.0 g/dL   RDW 12.6 11.5 - 15.5 %   Platelets 238 150 - 400 K/uL  Culture, blood (Routine X 2) w Reflex to ID Panel     Status: None (Preliminary result)   Collection Time: 06/23/16  2:11 AM  Result Value Ref Range   Specimen Description A-LINE    Special Requests      BOTTLES DRAWN AEROBIC AND ANAEROBIC Blood Culture results may not be optimal due to an inadequate volume of blood received in culture bottles DRAWN BY RN   Culture PENDING    Report Status PENDING   Culture, blood (Routine X  2) w Reflex to ID Panel     Status: None (Preliminary result)   Collection Time: 06/23/16  2:11 AM  Result Value Ref Range   Specimen Description LEFT ANTECUBITAL    Special Requests      BOTTLES DRAWN AEROBIC AND ANAEROBIC Blood Culture results may not be optimal due to an inadequate volume of blood received in culture bottles   Culture PENDING    Report Status PENDING   Blood gas, arterial     Status: Abnormal   Collection Time: 06/23/16  4:25 AM  Result Value Ref Range   FIO2 40.00    Delivery systems PRESSURE REGULATED VOLUME CONTROL    Mode VENTILATOR    VT 660 mL   LHR 16 resp/min   Peep/cpap 5.0 cm H20   pH, Arterial 7.447 7.350 - 7.450   pCO2 arterial 39.4 32.0 - 48.0 mmHg   pO2, Arterial 112.0 (H) 83.0 - 108.0 mmHg   Bicarbonate 27.2 20.0 - 28.0 mmol/L   Acid-Base Excess 2.8 (H) 0.0 - 2.0 mmol/L   O2 Saturation 97.6 %   Patient temperature 38.9    Collection site BRACHIAL ARTERY    Drawn by [id number]    Sample type ARTERIAL    Allens test (pass/fail) NOT INDICATED (A) PASS  Glucose, capillary     Status: None   Collection Time: 06/23/16  4:53 AM  Result Value Ref Range   Glucose-Capillary 92 65 - 99 mg/dL   Comment 1 Notify RN    Comment 2 Document in Chart   Comprehensive metabolic panel     Status: Abnormal   Collection Time: 06/23/16  5:15 AM   Result Value Ref Range   Sodium 141 135 - 145 mmol/L   Potassium 3.1 (L) 3.5 - 5.1 mmol/L    Comment: DELTA CHECK NOTED   Chloride 103 101 - 111 mmol/L   CO2 27 22 - 32 mmol/L   Glucose, Bld 112 (H) 65 - 99 mg/dL   BUN 11 6 - 20 mg/dL   Creatinine, Ser 0.79 0.61 - 1.24 mg/dL   Calcium 8.7 (L) 8.9 - 10.3 mg/dL   Total Protein 7.3 6.5 - 8.1 g/dL   Albumin 3.0 (L) 3.5 - 5.0 g/dL   AST 25 15 - 41 U/L   ALT 15 (L) 17 - 63 U/L   Alkaline Phosphatase 48 38 - 126 U/L   Total Bilirubin 1.1 0.3 - 1.2 mg/dL   GFR calc non Af Amer >60 >60 mL/min   GFR calc Af Amer >60 >60 mL/min    Comment: (NOTE) The eGFR has been calculated using the CKD EPI equation. This calculation has not been validated in all clinical situations. eGFR's persistently <60 mL/min signify possible Chronic Kidney Disease.    Anion gap 11 5 - 15  CBC with Differential/Platelet     Status: Abnormal   Collection Time: 06/23/16  5:15 AM  Result Value Ref Range   WBC 23.0 (H) 4.0 - 10.5 K/uL   RBC 4.46 4.22 - 5.81 MIL/uL   Hemoglobin 14.7 13.0 - 17.0 g/dL   HCT 41.8 39.0 - 52.0 %   MCV 93.7 78.0 - 100.0 fL   MCH 33.0 26.0 - 34.0 pg   MCHC 35.2 30.0 - 36.0 g/dL   RDW 12.5 11.5 - 15.5 %   Platelets 234 150 - 400 K/uL   Neutrophils Relative % 85 %   Neutro Abs 19.4 (H) 1.7 - 7.7 K/uL   Lymphocytes Relative 6 %  Lymphs Abs 1.5 0.7 - 4.0 K/uL   Monocytes Relative 9 %   Monocytes Absolute 2.1 (H) 0.1 - 1.0 K/uL   Eosinophils Relative 0 %   Eosinophils Absolute 0.0 0.0 - 0.7 K/uL   Basophils Relative 0 %   Basophils Absolute 0.0 0.0 - 0.1 K/uL  Glucose, capillary     Status: Abnormal   Collection Time: 06/23/16  7:28 AM  Result Value Ref Range   Glucose-Capillary 105 (H) 65 - 99 mg/dL    ABGS  Recent Labs  06/23/16 0425  PHART 7.447  PO2ART 112.0*  HCO3 27.2   CULTURES Recent Results (from the past 240 hour(s))  Culture, blood (Routine X 2) w Reflex to ID Panel     Status: None (Preliminary result)    Collection Time: 06/23/16  2:11 AM  Result Value Ref Range Status   Specimen Description A-LINE  Final   Special Requests   Final    BOTTLES DRAWN AEROBIC AND ANAEROBIC Blood Culture results may not be optimal due to an inadequate volume of blood received in culture bottles DRAWN BY RN   Culture PENDING  Incomplete   Report Status PENDING  Incomplete  Culture, blood (Routine X 2) w Reflex to ID Panel     Status: None (Preliminary result)   Collection Time: 06/23/16  2:11 AM  Result Value Ref Range Status   Specimen Description LEFT ANTECUBITAL  Final   Special Requests   Final    BOTTLES DRAWN AEROBIC AND ANAEROBIC Blood Culture results may not be optimal due to an inadequate volume of blood received in culture bottles   Culture PENDING  Incomplete   Report Status PENDING  Incomplete   Studies/Results: Ct Head Wo Contrast  Result Date: 06/22/2016 CLINICAL DATA:  Code fixation. Hypoxia. Recent aspiration pneumonia. History of traumatic brain injury. EXAM: CT HEAD WITHOUT CONTRAST TECHNIQUE: Contiguous axial images were obtained from the base of the skull through the vertex without intravenous contrast. COMPARISON:  06/12/2016 and 05/27/2011 FINDINGS: Brain: Ventricles, cisterns and other CSF spaces are normal. There is no mass, mass effect, shift of midline structures or acute hemorrhage. No evidence of acute infarction. Vascular: Within normal. Skull: Within normal. Sinuses/Orbits: Within normal. Other: None. IMPRESSION: No acute intracranial findings. Electronically Signed   By: Marin Olp M.D.   On: 06/22/2016 15:15   Dg Chest Port 1 View  Result Date: 06/23/2016 CLINICAL DATA:  Respiratory failure EXAM: PORTABLE CHEST 1 VIEW COMPARISON:  Jun 22, 2016 FINDINGS: Support apparatus is in good position. No pneumothorax. The cardiomediastinal silhouette is normal. No acute pulmonary opacities identified. IMPRESSION: Stable support apparatus. Electronically Signed   By: Dorise Bullion III M.D    On: 06/23/2016 08:51   Dg Chest Port 1 View  Result Date: 06/22/2016 CLINICAL DATA:  Encounter for intubation. EXAM: PORTABLE CHEST 1 VIEW COMPARISON:  Radiograph earlier this day at 1129 hour FINDINGS: Endotracheal tube 4.3 cm from the carina. Enteric tube in place, tip below the diaphragm not included in the field of view. Right upper extremity PICC tip in the region of the distal SVC. Volume loss in the right lung base appears new. Improved perihilar opacities from prior exam. Unchanged heart size and mediastinal contours. No evidence of pneumothorax. IMPRESSION: 1. Endotracheal tube 4.3 cm from the carina. Enteric tube and right upper extremity PICC in place. 2. Volume loss at the right lung base concerning for partial lobar atelectasis/collapse. 3. Otherwise improving perihilar opacities likely improving edema. Electronically Signed  By: Jeb Levering M.D.   On: 06/22/2016 20:50   Dg Chest Port 1 View  Result Date: 06/22/2016 CLINICAL DATA:  Intubation EXAM: PORTABLE CHEST 1 VIEW COMPARISON:  06/13/2016 FINDINGS: Endotracheal tube is 6 cm above the carina. NG tube is in the stomach. Right PICC line is in place with the tip near the cavoatrial junction. Mild perihilar airspace opacities are noted, left greater than right. Heart is normal size. No effusions. IMPRESSION: Endotracheal tube 6 cm above the carina. Mild perihilar airspace opacities, left greater than right, likely mild edema. Electronically Signed   By: Rolm Baptise M.D.   On: 06/22/2016 11:44    Medications:  Prior to Admission:  Prescriptions Prior to Admission  Medication Sig Dispense Refill Last Dose  . albuterol (PROVENTIL HFA;VENTOLIN HFA) 108 (90 BASE) MCG/ACT inhaler Inhale 2 puffs into the lungs every 6 (six) hours as needed. For shortness of breath   unknown  . ALPRAZolam (XANAX) 1 MG tablet Take 1 mg by mouth 3 (three) times daily as needed for anxiety.   06/12/2016 at Unknown time  . oxyCODONE-acetaminophen (PERCOCET)  10-325 MG tablet Take 1 tablet by mouth 4 (four) times daily as needed for pain.   Past Week at Unknown time   Scheduled: . albuterol  2.5 mg Nebulization Q4H  . chlorhexidine  15 mL Mouth/Throat BID  . chlorhexidine gluconate (MEDLINE KIT)  15 mL Mouth Rinse BID  . folic acid  1 mg Intravenous Daily  . insulin aspart  0-9 Units Subcutaneous Q4H  . mouth rinse  15 mL Mouth Rinse QID  . mouth rinse  15 mL Mouth Rinse QID  . pantoprazole (PROTONIX) IV  40 mg Intravenous Q12H  . sodium chloride flush  3 mL Intravenous Q12H  . thiamine  100 mg Intravenous Daily   Continuous: . sodium chloride 110 mL/hr at 06/22/16 1800  . levETIRAcetam Stopped (06/23/16 0629)  . piperacillin-tazobactam (ZOSYN)  IV Stopped (06/23/16 6226)  . potassium chloride    . propofol (DIPRIVAN) infusion 20 mcg/kg/min (06/23/16 3335)  . sodium chloride    . valproate sodium Stopped (06/23/16 0714)  . vancomycin 1,000 mg (06/23/16 0835)   KTG:[id number] **OR** acetaminophen, bisacodyl, docusate, fentaNYL (SUBLIMAZE) injection, fentaNYL (SUBLIMAZE) injection, hydrALAZINE, ipratropium-albuterol, LORazepam, LORazepam, ondansetron **OR** ondansetron (ZOFRAN) IV  Assesment: He is septic now. I have ordered a liter of saline. His antibiotics were broadened last night. He has aspirated and has aspiration pneumonia from the start of his hospitalization. Considering his altered mental status he may have some sort of CNS infection.  He has hypokalemia and with more significant hypokalemia and with Haldol he had ventricular tachycardia. I have ordered potassium replacement.  He had seizures and he's had some posturing and I'm not sure if that is from seizures or if that is more related to CNS injury. He needs to have an MRI that cannot be done here.  He is intubated for airway protection and has respiratory failure.  He has metabolic encephalopathy which has not resolved  At baseline he has some COPD.  When he arrived  at the hospital he had alcohol intoxication after binge drinking.  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  He has protein calorie malnutrition and I had intended to start nutritional support today now that we have control of the situation  but with the blood in his gastric tube I'm not going to do that Principal Problem:   Seizures ([place]) Active Problems:   Alcohol abuse with intoxication ([place])   COPD (chronic obstructive pulmonary disease) ([place])   History of traumatic brain injury   Endotracheally intubated   Respiratory failure ([facility])   Aspiration pneumonia ([facility])   Metabolic encephalopathy   Protein-calorie malnutrition, severe    Plan: I think he needs transfer to higher level of care. Discussed with Dr. Ashok Cordia of critical care medicine and he agrees to take him in transfer    LOS: 11 days   Cintia Gleed L 06/23/2016, 9:06 AM

## 2016-06-24 ENCOUNTER — Encounter (HOSPITAL_COMMUNITY): Payer: Self-pay | Admitting: *Deleted

## 2016-06-24 ENCOUNTER — Inpatient Hospital Stay (HOSPITAL_COMMUNITY): Payer: Medicaid Other

## 2016-06-24 LAB — RENAL FUNCTION PANEL
ANION GAP: 7 (ref 5–15)
Albumin: 2.2 g/dL — ABNORMAL LOW (ref 3.5–5.0)
BUN: 10 mg/dL (ref 6–20)
CHLORIDE: 108 mmol/L (ref 101–111)
CO2: 26 mmol/L (ref 22–32)
Calcium: 8.1 mg/dL — ABNORMAL LOW (ref 8.9–10.3)
Creatinine, Ser: 0.75 mg/dL (ref 0.61–1.24)
GFR calc non Af Amer: >60 mL/min (ref >60)
Glucose, Bld: 119 mg/dL — ABNORMAL HIGH (ref 65–99)
Phosphorus: 2 mg/dL — ABNORMAL LOW (ref 2.5–4.6)
Potassium: 3.1 mmol/L — ABNORMAL LOW (ref 3.5–5.1)
Sodium: 141 mmol/L (ref 135–145)

## 2016-06-24 LAB — BLOOD GAS, ARTERIAL
ACID-BASE EXCESS: 3.8 mmol/L — AB (ref 0.0–2.0)
BICARBONATE: 27 mmol/L (ref 20.0–28.0)
Drawn by: 24513
FIO2: 40
LHR: 16 {breaths}/min
O2 SAT: 96.3 %
PATIENT TEMPERATURE: 96.8
PEEP/CPAP: 5 cmH2O
VT: 650 mL
pCO2 arterial: 32.7 mmHg (ref 32.0–48.0)
pH, Arterial: 7.521 — ABNORMAL HIGH (ref 7.350–7.450)
pO2, Arterial: 75.3 mmHg — ABNORMAL LOW (ref 83.0–108.0)

## 2016-06-24 LAB — MAGNESIUM: Magnesium: 1.9 mg/dL (ref 1.7–2.4)

## 2016-06-24 LAB — URINE CULTURE: Culture: NO GROWTH

## 2016-06-24 LAB — CBC WITH DIFFERENTIAL/PLATELET
Basophils Absolute: 0 10*3/uL (ref 0.0–0.1)
Basophils Relative: 0 %
EOS ABS: 0.2 10*3/uL (ref 0.0–0.7)
Eosinophils Relative: 2 %
HEMATOCRIT: 32.1 % — AB (ref 39.0–52.0)
HEMOGLOBIN: 10.7 g/dL — AB (ref 13.0–17.0)
LYMPHS ABS: 1.6 10*3/uL (ref 0.7–4.0)
Lymphocytes Relative: 15 %
MCH: 31.5 pg (ref 26.0–34.0)
MCHC: 33.3 g/dL (ref 30.0–36.0)
MCV: 94.4 fL (ref 78.0–100.0)
MONOS PCT: 8 %
Monocytes Absolute: 0.9 10*3/uL (ref 0.1–1.0)
NEUTROS ABS: 8.6 10*3/uL — AB (ref 1.7–7.7)
NEUTROS PCT: 75 %
Platelets: 181 10*3/uL (ref 150–400)
RBC: 3.4 MIL/uL — ABNORMAL LOW (ref 4.22–5.81)
RDW: 13.1 % (ref 11.5–15.5)
WBC: 11.3 10*3/uL — ABNORMAL HIGH (ref 4.0–10.5)

## 2016-06-24 LAB — CALCIUM, IONIZED: Calcium, Ionized, Serum: 4.9 mg/dL (ref 4.5–5.6)

## 2016-06-24 LAB — LACTIC ACID, PLASMA: Lactic Acid, Venous: 0.7 mmol/L (ref 0.5–1.9)

## 2016-06-24 LAB — GLUCOSE, CAPILLARY
GLUCOSE-CAPILLARY: 120 mg/dL — AB (ref 65–99)
GLUCOSE-CAPILLARY: 113 mg/dL — AB (ref 65–99)
Glucose-Capillary: 107 mg/dL — ABNORMAL HIGH (ref 65–99)
Glucose-Capillary: 125 mg/dL — ABNORMAL HIGH (ref 65–99)
Glucose-Capillary: 132 mg/dL — ABNORMAL HIGH (ref 65–99)
Glucose-Capillary: 94 mg/dL (ref 65–99)

## 2016-06-24 LAB — PROCALCITONIN: Procalcitonin: <0.1 ng/mL

## 2016-06-24 MED ORDER — POTASSIUM CHLORIDE 10 MEQ/100ML IV SOLN
10.0000 meq | INTRAVENOUS | Status: AC
  Administered 2016-06-24 (×6): 10 meq via INTRAVENOUS
  Filled 2016-06-24 (×5): qty 100

## 2016-06-24 MED ORDER — SODIUM CHLORIDE 0.9 % IV SOLN
200.0000 mg | Freq: Two times a day (BID) | INTRAVENOUS | Status: DC
  Administered 2016-06-24 – 2016-06-27 (×8): 200 mg via INTRAVENOUS
  Filled 2016-06-24 (×16): qty 20

## 2016-06-24 NOTE — Progress Notes (Signed)
[facility] Attending Consult  Physician Requesting Consult:  Sinda Du, M.D. / [facility]  Date of Consult:  06/23/2016  Reason for Consult/Chief Complaint:  Septic Shock with Seizure  History of Presenting Illness:  History obtained from his outside [place] medical record given his intubated status. 51 y.o. male with known history of traumatic brain injury and seizure disorder as well as underlying COPD. Presented to the emergency department after recurrent seizures. Patient recently was released from jail and had been out of his seizure medications including Topamax and Depakote. While drinking alcohol with friends earlier in the day he was noted to have 2 separate generalized seizure like episodes and EMS was called. Upon arrival they witnessed a third generalized seizure and 2 mg of Ativan was administered. Patient was subsequently transported to the hospital by EMS. Upon further investigation the patient had a seizure approximately 3 weeks prior to presentation as well. Patient was afebrile on presentation saturating normally on room air. In the emergency department he had another seizure with a brief postictal period. He was given his usual dose of oral Topamax. While in the emergency department he had repeated episodes of seizures and was unable to protect his airway thus was intubated by the emergency department physician. Patient was extubated the same day while nurse was performing oral care. He was subsequently transitioned to BiPAP. Patient was evaluated by neurology on 5/2 and had no epileptiform activity on EEG. Patient was placed on maintenance Depakote and Keppra. Per documentation patient remained on BiPAP support and was significantly encephalopathic. Patient was treated with a Precedex infusion while admitted. His level of alertness improved and patient became less combative. On 5/10 the patient developed ventricular tachycardia with a prolonged QT while  on Haldol and with hypokalemia. Haldol was discontinued and patient was given magnesium sulfate. Patient was ultimately transitioned off Precedex with borderline oxygenation. Patient had further seizure activity on 5/11 with subsequent hypoxia requiring endotracheal reintubation on 5/11. He developed increasing secretions likely from aspiration pneumonia and with hypotension responsive to IV fluids was transferred to our facility for further critical care support and assessment by neurology.  Subjective/interval events:    Vent Mode: PRVC FiO2 (%):  [40 %] 40 % Set Rate:  [16 bmp] 16 bmp Vt Set:  [650 mL] 650 mL PEEP:  [5 cmH20] 5 cmH20 Plateau Pressure:  [15 cmH20-20 cmH20] 18 cmH20  Temp:  [98.1 F (36.7 C)-100 F (37.8 C)] 98.8 F (37.1 C) (05/13 0800) Pulse Rate:  [76-107] 98 (05/13 0807) Resp:  [14-22] 16 (05/13 0807) BP: (76-138)/(43-95) 116/69 (05/13 0800) SpO2:  [94 %-100 %] 100 % (05/13 0807) FiO2 (%):  [40 %] 40 % (05/13 0807) Weight:  [92.4 kg (203 lb 11.3 oz)] 92.4 kg (203 lb 11.3 oz) (05/12 1500)  General:  Sedated, intubated, slightly diaphoretic Integument:  No rashes noted Extremities:  No significant edema HEENT:  Oropharynx moist, endotracheal tube in place, pupils equal and reactive Cardiovascular:  Regular, no murmur, no JVD Pulmonary:  Comfortable respiratory pattern, inspiratory crackles on the right, decreased at both bases Abdomen: Soft, benign, positive bowel sounds Musculoskeletal:  No deformities Neurological:  Currently sedated on propofol, some grimace and movement with stimulation, does not follow commands  LINES/TUBES: OETT 05/01 (self-extubated same day); 5/11 >>> RUE DL PICC 5/10 >>> Foley >>> OGT 5/11 >>> PIV  CBC Latest Ref Rng & Units 06/23/2016 06/23/2016 06/22/2016  WBC 4.0 - 10.5 K/uL 23.0(H) 19.1(H) 12.9(H)  Hemoglobin 13.0 -  17.0 g/dL 14.7 13.8 14.6  Hematocrit 39.0 - 52.0 % 41.8 39.4 42.2  Platelets 150 - 400 K/uL 234 238 210     BMP Latest Ref Rng & Units 06/24/2016 06/23/2016 06/23/2016  Glucose 65 - 99 mg/dL 119(H) 119(H) 112(H)  BUN 6 - 20 mg/dL 10 10 11   Creatinine 0.61 - 1.24 mg/dL 0.75 0.84 0.79  Sodium 135 - 145 mmol/L 141 141 141  Potassium 3.5 - 5.1 mmol/L 3.1(L) 2.8(L) 3.1(L)  Chloride 101 - 111 mmol/L 108 107 103  CO2 22 - 32 mmol/L 26 29 27   Calcium 8.9 - 10.3 mg/dL 8.1(L) 8.2(L) 8.7(L)    Hepatic Function Latest Ref Rng & Units 06/24/2016 06/23/2016 06/22/2016  Total Protein 6.5 - 8.1 g/dL - 7.3 7.4  Albumin 3.5 - 5.0 g/dL 2.2(L) 3.0(L) 3.1(L)  AST 15 - 41 U/L - 25 22  ALT 17 - 63 U/L - 15(L) 18  Alk Phosphatase 38 - 126 U/L - 48 54  Total Bilirubin 0.3 - 1.2 mg/dL - 1.1 0.7    IMAGING/STUDIES: UDS 5/1:  Benzodiazepines EEG 5/2:  This recording of the awake and sleep states shows moderate global slowing. However, there is no epileptiform activity is observed. CT HEAD W/O 5/1:  No acute intracranial abnormality. CT HEAD W/O 5/11:  No acute intracranial findings. TTE 5/11:  LV not well visualized. EF 60-65%. Unable to assess LV wall motion or diastolic dysfunction. LA normal in size. RV poorly visualized but appeared grossly normal in size and function. Aortic valve not well visualized but without significant stenosis or regurgitation. Aorta not well visualized. Mitral valve not well visualized but no grossly significant regurgitation or stenosis. Pulmonic valve not well visualized. Tricuspid valve not well visualized but without significant stenosis or regurgitation. No pericardial effusion. PORT CXR 5/12:  Personally reviewed by me. Endotracheal tube in good position. Right upper extremity peripherally inserted central venous catheter in good position. Enteric feeding tube coursing below diaphragm. Slight worsening of patchy opacification within right lower lung. Otherwise, no new findings.  EEG 5/12 >>> PORT CXR 5/12 >>>  MICROBIOLOGY: MRSA PCR 5/1:  Negative HIV 5/1:  Nonreactive  Blood  Cultures x2 5/12 >>> Urine Culture 5/12 >>> Tracheal Aspirate Culture 5/12 >>>  ANTIBIOTICS: Zosyn 5/2 >>> Vancomycin 5/12 >>>  SIGNIFICANT EVENTS: 05/01 - Admit to [facility] with seizures 05/02 - Valproate started 05/04 - Started on Keppra 05/10 - V tach w/ Haldol & Hypokalemia 05/11 - Code Blue w/ seizuing & hypoxia but no cardiac arrest >> reintubated by [facility] EDP 05/12 - Transfer to [facility] w/ re-intubation & septic shock  ASSESSMENT/PLAN:  51 y.o. male with recurrent seizures and borderline hypotension presenting on transfer to our facility for further treatment and evaluation by neurology.  1. Recurrent seizures and history of alcohol abuse, traumatic brain injury. EEG reassuring, no evidence continue seizures: Continue seizure precautions, propofol ordered. Use Ativan IV when necessary for any further seizure activity. Vimpat, Depakote ordered, neurology consulted for dosing recommendations. Appreciate their assistance 2. Acute hypoxic respiratory failure: Secondary to inability to protect airway in the setting of recurrent seizure. PRVC 8 mL/kg at this time, transition to pressure support when sedation lightened and mental status will allow.  3. Probable right side aspiration pneumonia: Respiratory culture pending. Empiric vancomycin and Zosyn ordered. Follow chest x-ray, clinical response, culture data.  4. Sepsis without shock: Likely secondary to aspiration pneumonia. Continue current antibiotics, follow culture data, pro-calcitonin. Lactic acid reassuring 5. Borderline hypotension, resolved: Follow blood pressure, telemetry.  KVO his IV fluids on 5/13 6. Hypokalemia:  Remains low on 5/13, replacing IV 7. Ventricular tachycardia while at outside hospital, suspect medication induced. Continue telemetry monitoring. Hold any medications that will affect the QTc as below. Follow electrolytes closely and replete as indicated. 8. Prolonged QTc, again likely medication induced. Goal magnesium greater  than 2.0, potassium greater than 4.0. Follow telemetry and ECG. Avoid medications that will prolong the QT interval.  9. Alcohol abuse: Continue folate and thiamine, multivitamin. Likely will need to be on Precedex for some period of time once propofol lifted.  10. COPD: No wheezing currently, no evidence of acute exacerbation. Continue scheduled DuoNeb  Prophylaxis:  SCD, Protonix Diet:  NPO. Tube feedings initiated Code Status: Full code Disposition:  Remains critically ill in the ICU.  Family Update:  No family present 5/13  Independent CC time 75 minutes  Baltazar Apo, MD, PhD 06/24/2016, 9:07 AM [facility] [phone] or if no answer [phone]

## 2016-06-24 NOTE — Consult Note (Signed)
Reason for Consult: Recurrent seizures Referring Physician: Dr. [name] is an 51 y.o. male.  HPI: History of post-traumatic epilepsy per records.  He apparently was jailed recently and did not get his AEDs.  He then went on alcohol drinking binge with friends after release and was found to have recurrent seizures requiring intubation.  He was given multiple rounds of Ativan and then loaded with Keppra.  He has remained seizure-free for past 24 hours.  His EEG showed mainly sleep pattern.  CT Brain is normal.   He has a history of violent behavior and aggressive behavior.  They have had to sedate him with Propofol too.    Past Medical History:  Diagnosis Date  . Arthritis   . Asthma   . Chronic neck pain   . COPD (chronic obstructive pulmonary disease) ([place])   . Injury of jawline   . Mental disorder   . Ruptured disc, cervical   . Seizures ([place])   . Torn rotator cuff     History reviewed. No pertinent surgical history.  Family History  Problem Relation Age of Onset  . Heart failure Father   . Cancer Father   . Alcoholism Brother     Social History:  reports that he has been smoking Cigarettes and Cigars.  He has never used smokeless tobacco. He reports that he does not drink alcohol or use drugs.  Allergies:  Allergies  Allergen Reactions  . Oxycodone-Acetaminophen     Severe rash and itching  . Oxycodone Hcl     itching  . Percocet [Oxycodone-Acetaminophen] Other (See Comments)    itching    Prior to Admission medications   Medication Sig Start Date End Date Taking? Authorizing Provider  albuterol (PROVENTIL HFA;VENTOLIN HFA) 108 (90 BASE) MCG/ACT inhaler Inhale 2 puffs into the lungs every 6 (six) hours as needed. For shortness of breath   Yes [provider]  ALPRAZolam (XANAX) 1 MG tablet Take 1 mg by mouth 3 (three) times daily as needed for anxiety.   Yes [provider]  oxyCODONE-acetaminophen (PERCOCET) 10-325 MG tablet Take 1  tablet by mouth 4 (four) times daily as needed for pain.   Yes [provider]    Medications:  Scheduled: . chlorhexidine gluconate (MEDLINE KIT)  15 mL Mouth Rinse BID  . Chlorhexidine Gluconate Cloth  6 each Topical Daily  . folic acid  1 mg Intravenous Daily  . insulin aspart  0-9 Units Subcutaneous Q4H  . ipratropium-albuterol  3 mL Nebulization TID  . mouth rinse  15 mL Mouth Rinse QID  . multivitamin  15 mL Per Tube Daily  . pantoprazole sodium  40 mg Per Tube Daily  . thiamine  100 mg Intravenous Daily    Results for orders placed or performed during the hospital encounter of 06/12/16 (from the past 48 hour(s))  Glucose, capillary     Status: Abnormal   Collection Time: 06/22/16 11:09 AM  Result Value Ref Range   Glucose-Capillary 127 (H) 65 - 99 mg/dL   Comment 1 Notify RN    Comment 2 Document in Chart   Troponin I     Status: Abnormal   Collection Time: 06/22/16 11:25 AM  Result Value Ref Range   Troponin I 0.05 (HH) <0.03 ng/mL    Comment: CRITICAL VALUE NOTED.  VALUE IS CONSISTENT WITH PREVIOUSLY REPORTED AND CALLED VALUE.  Comprehensive metabolic panel     Status: Abnormal   Collection Time: 06/22/16 11:25 AM  Result Value Ref Range   Sodium 141 135 - 145 mmol/L   Potassium 3.8 3.5 - 5.1 mmol/L    Comment: DELTA CHECK NOTED   Chloride 102 101 - 111 mmol/L   CO2 29 22 - 32 mmol/L   Glucose, Bld 155 (H) 65 - 99 mg/dL   BUN 5 (L) 6 - 20 mg/dL   Creatinine, Ser 0.59 (L) 0.61 - 1.24 mg/dL   Calcium 8.2 (L) 8.9 - 10.3 mg/dL   Total Protein 7.4 6.5 - 8.1 g/dL   Albumin 3.1 (L) 3.5 - 5.0 g/dL   AST 22 15 - 41 U/L   ALT 18 17 - 63 U/L   Alkaline Phosphatase 54 38 - 126 U/L   Total Bilirubin 0.7 0.3 - 1.2 mg/dL   GFR calc non Af Amer >60 >60 mL/min   GFR calc Af Amer >60 >60 mL/min    Comment: (NOTE) The eGFR has been calculated using the CKD EPI equation. This calculation has not been validated in all clinical situations. eGFR's persistently <60 mL/min  signify possible Chronic Kidney Disease.    Anion gap 10 5 - 15  CBC with Differential/Platelet     Status: Abnormal   Collection Time: 06/22/16 11:25 AM  Result Value Ref Range   WBC 12.9 (H) 4.0 - 10.5 K/uL   RBC 4.53 4.22 - 5.81 MIL/uL   Hemoglobin 14.6 13.0 - 17.0 g/dL   HCT 42.2 39.0 - 52.0 %   MCV 93.2 78.0 - 100.0 fL   MCH 32.2 26.0 - 34.0 pg   MCHC 34.6 30.0 - 36.0 g/dL   RDW 12.3 11.5 - 15.5 %   Platelets 210 150 - 400 K/uL   Neutrophils Relative % 79 %   Neutro Abs 10.1 (H) 1.7 - 7.7 K/uL   Lymphocytes Relative 11 %   Lymphs Abs 1.5 0.7 - 4.0 K/uL   Monocytes Relative 9 %   Monocytes Absolute 1.2 (H) 0.1 - 1.0 K/uL   Eosinophils Relative 1 %   Eosinophils Absolute 0.1 0.0 - 0.7 K/uL   Basophils Relative 0 %   Basophils Absolute 0.0 0.0 - 0.1 K/uL  Valproic acid level     Status: None   Collection Time: 06/22/16 11:25 AM  Result Value Ref Range   Valproic Acid Lvl 54 50.0 - 100.0 ug/mL  Triglycerides     Status: None   Collection Time: 06/22/16 11:30 AM  Result Value Ref Range   Triglycerides 133 <150 mg/dL  Draw ABG 1 hour after initiation of ventilator     Status: Abnormal   Collection Time: 06/22/16 12:25 PM  Result Value Ref Range   FIO2 100.00    Delivery systems VENTILATOR    Mode PRESSURE REGULATED VOLUME CONTROL    VT 660 mL   LHR 16 resp/min   Peep/cpap 5.0 cm H20   pH, Arterial 7.394 7.350 - 7.450   pCO2 arterial 43.6 32.0 - 48.0 mmHg   pO2, Arterial 64.8 (L) 83.0 - 108.0 mmHg   Bicarbonate 25.4 20.0 - 28.0 mmol/L   Acid-Base Excess 1.6 0.0 - 2.0 mmol/L   O2 Saturation 91.2 %   Patient temperature 37.0    Collection site LEFT BRACHIAL    Drawn by [id number]    Sample type ARTERIAL DRAW   Glucose, capillary     Status: None   Collection Time: 06/22/16  4:26 PM  Result Value Ref Range   Glucose-Capillary 76 65 - 99 mg/dL   Comment 1 Notify  RN    Comment 2 Document in Chart   Glucose, capillary     Status: Abnormal   Collection Time: 06/22/16   8:20 PM  Result Value Ref Range   Glucose-Capillary 101 (H) 65 - 99 mg/dL   Comment 1 Notify RN    Comment 2 Document in Chart   Glucose, capillary     Status: Abnormal   Collection Time: 06/22/16 11:28 PM  Result Value Ref Range   Glucose-Capillary 124 (H) 65 - 99 mg/dL   Comment 1 Notify RN    Comment 2 Document in Chart   CBC     Status: Abnormal   Collection Time: 06/23/16  2:11 AM  Result Value Ref Range   WBC 19.1 (H) 4.0 - 10.5 K/uL   RBC 4.24 4.22 - 5.81 MIL/uL   Hemoglobin 13.8 13.0 - 17.0 g/dL   HCT 39.4 39.0 - 52.0 %   MCV 92.9 78.0 - 100.0 fL   MCH 32.5 26.0 - 34.0 pg   MCHC 35.0 30.0 - 36.0 g/dL   RDW 12.6 11.5 - 15.5 %   Platelets 238 150 - 400 K/uL  Culture, blood (Routine X 2) w Reflex to ID Panel     Status: None (Preliminary result)   Collection Time: 06/23/16  2:11 AM  Result Value Ref Range   Specimen Description A-LINE    Special Requests      BOTTLES DRAWN AEROBIC AND ANAEROBIC Blood Culture results may not be optimal due to an inadequate volume of blood received in culture bottles DRAWN BY RN   Culture NO GROWTH < 12 HOURS    Report Status PENDING   Culture, blood (Routine X 2) w Reflex to ID Panel     Status: None (Preliminary result)   Collection Time: 06/23/16  2:11 AM  Result Value Ref Range   Specimen Description LEFT ANTECUBITAL    Special Requests      BOTTLES DRAWN AEROBIC AND ANAEROBIC Blood Culture results may not be optimal due to an inadequate volume of blood received in culture bottles   Culture NO GROWTH < 12 HOURS    Report Status PENDING   Blood gas, arterial     Status: Abnormal   Collection Time: 06/23/16  4:25 AM  Result Value Ref Range   FIO2 40.00    Delivery systems PRESSURE REGULATED VOLUME CONTROL    Mode VENTILATOR    VT 660 mL   LHR 16 resp/min   Peep/cpap 5.0 cm H20   pH, Arterial 7.447 7.350 - 7.450   pCO2 arterial 39.4 32.0 - 48.0 mmHg   pO2, Arterial 112.0 (H) 83.0 - 108.0 mmHg   Bicarbonate 27.2 20.0 - 28.0  mmol/L   Acid-Base Excess 2.8 (H) 0.0 - 2.0 mmol/L   O2 Saturation 97.6 %   Patient temperature 38.9    Collection site BRACHIAL ARTERY    Drawn by [id number]    Sample type ARTERIAL    Allens test (pass/fail) NOT INDICATED (A) PASS  Glucose, capillary     Status: None   Collection Time: 06/23/16  4:53 AM  Result Value Ref Range   Glucose-Capillary 92 65 - 99 mg/dL   Comment 1 Notify RN    Comment 2 Document in Chart   Comprehensive metabolic panel     Status: Abnormal   Collection Time: 06/23/16  5:15 AM  Result Value Ref Range   Sodium 141 135 - 145 mmol/L   Potassium 3.1 (L) 3.5 - 5.1 mmol/L  Comment: DELTA CHECK NOTED   Chloride 103 101 - 111 mmol/L   CO2 27 22 - 32 mmol/L   Glucose, Bld 112 (H) 65 - 99 mg/dL   BUN 11 6 - 20 mg/dL   Creatinine, Ser 0.79 0.61 - 1.24 mg/dL   Calcium 8.7 (L) 8.9 - 10.3 mg/dL   Total Protein 7.3 6.5 - 8.1 g/dL   Albumin 3.0 (L) 3.5 - 5.0 g/dL   AST 25 15 - 41 U/L   ALT 15 (L) 17 - 63 U/L   Alkaline Phosphatase 48 38 - 126 U/L   Total Bilirubin 1.1 0.3 - 1.2 mg/dL   GFR calc non Af Amer >60 >60 mL/min   GFR calc Af Amer >60 >60 mL/min    Comment: (NOTE) The eGFR has been calculated using the CKD EPI equation. This calculation has not been validated in all clinical situations. eGFR's persistently <60 mL/min signify possible Chronic Kidney Disease.    Anion gap 11 5 - 15  CBC with Differential/Platelet     Status: Abnormal   Collection Time: 06/23/16  5:15 AM  Result Value Ref Range   WBC 23.0 (H) 4.0 - 10.5 K/uL   RBC 4.46 4.22 - 5.81 MIL/uL   Hemoglobin 14.7 13.0 - 17.0 g/dL   HCT 41.8 39.0 - 52.0 %   MCV 93.7 78.0 - 100.0 fL   MCH 33.0 26.0 - 34.0 pg   MCHC 35.2 30.0 - 36.0 g/dL   RDW 12.5 11.5 - 15.5 %   Platelets 234 150 - 400 K/uL   Neutrophils Relative % 85 %   Neutro Abs 19.4 (H) 1.7 - 7.7 K/uL   Lymphocytes Relative 6 %   Lymphs Abs 1.5 0.7 - 4.0 K/uL   Monocytes Relative 9 %   Monocytes Absolute 2.1 (H) 0.1 - 1.0 K/uL    Eosinophils Relative 0 %   Eosinophils Absolute 0.0 0.0 - 0.7 K/uL   Basophils Relative 0 %   Basophils Absolute 0.0 0.0 - 0.1 K/uL  Glucose, capillary     Status: Abnormal   Collection Time: 06/23/16  7:28 AM  Result Value Ref Range   Glucose-Capillary 105 (H) 65 - 99 mg/dL  Procalcitonin - Baseline     Status: None   Collection Time: 06/23/16 12:18 PM  Result Value Ref Range   Procalcitonin 0.11 ng/mL    Comment:        Interpretation: PCT (Procalcitonin) <= 0.5 ng/mL: Systemic infection (sepsis) is not likely. Local bacterial infection is possible. (NOTE)         ICU PCT Algorithm               Non ICU PCT Algorithm    ----------------------------     ------------------------------         PCT < 0.25 ng/mL                 PCT < 0.1 ng/mL     Stopping of antibiotics            Stopping of antibiotics       strongly encouraged.               strongly encouraged.    ----------------------------     ------------------------------       PCT level decrease by               PCT < 0.25 ng/mL       >= 80% from peak PCT  OR PCT 0.25 - 0.5 ng/mL          Stopping of antibiotics                                             encouraged.     Stopping of antibiotics           encouraged.    ----------------------------     ------------------------------       PCT level decrease by              PCT >= 0.25 ng/mL       < 80% from peak PCT        AND PCT >= 0.5 ng/mL            Continuin g antibiotics                                              encouraged.       Continuing antibiotics            encouraged.    ----------------------------     ------------------------------     PCT level increase compared          PCT > 0.5 ng/mL         with peak PCT AND          PCT >= 0.5 ng/mL             Escalation of antibiotics                                          strongly encouraged.      Escalation of antibiotics        strongly encouraged.   Urinalysis, Routine w reflex microscopic      Status: Abnormal   Collection Time: 06/23/16  1:09 PM  Result Value Ref Range   Color, Urine AMBER (A) YELLOW    Comment: BIOCHEMICALS MAY BE AFFECTED BY COLOR   APPearance HAZY (A) CLEAR   Specific Gravity, Urine 1.026 1.005 - 1.030   pH 5.0 5.0 - 8.0   Glucose, UA NEGATIVE NEGATIVE mg/dL   Hgb urine dipstick NEGATIVE NEGATIVE   Bilirubin Urine NEGATIVE NEGATIVE   Ketones, ur 5 (A) NEGATIVE mg/dL   Protein, ur 30 (A) NEGATIVE mg/dL   Nitrite NEGATIVE NEGATIVE   Leukocytes, UA NEGATIVE NEGATIVE   RBC / HPF 6-30 0 - 5 RBC/hpf   WBC, UA NONE SEEN 0 - 5 WBC/hpf   Bacteria, UA NONE SEEN NONE SEEN   Squamous Epithelial / LPF NONE SEEN NONE SEEN   Mucous PRESENT   Glucose, capillary     Status: None   Collection Time: 06/23/16  2:09 PM  Result Value Ref Range   Glucose-Capillary 84 65 - 99 mg/dL  Lactic acid, plasma     Status: None   Collection Time: 06/23/16  2:24 PM  Result Value Ref Range   Lactic Acid, Venous 1.0 0.5 - 1.9 mmol/L  Glucose, capillary     Status: None   Collection Time: 06/23/16  3:47 PM  Result Value Ref Range   Glucose-Capillary 96 65 - 99 mg/dL  Culture, respiratory (NON-Expectorated)     Status: None (Preliminary result)   Collection Time: 06/23/16  4:28 PM  Result Value Ref Range   Specimen Description TRACHEAL ASPIRATE    Special Requests Normal    Gram Stain      MODERATE WBC PRESENT, PREDOMINANTLY PMN RARE RARE GRAM POSITIVE COCCI IN PAIRS IN CLUSTERS    Culture PENDING    Report Status PENDING   Glucose, capillary     Status: Abnormal   Collection Time: 06/23/16  7:47 PM  Result Value Ref Range   Glucose-Capillary 103 (H) 65 - 99 mg/dL  Magnesium     Status: None   Collection Time: 06/23/16 11:00 PM  Result Value Ref Range   Magnesium 2.1 1.7 - 2.4 mg/dL  Basic metabolic panel     Status: Abnormal   Collection Time: 06/23/16 11:00 PM  Result Value Ref Range   Sodium 141 135 - 145 mmol/L   Potassium 2.8 (L) 3.5 - 5.1 mmol/L   Chloride 107  101 - 111 mmol/L   CO2 29 22 - 32 mmol/L   Glucose, Bld 119 (H) 65 - 99 mg/dL   BUN 10 6 - 20 mg/dL   Creatinine, Ser 0.84 0.61 - 1.24 mg/dL   Calcium 8.2 (L) 8.9 - 10.3 mg/dL   GFR calc non Af Amer >60 >60 mL/min   GFR calc Af Amer >60 >60 mL/min    Comment: (NOTE) The eGFR has been calculated using the CKD EPI equation. This calculation has not been validated in all clinical situations. eGFR's persistently <60 mL/min signify possible Chronic Kidney Disease.    Anion gap 5 5 - 15  Lactic acid, plasma     Status: None   Collection Time: 06/23/16 11:00 PM  Result Value Ref Range   Lactic Acid, Venous 0.9 0.5 - 1.9 mmol/L  Glucose, capillary     Status: Abnormal   Collection Time: 06/23/16 11:17 PM  Result Value Ref Range   Glucose-Capillary 101 (H) 65 - 99 mg/dL  Glucose, capillary     Status: Abnormal   Collection Time: 06/24/16  3:17 AM  Result Value Ref Range   Glucose-Capillary 132 (H) 65 - 99 mg/dL  Blood gas, arterial     Status: Abnormal   Collection Time: 06/24/16  4:30 AM  Result Value Ref Range   FIO2 40.00    Delivery systems VENTILATOR    Mode PRESSURE REGULATED VOLUME CONTROL    VT 650 mL   LHR 16 resp/min   Peep/cpap 5.0 cm H20   pH, Arterial 7.521 (H) 7.350 - 7.450   pCO2 arterial 32.7 32.0 - 48.0 mmHg   pO2, Arterial 75.3 (L) 83.0 - 108.0 mmHg   Bicarbonate 27.0 20.0 - 28.0 mmol/L   Acid-Base Excess 3.8 (H) 0.0 - 2.0 mmol/L   O2 Saturation 96.3 %   Patient temperature 96.8    Collection site RIGHT RADIAL    Drawn by [phone]    Sample type ARTERIAL DRAW    Allens test (pass/fail) PASS PASS  Lactic acid, plasma     Status: None   Collection Time: 06/24/16  5:49 AM  Result Value Ref Range   Lactic Acid, Venous 0.7 0.5 - 1.9 mmol/L  Magnesium     Status: None   Collection Time: 06/24/16  5:49 AM  Result Value Ref Range   Magnesium 1.9 1.7 - 2.4 mg/dL  Renal function panel     Status: Abnormal   Collection Time: 06/24/16  5:49  AM  Result Value Ref  Range   Sodium 141 135 - 145 mmol/L   Potassium 3.1 (L) 3.5 - 5.1 mmol/L   Chloride 108 101 - 111 mmol/L   CO2 26 22 - 32 mmol/L   Glucose, Bld 119 (H) 65 - 99 mg/dL   BUN 10 6 - 20 mg/dL   Creatinine, Ser 0.75 0.61 - 1.24 mg/dL   Calcium 8.1 (L) 8.9 - 10.3 mg/dL   Phosphorus 2.0 (L) 2.5 - 4.6 mg/dL   Albumin 2.2 (L) 3.5 - 5.0 g/dL   GFR calc non Af Amer >60 >60 mL/min   GFR calc Af Amer >60 >60 mL/min    Comment: (NOTE) The eGFR has been calculated using the CKD EPI equation. This calculation has not been validated in all clinical situations. eGFR's persistently <60 mL/min signify possible Chronic Kidney Disease.    Anion gap 7 5 - 15  Glucose, capillary     Status: Abnormal   Collection Time: 06/24/16  7:36 AM  Result Value Ref Range   Glucose-Capillary 107 (H) 65 - 99 mg/dL    Ct Head Wo Contrast  Result Date: 06/22/2016 CLINICAL DATA:  Code fixation. Hypoxia. Recent aspiration pneumonia. History of traumatic brain injury. EXAM: CT HEAD WITHOUT CONTRAST TECHNIQUE: Contiguous axial images were obtained from the base of the skull through the vertex without intravenous contrast. COMPARISON:  06/12/2016 and 05/27/2011 FINDINGS: Brain: Ventricles, cisterns and other CSF spaces are normal. There is no mass, mass effect, shift of midline structures or acute hemorrhage. No evidence of acute infarction. Vascular: Within normal. Skull: Within normal. Sinuses/Orbits: Within normal. Other: None. IMPRESSION: No acute intracranial findings. Electronically Signed   By: Marin Olp M.D.   On: 06/22/2016 15:15   Dg Chest Port 1 View  Result Date: 06/24/2016 CLINICAL DATA:  Acute respiratory failure EXAM: PORTABLE CHEST 1 VIEW COMPARISON:  Jun 23, 2016 FINDINGS: The ETT terminates 2.2 cm above the carina. Other support apparatus is stable within visualize limits. Increasing opacity in the medial right lung base. No other change. IMPRESSION: 1. Support apparatus as above. 2. Increasing opacity in  the medial right lung base. Developing pneumonia not excluded. Recommend clinical correlation and attention on follow-up. Electronically Signed   By: Dorise Bullion III M.D   On: 06/24/2016 07:40   Dg Chest Port 1 View  Result Date: 06/23/2016 CLINICAL DATA:  Acute respiratory failure with hypoxia. ETT present. Hx of asthma, COPD, seizures. EXAM: PORTABLE CHEST 1 VIEW COMPARISON:  Chest x-rays dated 06/23/2016 and 06/22/2016. FINDINGS: Endotracheal tube remains well positioned with tip approximately 3 cm above the carina. Nasogastric tube passes below the diaphragm. Right-sided PICC line is well positioned with tip at the level of the lower SVC/cavoatrial junction. Heart size is normal. Mild central pulmonary vascular congestion without overt alveolar pulmonary edema. No evidence of pneumonia. No pleural effusion or pneumothorax seen. IMPRESSION: 1. Support apparatus appears appropriately positioned. 2. Mild central pulmonary vascular congestion without overt alveolar pulmonary edema. No evidence of pneumonia. No pleural effusion or pneumothorax seen. Electronically Signed   By: Franki Cabot M.D.   On: 06/23/2016 13:08   Dg Chest Port 1 View  Result Date: 06/23/2016 CLINICAL DATA:  Respiratory failure EXAM: PORTABLE CHEST 1 VIEW COMPARISON:  Jun 22, 2016 FINDINGS: Support apparatus is in good position. No pneumothorax. The cardiomediastinal silhouette is normal. No acute pulmonary opacities identified. IMPRESSION: Stable support apparatus. Electronically Signed   By: Dorise Bullion III M.D   On: 06/23/2016 08:51  Dg Chest Port 1 View  Result Date: 06/22/2016 CLINICAL DATA:  Encounter for intubation. EXAM: PORTABLE CHEST 1 VIEW COMPARISON:  Radiograph earlier this day at 1129 hour FINDINGS: Endotracheal tube 4.3 cm from the carina. Enteric tube in place, tip below the diaphragm not included in the field of view. Right upper extremity PICC tip in the region of the distal SVC. Volume loss in the right  lung base appears new. Improved perihilar opacities from prior exam. Unchanged heart size and mediastinal contours. No evidence of pneumothorax. IMPRESSION: 1. Endotracheal tube 4.3 cm from the carina. Enteric tube and right upper extremity PICC in place. 2. Volume loss at the right lung base concerning for partial lobar atelectasis/collapse. 3. Otherwise improving perihilar opacities likely improving edema. Electronically Signed   By: Jeb Levering M.D.   On: 06/22/2016 20:50   Dg Chest Port 1 View  Result Date: 06/22/2016 CLINICAL DATA:  Intubation EXAM: PORTABLE CHEST 1 VIEW COMPARISON:  06/13/2016 FINDINGS: Endotracheal tube is 6 cm above the carina. NG tube is in the stomach. Right PICC line is in place with the tip near the cavoatrial junction. Mild perihilar airspace opacities are noted, left greater than right. Heart is normal size. No effusions. IMPRESSION: Endotracheal tube 6 cm above the carina. Mild perihilar airspace opacities, left greater than right, likely mild edema. Electronically Signed   By: Rolm Baptise M.D.   On: 06/22/2016 11:44    ROS Blood pressure 100/66, pulse 95, temperature 99.1 F (37.3 C), temperature source Core (Comment), resp. rate 16, height _0  (1.88 m), weight 92.4 kg (203 lb 11.3 oz), SpO2 100 %. Neurologic Examination:  Intubated. Heavily sedated.  Examined off Propofol for 5 minutes, he opens eyes slightly and moves head around, but does not follow commands.   Mild withdrawal to pain in all 4 extremities.   Assessment/Plan:  Post-traumatic epilepsy.  Currently well controlled.  Given his aggressive and violent behaviors recently, Keppra may not be the best choice for him at this time.  I will switch it to Vimpat 200 mg bid.  He can continue the Depakote, which is also a mood stabilizer.  I would keep him intubated for another 24 hours while the transition from one AED to another occurs in anticipation of any breakthrough seizures.  However, minimize the  sedation as much as possible.  Vimpat is less sedating than Keppra which should also help him wake up more.    Clifford Jury, MD 06/24/2016, 8:12 AM

## 2016-06-24 NOTE — Progress Notes (Signed)
eLink Physician-Brief Progress Note Patient Name: Clifford LocusSteven L Morris DOB: 1965-03-31 MRN: [id number]   Date of Service  06/24/2016  HPI/Events of Note  Hypokalemia  eICU Interventions  Potassium replaced     Intervention Category Intermediate Interventions: Electrolyte abnormality - evaluation and management  [name] 06/24/2016, 12:16 AM

## 2016-06-24 NOTE — Progress Notes (Signed)
  Dr. [name]'s note from yesterday reviewed.  I reviewed tele today. He had brief (5-beat) run of SVT and rare PVCs. Otherwise rhythm completely stable.   We will sign off. Please call with questions.   [name], [name], MD  10:34 AM

## 2016-06-25 ENCOUNTER — Inpatient Hospital Stay (HOSPITAL_COMMUNITY): Payer: Medicaid Other

## 2016-06-25 DIAGNOSIS — G9340 Encephalopathy, unspecified: Secondary | ICD-10-CM

## 2016-06-25 DIAGNOSIS — S069X9S Unspecified intracranial injury with loss of consciousness of unspecified duration, sequela: Secondary | ICD-10-CM

## 2016-06-25 DIAGNOSIS — J9601 Acute respiratory failure with hypoxia: Secondary | ICD-10-CM

## 2016-06-25 LAB — CBC WITH DIFFERENTIAL/PLATELET
BASOS PCT: 0 %
Basophils Absolute: 0 10*3/uL (ref 0.0–0.1)
EOS ABS: 0.2 10*3/uL (ref 0.0–0.7)
Eosinophils Relative: 2 %
HEMATOCRIT: 35.4 % — AB (ref 39.0–52.0)
Hemoglobin: 11.9 g/dL — ABNORMAL LOW (ref 13.0–17.0)
LYMPHS ABS: 1.8 10*3/uL (ref 0.7–4.0)
Lymphocytes Relative: 16 %
MCH: 31.6 pg (ref 26.0–34.0)
MCHC: 33.6 g/dL (ref 30.0–36.0)
MCV: 94.1 fL (ref 78.0–100.0)
MONO ABS: 1.2 10*3/uL — AB (ref 0.1–1.0)
MONOS PCT: 11 %
NEUTROS ABS: 8 10*3/uL — AB (ref 1.7–7.7)
Neutrophils Relative %: 71 %
Platelets: 280 10*3/uL (ref 150–400)
RBC: 3.76 MIL/uL — ABNORMAL LOW (ref 4.22–5.81)
RDW: 12.9 % (ref 11.5–15.5)
WBC: 11.2 10*3/uL — ABNORMAL HIGH (ref 4.0–10.5)

## 2016-06-25 LAB — MAGNESIUM: Magnesium: 2.1 mg/dL (ref 1.7–2.4)

## 2016-06-25 LAB — TRIGLYCERIDES: TRIGLYCERIDES: 101 mg/dL (ref <150)

## 2016-06-25 LAB — GLUCOSE, CAPILLARY
GLUCOSE-CAPILLARY: 102 mg/dL — AB (ref 65–99)
Glucose-Capillary: 99 mg/dL (ref 65–99)
Glucose-Capillary: 122 mg/dL — ABNORMAL HIGH (ref 65–99)

## 2016-06-25 LAB — RENAL FUNCTION PANEL
Albumin: 2.4 g/dL — ABNORMAL LOW (ref 3.5–5.0)
Anion gap: 8 (ref 5–15)
BUN: 8 mg/dL (ref 6–20)
CHLORIDE: 107 mmol/L (ref 101–111)
CO2: 30 mmol/L (ref 22–32)
CREATININE: 0.74 mg/dL (ref 0.61–1.24)
Calcium: 8.5 mg/dL — ABNORMAL LOW (ref 8.9–10.3)
GFR calc Af Amer: >60 mL/min (ref >60)
GFR calc non Af Amer: >60 mL/min (ref >60)
Glucose, Bld: 126 mg/dL — ABNORMAL HIGH (ref 65–99)
Phosphorus: 2.9 mg/dL (ref 2.5–4.6)
Potassium: 3 mmol/L — ABNORMAL LOW (ref 3.5–5.1)
Sodium: 145 mmol/L (ref 135–145)

## 2016-06-25 LAB — PROCALCITONIN: Procalcitonin: <0.1 ng/mL

## 2016-06-25 MED ORDER — FOLIC ACID 1 MG PO TABS
1.0000 mg | ORAL_TABLET | Freq: Every day | ORAL | Status: DC
  Administered 2016-06-25 – 2016-06-29 (×5): 1 mg
  Filled 2016-06-25 (×5): qty 1

## 2016-06-25 MED ORDER — POTASSIUM CHLORIDE 20 MEQ/15ML (10%) PO SOLN
30.0000 meq | ORAL | Status: AC
  Administered 2016-06-25 (×2): 30 meq
  Filled 2016-06-25 (×2): qty 30

## 2016-06-25 MED ORDER — VITAMIN B-1 100 MG PO TABS
100.0000 mg | ORAL_TABLET | Freq: Every day | ORAL | Status: DC
  Administered 2016-06-25 – 2016-06-29 (×5): 100 mg
  Filled 2016-06-25 (×5): qty 1

## 2016-06-25 MED ORDER — ENOXAPARIN SODIUM 40 MG/0.4ML ~~LOC~~ SOLN
40.0000 mg | SUBCUTANEOUS | Status: DC
  Administered 2016-06-25 – 2016-07-01 (×7): 40 mg via SUBCUTANEOUS
  Filled 2016-06-25 (×7): qty 0.4

## 2016-06-25 MED FILL — Medication: Qty: 1 | Status: AC

## 2016-06-25 NOTE — Progress Notes (Signed)
PCCM Progress Note  Admission date: 06/12/2016 Consult date: 06/23/2016 Referring provider: Dr. Juanetta GoslingHawkins, [facility]  CC: Seizure  HPI: 51 [facility] after being released from jail.  Uncertain if he was taking topamax, depakote.  He had alcohol binge and developed generalized seizure.  He required intubation for airway protection.  He was extubated.  He developed progressive confusion and agitation, and required reintubation for airway protection after developing VT from haldol.  Subjective: On pressure support.  Vital signs: BP 128/73   Pulse (!) 101   Temp 97.9 F (36.6 C)   Resp (!) 25   Ht 6\' 2"  (1.88 m)   Wt 211 lb 13.8 oz (96.1 kg)   SpO2 99%   BMI 27.20 kg/m   Intake/output: I/O last 3 completed shifts: In: 6684.1 [I.V.:2474.9; NG/GT:2234.2; IV Piggyback:1975] Out: 3060 [Urine:3060]  General: not following commands Neuro: grimaces with stimulation HEENT: ETT in place, pupils reactive Cardiac: regular, tachycardic Chest: basilar raled Rt > Lt Abd: soft, non tender Ext: no edema Skin: no rashes   CMP Latest Ref Rng & Units 06/25/2016 06/24/2016 06/23/2016  Glucose 65 - 99 mg/dL 960(A126(H) 540(J119(H) 811(B119(H)  BUN 6 - 20 mg/dL 8 10 10   Creatinine 0.61 - 1.24 mg/dL 1.470.74 8.290.75 5.620.84  Sodium 135 - 145 mmol/L 145 141 141  Potassium 3.5 - 5.1 mmol/L 3.0(L) 3.1(L) 2.8(L)  Chloride 101 - 111 mmol/L 107 108 107  CO2 22 - 32 mmol/L 30 26 29   Calcium 8.9 - 10.3 mg/dL 1.3(Y8.5(L) 8.1(L) 8.2(L)  Total Protein 6.5 - 8.1 g/dL - - -  Total Bilirubin 0.3 - 1.2 mg/dL - - -  Alkaline Phos 38 - 126 U/L - - -  AST 15 - 41 U/L - - -  ALT 17 - 63 U/L - - -     CBC Latest Ref Rng & Units 06/25/2016 06/24/2016 06/23/2016  WBC 4.0 - 10.5 K/uL 11.2(H) 11.3(H) 23.0(H)  Hemoglobin 13.0 - 17.0 g/dL 11.9(L) 10.7(L) 14.7  Hematocrit 39.0 - 52.0 % 35.4(L) 32.1(L) 41.8  Platelets 150 - 400 K/uL 280 181 234     ABG    Component Value Date/Time   PHART 7.521 (H) 06/24/2016  0430   PCO2ART 32.7 06/24/2016 0430   PO2ART 75.3 (L) 06/24/2016 0430   HCO3 27.0 06/24/2016 0430   TCO2 28 06/12/2016 1218   ACIDBASEDEF 1.9 06/12/2016 1505   O2SAT 96.3 06/24/2016 0430     CBG (last 3)   Recent Labs  06/24/16 2350 06/25/16 0439 06/25/16 0721  GLUCAP 125* 99 102*     Imaging: Dg Chest Port 1 View  Result Date: 06/25/2016 CLINICAL DATA:  Respiratory failure. EXAM: PORTABLE CHEST 1 VIEW COMPARISON:  06/24/2016 . FINDINGS: Endotracheal tube, NG tube, right PICC line in stable position. Heart size normal. Persistent infiltrate right lung base without interim change. No pleural effusion or pneumothorax. IMPRESSION: 1. Lines and tubes in stable position. 2. Persistent infiltrate right lung base.  No interim change. Electronically Signed   By: Maisie Fushomas  Register   On: 06/25/2016 06:54   Dg Chest Port 1 View  Result Date: 06/24/2016 CLINICAL DATA:  Acute respiratory failure EXAM: PORTABLE CHEST 1 VIEW COMPARISON:  Jun 23, 2016 FINDINGS: The ETT terminates 2.2 cm above the carina. Other support apparatus is stable within visualize limits. Increasing opacity in the medial right lung base. No other change. IMPRESSION: 1. Support apparatus as above. 2. Increasing opacity in the medial right lung base. Developing pneumonia  not excluded. Recommend clinical correlation and attention on follow-up. Electronically Signed   By: Gerome Sam III M.D   On: 06/24/2016 07:40   Dg Chest Port 1 View  Result Date: 06/23/2016 CLINICAL DATA:  Acute respiratory failure with hypoxia. ETT present. Hx of asthma, COPD, seizures. EXAM: PORTABLE CHEST 1 VIEW COMPARISON:  Chest x-rays dated 06/23/2016 and 06/22/2016. FINDINGS: Endotracheal tube remains well positioned with tip approximately 3 cm above the carina. Nasogastric tube passes below the diaphragm. Right-sided PICC line is well positioned with tip at the level of the lower SVC/cavoatrial junction. Heart size is normal. Mild central pulmonary  vascular congestion without overt alveolar pulmonary edema. No evidence of pneumonia. No pleural effusion or pneumothorax seen. IMPRESSION: 1. Support apparatus appears appropriately positioned. 2. Mild central pulmonary vascular congestion without overt alveolar pulmonary edema. No evidence of pneumonia. No pleural effusion or pneumothorax seen. Electronically Signed   By: Bary Richard M.D.   On: 06/23/2016 13:08     Studies: UDS 5/1:  Benzodiazepines EEG 5/2:  This recording of the awake and sleep states shows moderate global slowing. However, there is no epileptiform activity is observed. CT HEAD W/O 5/1:  No acute intracranial abnormality. CT HEAD W/O 5/11:  No acute intracranial findings. TTE 5/11:  LV not well visualized. EF 60-65%. Unable to assess LV wall motion or diastolic dysfunction. LA normal in size. RV poorly visualized but appeared grossly normal in size and function. Aortic valve not well visualized but without significant stenosis or regurgitation. Aorta not well visualized. Mitral valve not well visualized but no grossly significant regurgitation or stenosis. Pulmonic valve not well visualized. Tricuspid valve not well visualized but without significant stenosis or regurgitation. No pericardial effusion.  EEG 5/12 >>>  Antibiotics: Zosyn 5/2 >>> Vancomycin 5/12 >>>  Cultures: MRSA PCR 5/1:  Negative HIV 5/1:  Nonreactive  Blood Cultures x2 5/12 >>> Urine Culture 5/12 >>> negative Tracheal Aspirate Culture 5/12 >>> Staph aureus >>>  Lines/tubes: ETT 5/01 >> 5/01 ETT 5/11 >> Rt arm PICC 5/10 >>  Events: 05/01 - Admit to [facility] with seizures 05/02 - Valproate started 05/04 - Started on Keppra 05/10 - V tach w/ Haldol & Hypokalemia 05/11 - Code Blue w/ seizuing & hypoxia but no cardiac arrest >> reintubated by [facility] EDP 05/12 - Transfer to [facility] w/ re-intubation & septic shock  Assessment/plan:  Acute hypoxic respiratory failure with compromised airway. Hx of  COPD. Tobacco abuse. - pressure support wean as able >> not ready for extubation - scheduled duoneb  HCAP with Staph aureus in sputum culture. 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 - continue vimpat, depacon per neurology - continue folic acid, thiamine, MVI - RASS goal 0 to -1  VT in setting of haldol, hypokalemia >> resolved. - monitor hemodynamics - cardiology signed off 5/13  Hypokalemia. - replace electrolytes as needed  Hyperglycemia. - SSI  DVT prophylaxis - lovenox, SCDs SUP - protonix Nutrition - tube feeds Goals of care - full code  CC time 32 minutes  Coralyn Helling, MD [facility] Pulmonary/Critical Care 06/25/2016, 9:15 AM Pager:  [phone] After 3pm call: [phone]

## 2016-06-25 NOTE — Progress Notes (Signed)
Neurology Progress Note  I am assuming neurologic care of this patient today. I have reviewed his chart at length. History is limited to chart review as the patient is unable to participate with interview or exam as he is intubated and sedated. No family is present during my visit.   In brief, this is a 2-yo man with h/o TBI and post-traumatic epilepsy who was initially admitted on 06/12/16 after experiencing several seizures. Per notes, he had recently been released from jail and had been out of his seizure meds (Topamax and Depakote). He went out drinking with some friends on the day of admission and was observed to have two GTC seizures. EMS was called and he had a third seizure while being transported to the hospital. Family reported at the time that his last seizure occurred three weeks before his admission. He initially presented to [facility] where he had several additional seizures in the ED. He was intubated due to inability to protect his airway and placed on propofol. He was also loaded with Keppra and given Versed. He was extubated the same day. Neurology consultation was obtained and he was seen by Dr. Phillips Odor on 06/13/16. He felt that the patient's presentation with status epilepticus was most likely due to the fact that he was not taking his AEDs after he ran out of them. EEG was obtained and showed no seizure or epileptiform activity.   Per notes he remained very encephalopathic. He remained on BiPAP and was treated with Precedex. His mentation slowly improved. On 5/10 he developed vtach with prolonged QT interval, believed to be due to Haldol use. On 5/11 he had more seizure activity which was associated with hypoxia and he was again intubated. He has since had significant pulmonary secretions felt to be due to aspiration pneumonia. This was further complicated by the development of hypotension and he was transferred to [facility] for further management by critical care and  neurology for sepsis and seizures respectively. He has been maintained on Depakote and Keppra for his seizures with propofol for sedation on the ventilator. EEG was obtained on 06/23/16 and was interpreted as a normal study. He was seen in consultation by Dr. Patrcia Dolly (neurology) on 06/24/16. He felt that Edison may not be appropriate given the patient's reported history of aggressive and violent behavior and this was stopped. Vimpat 200 mg BID was started in place of Keppra. No seizures have been reported since 5/11.   Medications reviewed and reconciled.   Pertinent meds: lacosamide 200 mg q12h MVI Thiamine 100 mg daily Depacon 500 mg q8h Propofol drip  Current Meds:   Current Facility-Administered Medications:  .  0.9 %  sodium chloride infusion, , Intravenous, Continuous, Byrum, Rose Fillers, MD, Last Rate: 10 mL/hr at 06/24/16 1800 .  acetaminophen (TYLENOL) solution 650 mg, 650 mg, Per Tube, Q6H PRN, Javier Glazier, MD, 650 mg at 06/25/16 0228 .  albuterol (PROVENTIL) (2.5 MG/3ML) 0.083% nebulizer solution 2.5 mg, 2.5 mg, Nebulization, Q4H PRN, Sinda Du, MD .  chlorhexidine gluconate (MEDLINE KIT) (PERIDEX) 0.12 % solution 15 mL, 15 mL, Mouth Rinse, BID, Sinda Du, MD, 15 mL at 06/25/16 0730 .  Chlorhexidine Gluconate Cloth 2 % PADS 6 each, 6 each, Topical, Daily, Javier Glazier, MD, 6 each at 06/25/16 1000 .  enoxaparin (LOVENOX) injection 40 mg, 40 mg, Subcutaneous, Q24H, Sood, Vineet, MD, 40 mg at 06/25/16 0900 .  feeding supplement (VITAL AF 1.2 CAL) liquid 1,000 mL, 1,000 mL, Per Tube,  Continuous, Javier Glazier, MD, Last Rate: 75 mL/hr at 06/25/16 0130, 1,000 mL at 06/25/16 0130 .  fentaNYL (SUBLIMAZE) injection 25-100 mcg, 25-100 mcg, Intravenous, Q1H PRN, Javier Glazier, MD, 100 mcg at 06/25/16 1151 .  folic acid (FOLVITE) tablet 1 mg, 1 mg, Per Tube, Daily, Javier Glazier, MD, 1 mg at 06/25/16 [phone] .  hydrALAZINE (APRESOLINE) injection 10 mg, 10 mg,  Intravenous, Q6H PRN, Jani Gravel, MD, 10 mg at 06/25/16 0327 .  insulin aspart (novoLOG) injection 0-9 Units, 0-9 Units, Subcutaneous, Q4H, Opyd, Ilene Qua, MD, 1 Units at 06/25/16 0013 .  ipratropium-albuterol (DUONEB) 0.5-2.5 (3) MG/3ML nebulizer solution 3 mL, 3 mL, Nebulization, TID, Sinda Du, MD, 3 mL at 06/25/16 0755 .  lacosamide (VIMPAT) 200 mg in sodium chloride 0.9 % 25 mL IVPB, 200 mg, Intravenous, Q12H, Rogue Jury, MD, Stopped at 06/25/16 1130 .  LORazepam (ATIVAN) injection 1-2 mg, 1-2 mg, Intravenous, Q15 min PRN, Javier Glazier, MD .  MEDLINE mouth rinse, 15 mL, Mouth Rinse, QID, Sinda Du, MD, 15 mL at 06/25/16 0924 .  multivitamin liquid 15 mL, 15 mL, Per Tube, Daily, Javier Glazier, MD, 15 mL at 06/25/16 0947 .  [DISCONTINUED] ondansetron (ZOFRAN) tablet 4 mg, 4 mg, Oral, Q6H PRN **OR** ondansetron (ZOFRAN) injection 4 mg, 4 mg, Intravenous, Q6H PRN, Opyd, Anayia Eugene S, MD .  pantoprazole sodium (PROTONIX) 40 mg/20 mL oral suspension 40 mg, 40 mg, Per Tube, Daily, Javier Glazier, MD, 40 mg at 06/25/16 0947 .  propofol (DIPRIVAN) 1000 MG/100ML infusion, 0-50 mcg/kg/min, Intravenous, Continuous, Sinda Du, MD, Last Rate: 22.5 mL/hr at 06/25/16 1100, 40 mcg/kg/min at 06/25/16 1100 .  thiamine (VITAMIN B-1) tablet 100 mg, 100 mg, Per Tube, Daily, Javier Glazier, MD, 100 mg at 06/25/16 0942 .  valproate (DEPACON) 500 mg in dextrose 5 % 50 mL IVPB, 500 mg, Intravenous, Q8H, Phillips Odor, MD, Stopped at 06/25/16 0723 .  vancomycin (VANCOCIN) IVPB 1000 mg/200 mL premix, 1,000 mg, Intravenous, Q8H, Sinda Du, MD, Stopped at 06/25/16 1000  Objective:  Temp:  [97.9 F (36.6 C)-99.9 F (37.7 C)] 99.3 F (37.4 C) (05/14 1200) Pulse Rate:  [75-116] 95 (05/14 1200) Resp:  [12-35] 16 (05/14 1200) BP: (85-175)/(50-112) 98/57 (05/14 1200) SpO2:  [97 %-100 %] 98 % (05/14 1200) FiO2 (%):  [30 %-40 %] 30 % (05/14 1200) Weight:  [96.1 kg (211 lb 13.8  oz)] 96.1 kg (211 lb 13.8 oz) (05/14 0500)  General: WDWN Caucasian man lying in bed in NAD. He is intubated and sedated with propofol. He does not respond to verbal or tactile stimulation. He does not open his eyes spontaneously or with stimulation. He did not follow commands during my exam.  HEENT: Neck is supple without lymphadenopathy. ETT, OGT in place. clerae are anicteric. There is mild conjunctival injection.  CV: Regular, no murmur. Carotid pulses are 2+ and symmetric with no bruits. Distal pulses 2+ and symmetric.  Lungs: Scattered bibasilar rales. Ventilated.  Extremities: No C/C/E. Neuro: MS: As noted above.  CN: Pupils are equal and reactive from 3-->2 mm bilaterally. He does not blink to visual threat. His eyes are mildly dysconjugate. No forced deviation and no nystagmus. Corneals are intact and symmetric. His face appears to be grossly symmetric at rest but is partly obscured by tubes and tape. The remainder of his cranial nerves cannot be accurately assessed as he does not participate with the exam.  Motor: Normal bulk. Tone is reduced throughout. No spontaneous movement  is seen. He does not participate with confrontational testing. No tremor or other abnormal movements are observed.  Sensation: He withdraws from nailbed pressure x4.  DTRs: 3+, symmetric. He has 5-6 beats of clonus in both ankles. This quickly extinguishes with repetitive testing. Toes are downgoing bilaterally. Coordination/gait: These cannot be assessed as the patient is unable to participate with the exam since he is intubated and sedated.  Labs: Lab Results  Component Value Date   WBC 11.2 (H) 06/25/2016   HGB 11.9 (L) 06/25/2016   HCT 35.4 (L) 06/25/2016   PLT 280 06/25/2016   GLUCOSE 126 (H) 06/25/2016   TRIG 101 06/25/2016   ALT 15 (L) 06/23/2016   AST 25 06/23/2016   NA 145 06/25/2016   K 3.0 (L) 06/25/2016   CL 107 06/25/2016   CREATININE 0.74 06/25/2016   BUN 8 06/25/2016   CO2 30 06/25/2016    TSH 1.197 06/12/2016   INR 0.95 06/12/2016   HGBA1C 5.3 06/12/2016   CBC Latest Ref Rng & Units 06/25/2016 06/24/2016 06/23/2016  WBC 4.0 - 10.5 K/uL 11.2(H) 11.3(H) 23.0(H)  Hemoglobin 13.0 - 17.0 g/dL 11.9(L) 10.7(L) 14.7  Hematocrit 39.0 - 52.0 % 35.4(L) 32.1(L) 41.8  Platelets 150 - 400 K/uL 280 181 234    Lab Results  Component Value Date   HGBA1C 5.3 06/12/2016   Lab Results  Component Value Date   ALT 15 (L) 06/23/2016   AST 25 06/23/2016   ALKPHOS 48 06/23/2016   BILITOT 1.1 06/23/2016    Radiology:  I have personally and independently reviewed the [facility] without contrast from 06/22/16. This shows no acute abnormality. Mild diffuse atrophy is present.   Other diagnostic studies:  EEG from 06/23/16: Normal awake and sleep EEG.   A/P:   1. Seizures: Per history this represents breakthrough seizures in the setting of known post-traumatic epilepsy. His most recent seizures were likely provoked by infection and sepsis. Continue VPA and lacosamide. Continue to treat underlying infection and optimize metabolic status as you are. Seizure precautions.   2. Acute encephalopathy: This is most likely multifactorial in etiology with potential contributions from sepsis, hypoxia, hypotension, postictal state, medication effect.  RN reports then when sedation was reduced earlier today, he would fix and track on her and was able to move all extremities well. However, he becomes increasingly agitated and has to remains sedated as a result. Continue to optimize metabolic status and treat any underlying infection. Minimize the use of opiates, benzos or any medication with strong anticholinergic properties as much as possible. Optimize sleep-wake cycles as much as you can by keeping the room bright with activity during the day and dark and quiet at night. For agitation, recommend low-dose haloperidol or an atypical antipsychotic.   3. TBI: This is remote, no acute issues. Follow. Unclear what his  underlying cognitive and neuropsychiatric baseline is at this time.   No family present at the bedside at the time of my visit.   Neurology will continue to follow. Please call with any urgent questions or concerns.   Melba Coon, MD [facility]

## 2016-06-25 NOTE — Progress Notes (Signed)
[facility] FOR AM LAB REPLACEMENT ONLY  The patient does apply [facility] the [facility]ly[facility] Protocol based on the criteria listed below:   1. Is GFR >/= 40 ml/min? Yes.    Patient's GFR today is >60 2. Is urine output >/= 0.5 ml/kg/hr for the last 6 hours? Yes.   Patient's UOP is 1.1 ml/kg/hr 3. Is BUN < 60 mg/dL? Yes.    Patient's BUN today is 8 4. Abnormal electrolyte(s): K+3.0 5. Ordered repletion with: Protocol 6. If a panic level lab has been reported, has the CCM MD in charge been notified? No..   Physician:  Christene Slatese Dios  Cathlean CowerBradshaw, Emerson Barretto [facility] 06/25/2016 4:49 AM

## 2016-06-26 ENCOUNTER — Inpatient Hospital Stay (HOSPITAL_COMMUNITY): Payer: Medicaid Other

## 2016-06-26 LAB — GLUCOSE, CAPILLARY
GLUCOSE-CAPILLARY: 100 mg/dL — AB (ref 65–99)
GLUCOSE-CAPILLARY: 109 mg/dL — AB (ref 65–99)
GLUCOSE-CAPILLARY: 114 mg/dL — AB (ref 65–99)
GLUCOSE-CAPILLARY: 85 mg/dL (ref 65–99)
GLUCOSE-CAPILLARY: 88 mg/dL (ref 65–99)
Glucose-Capillary: 114 mg/dL — ABNORMAL HIGH (ref 65–99)
Glucose-Capillary: 106 mg/dL — ABNORMAL HIGH (ref 65–99)
Glucose-Capillary: 89 mg/dL (ref 65–99)

## 2016-06-26 LAB — BASIC METABOLIC PANEL
ANION GAP: 7 (ref 5–15)
BUN: 14 mg/dL (ref 6–20)
CHLORIDE: 108 mmol/L (ref 101–111)
CO2: 31 mmol/L (ref 22–32)
Calcium: 8.5 mg/dL — ABNORMAL LOW (ref 8.9–10.3)
Creatinine, Ser: 0.7 mg/dL (ref 0.61–1.24)
GFR calc Af Amer: >60 mL/min (ref >60)
GFR calc non Af Amer: >60 mL/min (ref >60)
Glucose, Bld: 111 mg/dL — ABNORMAL HIGH (ref 65–99)
POTASSIUM: 3.1 mmol/L — AB (ref 3.5–5.1)
SODIUM: 146 mmol/L — AB (ref 135–145)

## 2016-06-26 LAB — CBC
HEMATOCRIT: 32.8 % — AB (ref 39.0–52.0)
HEMOGLOBIN: 10.9 g/dL — AB (ref 13.0–17.0)
MCH: 31.9 pg (ref 26.0–34.0)
MCHC: 33.2 g/dL (ref 30.0–36.0)
MCV: 95.9 fL (ref 78.0–100.0)
PLATELETS: 296 10*3/uL (ref 150–400)
RBC: 3.42 MIL/uL — AB (ref 4.22–5.81)
RDW: 13.2 % (ref 11.5–15.5)
WBC: 8.5 10*3/uL (ref 4.0–10.5)

## 2016-06-26 LAB — CULTURE, RESPIRATORY: SPECIAL REQUESTS: NORMAL

## 2016-06-26 LAB — MAGNESIUM: Magnesium: 2.2 mg/dL (ref 1.7–2.4)

## 2016-06-26 LAB — CULTURE, RESPIRATORY W GRAM STAIN

## 2016-06-26 LAB — VALPROIC ACID LEVEL: Valproic Acid Lvl: 56 ug/mL (ref 50.0–100.0)

## 2016-06-26 LAB — VANCOMYCIN, TROUGH: Vancomycin Tr: 18 ug/mL (ref 15–20)

## 2016-06-26 LAB — LEVETIRACETAM LEVEL: Levetiracetam Lvl: 9.4 ug/mL — ABNORMAL LOW (ref 10.0–40.0)

## 2016-06-26 MED ORDER — MIDAZOLAM BOLUS VIA INFUSION
1.0000 mg | INTRAVENOUS | Status: DC | PRN
  Administered 2016-06-26 – 2016-06-27 (×2): 2 mg via INTRAVENOUS
  Filled 2016-06-26: qty 2

## 2016-06-26 MED ORDER — MIDAZOLAM HCL 2 MG/2ML IJ SOLN
INTRAMUSCULAR | Status: AC
  Filled 2016-06-26: qty 2

## 2016-06-26 MED ORDER — FENTANYL 2500MCG IN NS 250ML (10MCG/ML) PREMIX INFUSION
25.0000 ug/h | INTRAVENOUS | Status: DC
  Administered 2016-06-26 (×2): 50 ug/h via INTRAVENOUS
  Administered 2016-06-27: 100 ug/h via INTRAVENOUS
  Filled 2016-06-26 (×2): qty 250

## 2016-06-26 MED ORDER — SODIUM CHLORIDE 0.9 % IV SOLN: 2.0000 mg/h | INTRAVENOUS | Status: DC

## 2016-06-26 MED ORDER — POTASSIUM CHLORIDE 20 MEQ/15ML (10%) PO SOLN
30.0000 meq | ORAL | Status: AC
  Administered 2016-06-26 (×2): 30 meq
  Filled 2016-06-26 (×2): qty 30

## 2016-06-26 MED ORDER — SODIUM CHLORIDE 0.9 % IV SOLN
0.0000 mg/h | INTRAVENOUS | Status: DC
  Administered 2016-06-26: 2 mg/h via INTRAVENOUS
  Filled 2016-06-26 (×2): qty 10

## 2016-06-26 MED ORDER — ACETYLCYSTEINE 20 % IN SOLN
3.0000 mL | Freq: Two times a day (BID) | RESPIRATORY_TRACT | Status: DC
  Administered 2016-06-26 – 2016-06-28 (×5): 3 mL via RESPIRATORY_TRACT
  Filled 2016-06-26 (×5): qty 4

## 2016-06-26 MED ORDER — MIDAZOLAM HCL 2 MG/2ML IJ SOLN
2.0000 mg | INTRAMUSCULAR | Status: DC | PRN
  Administered 2016-06-26: 2 mg via INTRAVENOUS

## 2016-06-26 MED ORDER — FREE WATER
300.0000 mL | Freq: Three times a day (TID) | Status: DC
  Administered 2016-06-26 – 2016-06-27 (×3): 300 mL

## 2016-06-26 MED ORDER — FENTANYL BOLUS VIA INFUSION
50.0000 ug | INTRAVENOUS | Status: DC | PRN
  Administered 2016-06-26 – 2016-06-27 (×4): 50 ug via INTRAVENOUS
  Filled 2016-06-26: qty 50

## 2016-06-26 MED ORDER — FENTANYL CITRATE (PF) 100 MCG/2ML IJ SOLN: 50.0000 ug | Freq: Once | INTRAMUSCULAR | Status: AC

## 2016-06-26 MED ORDER — DEXTROSE 5 % IV SOLN
0.0000 ug/min | INTRAVENOUS | Status: DC
  Administered 2016-06-26: 10 ug/min via INTRAVENOUS
  Administered 2016-06-27: 5 ug/min via INTRAVENOUS
  Filled 2016-06-26 (×2): qty 4

## 2016-06-26 NOTE — Progress Notes (Signed)
Pharmacy Antibiotic Note  [name] is a 51 y.o. male admitted on 06/12/2016 with MRSA PNA  VT = 18  Plan: Continue vanc 1 g q8h  Height: 6\' 2"  (188 cm) Weight: 207 lb 14.3 oz (94.3 kg) IBW/kg (Calculated) : 82.2  Temp (24hrs), Avg:99 F (37.2 C), Min:97.7 F (36.5 C), Max:99.9 F (37.7 C)   Recent Labs Lab 06/23/16 0211 06/23/16 0515 06/23/16 1424 06/23/16 2300 06/24/16 0549 06/25/16 0320 06/26/16 0424 06/26/16 0800  WBC 19.1* 23.0*  --   --  11.3* 11.2* 8.5  --   CREATININE  --  0.79  --  0.84 0.75 0.74 0.70  --   LATICACIDVEN  --   --  1.0 0.9 0.7  --   --   --   VANCOTROUGH  --   --   --   --   --   --   --  18    Estimated Creatinine Clearance: 128.4 mL/min (by C-G formula based on SCr of 0.7 mg/dL).    Allergies  Allergen Reactions  . Oxycodone-Acetaminophen     Severe rash and itching  . Oxycodone Hcl     itching  . Percocet [Oxycodone-Acetaminophen] Other (See Comments)    itching   Isaac BlissMichael Emmeline Winebarger, PharmD, BCPS, BCCCP Clinical Pharmacist Clinical phone for 06/26/2016 from 7a-3:30p: [phone] If after 3:30p, please call main pharmacy at: [phone] 06/26/2016 12:40 PM

## 2016-06-26 NOTE — Progress Notes (Signed)
Bagged patient to break loose secretions as I couldn't pass the suction catheter. Multiple little plugs and thick secretions suctioned out. RN aware.

## 2016-06-26 NOTE — Progress Notes (Signed)
PCCM Progress Note  Admission date: 06/12/2016 Consult date: 06/23/2016 Referring provider: Dr. Juanetta Gosling, [facility]  CC: Seizure  HPI: 51 yo male presented to [facility] with recurrent seizure after being released from jail.  Uncertain if he was taking topamax, depakote.  He had alcohol binge and developed generalized seizure.  He required intubation for airway protection.  He was extubated.  He developed progressive confusion and agitation, and required reintubation for airway protection after developing VT from haldol. Agitation is an issue with care and ability to wean.  Subjective: Full vent support , awake and alert, selectively following commands.Failed wean attempt this am with apnea..  Vital signs: BP (!) 165/88   Pulse (!) 111   Temp 99.3 F (37.4 C)   Resp (!) 29   Ht 6\' 2"  (1.88 m)   Wt 207 lb 14.3 oz (94.3 kg)   SpO2 100%   BMI 26.69 kg/m   Intake/output: I/O last 3 completed shifts: In: 5469.2 [P.O.:800; I.V.:1024.2; NG/GT:2840; IV Piggyback:805] Out: 3665 [Urine:3665]  General: Awake and alert, selectively following commands Neuro: MAE x 4, lip speaks, Unable to assess orientation HEENT: ETT in place, pupils reactive, normocephalic, atraumatic Cardiac: RRR, S1, S2, no RMG Chest: Diminished per bases with fine crackles, thick tan copious secretions Abd: soft, non tender, non-distended, BS + Ext: no edema, no obvious deformities Skin: no rashes, lesions noted, no mottling   CMP Latest Ref Rng & Units 06/26/2016 06/25/2016 06/24/2016  Glucose 65 - 99 mg/dL 161(W) 960(A) 540(J)  BUN 6 - 20 mg/dL 14 8 10   Creatinine 0.61 - 1.24 mg/dL 8.11 9.14 7.82  Sodium 135 - 145 mmol/L 146(H) 145 141  Potassium 3.5 - 5.1 mmol/L 3.1(L) 3.0(L) 3.1(L)  Chloride 101 - 111 mmol/L 108 107 108  CO2 22 - 32 mmol/L 31 30 26   Calcium 8.9 - 10.3 mg/dL 9.5(A) 2.1(H) 8.1(L)  Total Protein 6.5 - 8.1 g/dL - - -  Total Bilirubin 0.3 - 1.2 mg/dL - - -  Alkaline Phos 38 - 126 U/L - - -  AST 15 - 41 U/L -  - -  ALT 17 - 63 U/L - - -     CBC Latest Ref Rng & Units 06/26/2016 06/25/2016 06/24/2016  WBC 4.0 - 10.5 K/uL 8.5 11.2(H) 11.3(H)  Hemoglobin 13.0 - 17.0 g/dL 10.9(L) 11.9(L) 10.7(L)  Hematocrit 39.0 - 52.0 % 32.8(L) 35.4(L) 32.1(L)  Platelets 150 - 400 K/uL 296 280 181     ABG    Component Value Date/Time   PHART 7.521 (H) 06/24/2016 0430   PCO2ART 32.7 06/24/2016 0430   PO2ART 75.3 (L) 06/24/2016 0430   HCO3 27.0 06/24/2016 0430   TCO2 28 06/12/2016 1218   ACIDBASEDEF 1.9 06/12/2016 1505   O2SAT 96.3 06/24/2016 0430     CBG (last 3)   Recent Labs  06/26/16 0404 06/26/16 0735 06/26/16 1101  GLUCAP 100* 114* 109*     Imaging: Dg Chest Port 1 View  Result Date: 06/26/2016 CLINICAL DATA:  Respiratory failure. EXAM: PORTABLE CHEST 1 VIEW COMPARISON:  06/25/2016. FINDINGS: Endotracheal tube, NG tube, right PICC line in stable position. Heart size normal. Right lung base persistent infiltrate. No pleural effusion or pneumothorax. IMPRESSION: 1.  Lines and tubes in stable position. 2. Right lung base persistent infiltrate. No change from prior exam. Electronically Signed   By: Maisie Fus  Register   On: 06/26/2016 06:37   Dg Chest Port 1 View  Result Date: 06/25/2016 CLINICAL DATA:  Respiratory failure. EXAM: PORTABLE CHEST 1  VIEW COMPARISON:  06/24/2016 . FINDINGS: Endotracheal tube, NG tube, right PICC line in stable position. Heart size normal. Persistent infiltrate right lung base without interim change. No pleural effusion or pneumothorax. IMPRESSION: 1. Lines and tubes in stable position. 2. Persistent infiltrate right lung base.  No interim change. Electronically Signed   By: Maisie Fushomas  Register   On: 06/25/2016 06:54     Studies: UDS 5/1:  Benzodiazepines EEG 5/2:  This recording of the awake and sleep states shows moderate global slowing. However, there is no epileptiform activity is observed. CT HEAD W/O 5/1:  No acute intracranial abnormality. CT HEAD W/O 5/11:  No  acute intracranial findings. TTE 5/11:  LV not well visualized. EF 60-65%. Unable to assess LV wall motion or diastolic dysfunction. LA normal in size. RV poorly visualized but appeared grossly normal in size and function. Aortic valve not well visualized but without significant stenosis or regurgitation. Aorta not well visualized. Mitral valve not well visualized but no grossly significant regurgitation or stenosis. Pulmonic valve not well visualized. Tricuspid valve not well visualized but without significant stenosis or regurgitation. No pericardial effusion.  EEG 5/12 >>> normal EEG in the awake and sleep states.  Antibiotics: Zosyn 5/2 >>> Vancomycin 5/12 >>>  Cultures: MRSA PCR 5/1:  Negative HIV 5/1:  Nonreactive  Blood Cultures x2 5/12 >>> Urine Culture 5/12 >>> negative Tracheal Aspirate Culture 5/12 >>> Staph aureus >>>  Lines/tubes: ETT 5/01 >> 5/01 ETT 5/11 >> Rt arm PICC 5/10 >>  Events: 05/01 - Admit to [facility] with seizures 05/02 - Valproate started 05/04 - Started on Keppra 05/10 - V tach w/ Haldol & Hypokalemia 05/11 - Code Blue w/ seizuing & hypoxia but no cardiac arrest >> reintubated by [facility] EDP 05/12 - [facility]/ [facility]-int[facility] & septic shock 05/15 - Continued agitation requiring propofol, fentanyl and versed to manage  Assessment/plan:  Acute hypoxic respiratory failure with compromised airway. Hx of COPD. Tobacco abuse. Thick secretions, plugging Plan: - pressure support wean as able >> failed SBT with apnea 5/15 - scheduled duoneb - Maintain saturations 88-92% - Mucomist nebs BID  HCAP with Staph aureus in sputum culture. 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 - continue vimpat, depacon per neurology - continue folic acid, thiamine, MVI - RASS goal 0 to -1  VT in setting of haldol, hypokalemia >> resolved. -  Telemetry monitoring - 12 Lead prn to monitor QTc - monitor hemodynamics - cardiology signed off 5/13 - Maintain MAP > 65  Hypokalemia. - Repleted 5/15 - replace electrolytes as needed  Anemia No obvious source of bleeding Plan: - Trend CBC daily - Transfuse for hgb < 7  Hyperglycemia. - SSI - CBG q 4  Agitation - Rass Goal 0 to -1 - Maxed on Propofol - Fentanyl pushes dropping BP - Versed with some improvement  Plan: - Continue Propofol   - Add Versed gtt - Fentanyl as BP allows  DVT prophylaxis - lovenox, SCDs SUP - protonix Nutrition - tube feeds at goal Goals of care - full code  APP time 32 minutes  Bevelyn NgoSarah F. Groce, AGACNP-BC [facility] Pager # [phone] 06/26/2016, 12:49 PM After 3pm call: [phone]   STAFF NOTE: Cindi CarbonI, Daniel Feinstein, MD FACP have personally reviewed patient's available data, including medical history, events of note, physical examination and test results as part of  my evaluation. I have discussed with resident/NP and other care providers such as pharmacist, RN and RRT. In addition, I personally evaluated patient and elicited key findings of: eyes open, agitation evident, ronchi moderate diffuse, min edema, remains with hilar infiltrate pcxr which I reviewed, last abg noted, likely should reduce TV given ht and rate / MV, appears to have staph PNA, continued vanc, secretions increased add mucomysts, no collapse on pcxr repeat in am , may need peep 8 to help aeration if desat, he is failing current sedation, will add versed / fent drip to prop, maintain anti seizure meds per neuro, maintain feeding, no family in room The patient is critically ill with multiple organ systems failure and requires high complexity decision making for assessment and support, frequent evaluation and titration of therapies, application of advanced monitoring technologies and extensive interpretation of multiple databases.   Critical Care  Time devoted to patient care services described in this note is 35 Minutes. This time reflects time of care of this signee: Rory Percy, MD FACP. This critical care time does not reflect procedure time, or teaching time or supervisory time of PA/NP/Med student/Med Resident etc but could involve care discussion time. Rest per NP/medical resident whose note is outlined above and that I agree with   Mcarthur Rossetti. Tyson Alias, MD, FACP Pgr: [phone] [facility] 06/26/2016 4:40 PM

## 2016-06-26 NOTE — Progress Notes (Signed)
Neurology Progress Note  Subjective: No reported seizures overnight. He remains intubated with significant respiratory secretions per RN. He has been off of propofol for about 30 minutes at the time of my visit. RN reports that he is alert and able to follow commands but is becoming increasingly agitated. The patient is alert and will look at me when I speak to him. He tries to speak around his ETT but I am unable to tell what he is trying to say. He nods and shakes his head in response to questions. ROS is limited by intubation.   Medications reviewed and reconciled.   Pertinent meds: lacosamide 200 mg q12h MVI Thiamine 100 mg daily Folate 1 mg daily Depacon 500 mg q8h Propofol drip, currently off  Current Meds:   Current Facility-Administered Medications:  .  0.9 %  sodium chloride infusion, , Intravenous, Continuous, Byrum, Rose Fillers, MD, Last Rate: 10 mL/hr at 06/24/16 1800 .  acetaminophen (TYLENOL) solution 650 mg, 650 mg, Per Tube, Q6H PRN, Javier Glazier, MD, 650 mg at 06/25/16 1511 .  albuterol (PROVENTIL) (2.5 MG/3ML) 0.083% nebulizer solution 2.5 mg, 2.5 mg, Nebulization, Q4H PRN, Sinda Du, MD .  chlorhexidine gluconate (MEDLINE KIT) (PERIDEX) 0.12 % solution 15 mL, 15 mL, Mouth Rinse, BID, Sinda Du, MD, 15 mL at 06/26/16 0800 .  Chlorhexidine Gluconate Cloth 2 % PADS 6 each, 6 each, Topical, Daily, Javier Glazier, MD, 6 each at 06/25/16 1000 .  enoxaparin (LOVENOX) injection 40 mg, 40 mg, Subcutaneous, Q24H, Sood, Vineet, MD, 40 mg at 06/25/16 0900 .  feeding supplement (VITAL AF 1.2 CAL) liquid 1,000 mL, 1,000 mL, Per Tube, Continuous, Javier Glazier, MD, Last Rate: 75 mL/hr at 06/26/16 0536, 1,000 mL at 06/26/16 0536 .  fentaNYL (SUBLIMAZE) injection 25-100 mcg, 25-100 mcg, Intravenous, Q1H PRN, Javier Glazier, MD, 100 mcg at 06/26/16 8295 .  folic acid (FOLVITE) tablet 1 mg, 1 mg, Per Tube, Daily, Javier Glazier, MD, 1 mg at 06/25/16 [phone] .   hydrALAZINE (APRESOLINE) injection 10 mg, 10 mg, Intravenous, Q6H PRN, Jani Gravel, MD, 10 mg at 06/25/16 0327 .  insulin aspart (novoLOG) injection 0-9 Units, 0-9 Units, Subcutaneous, Q4H, Opyd, Ilene Qua, MD, 1 Units at 06/25/16 0013 .  ipratropium-albuterol (DUONEB) 0.5-2.5 (3) MG/3ML nebulizer solution 3 mL, 3 mL, Nebulization, TID, Sinda Du, MD, 3 mL at 06/25/16 1943 .  lacosamide (VIMPAT) 200 mg in sodium chloride 0.9 % 25 mL IVPB, 200 mg, Intravenous, Q12H, Rogue Jury, MD, Stopped at 06/25/16 2336 .  LORazepam (ATIVAN) injection 1-2 mg, 1-2 mg, Intravenous, Q15 min PRN, Javier Glazier, MD .  MEDLINE mouth rinse, 15 mL, Mouth Rinse, QID, Sinda Du, MD, 15 mL at 06/26/16 0440 .  multivitamin liquid 15 mL, 15 mL, Per Tube, Daily, Javier Glazier, MD, 15 mL at 06/25/16 0947 .  [DISCONTINUED] ondansetron (ZOFRAN) tablet 4 mg, 4 mg, Oral, Q6H PRN **OR** ondansetron (ZOFRAN) injection 4 mg, 4 mg, Intravenous, Q6H PRN, Opyd, Angelgabriel Willmore S, MD .  pantoprazole sodium (PROTONIX) 40 mg/20 mL oral suspension 40 mg, 40 mg, Per Tube, Daily, Javier Glazier, MD, 40 mg at 06/25/16 0947 .  potassium chloride 20 MEQ/15ML (10%) solution 30 mEq, 30 mEq, Per Tube, Q4H, de Dios, [place] A, MD, 30 mEq at 06/26/16 0737 .  propofol (DIPRIVAN) 1000 MG/100ML infusion, 0-50 mcg/kg/min, Intravenous, Continuous, Sinda Du, MD, Last Rate: 22.5 mL/hr at 06/26/16 0600, 40 mcg/kg/min at 06/26/16 0600 .  thiamine (VITAMIN B-1) tablet  100 mg, 100 mg, Per Tube, Daily, Javier Glazier, MD, 100 mg at 06/25/16 0942 .  valproate (DEPACON) 500 mg in dextrose 5 % 50 mL IVPB, 500 mg, Intravenous, Q8H, Phillips Odor, MD, Stopped at 06/26/16 [phone] .  vancomycin (VANCOCIN) IVPB 1000 mg/200 mL premix, 1,000 mg, Intravenous, Q8H, Sinda Du, MD, Last Rate: 200 mL/hr at 06/26/16 0814, 1,000 mg at 06/26/16 0814  Objective:  Temp:  [97.7 F (36.5 C)-99.9 F (37.7 C)] 99.3 F (37.4 C) (05/15  0700) Pulse Rate:  [70-113] 84 (05/15 0700) Resp:  [12-20] 16 (05/15 0700) BP: (85-166)/(45-101) 87/53 (05/15 0700) SpO2:  [94 %-100 %] 97 % (05/15 0700) FiO2 (%):  [30 %] 30 % (05/15 0800) Weight:  [94.3 kg (207 lb 14.3 oz)] 94.3 kg (207 lb 14.3 oz) (05/15 0500)  General: WDWN Caucasian man lying in bed. He is intubated propofol is presently off. He is alert and looks at me when I enter the room. He fixes and tracks readily. Follows simple midline and appendicular commands.  HEENT: Neck is supple without lymphadenopathy. ETT, OGT in place. Sclerae are anicteric. There is mild conjunctival injection.  CV: Regular, no murmur. Carotid pulses are 2+ and symmetric with no bruits. Distal pulses 2+ and symmetric.  Lungs: CTAB. Ventilated.  Extremities: No C/C/E. Neuro: MS: As noted above.  CN: Pupils are equal and reactive from 3-->2 mm bilaterally. He blinks to visual threat from all quadrants. His eyes are conjugate with full voluntary saccades in all directions. No forced deviation and no nystagmus. Corneals are intact and symmetric. His face appears to be symmetric at rest with normal strength but is partly obscured by tubes and tape. Bilateral SCMs 5/5. Tongue protrudes to midline around his endotracheal tube. The remainder of his cranial nerves cannot be accurately assessed as he is intubated.  Motor: Normal bulk, tone, strength. No tremor or other abnormal movements are observed.  Sensation: He withdraws from minimal noxious stimulation to all extremities.  DTRs: 3+, symmetric. Toes are downgoing bilaterally. Coordination: No overt dysmetria with movements of his upper extremities.  Labs: Lab Results  Component Value Date   WBC 8.5 06/26/2016   HGB 10.9 (L) 06/26/2016   HCT 32.8 (L) 06/26/2016   PLT 296 06/26/2016   GLUCOSE 111 (H) 06/26/2016   TRIG 101 06/25/2016   ALT 15 (L) 06/23/2016   AST 25 06/23/2016   NA 146 (H) 06/26/2016   K 3.1 (L) 06/26/2016   CL 108 06/26/2016    CREATININE 0.70 06/26/2016   BUN 14 06/26/2016   CO2 31 06/26/2016   TSH 1.197 06/12/2016   INR 0.95 06/12/2016   HGBA1C 5.3 06/12/2016   CBC Latest Ref Rng & Units 06/26/2016 06/25/2016 06/24/2016  WBC 4.0 - 10.5 K/uL 8.5 11.2(H) 11.3(H)  Hemoglobin 13.0 - 17.0 g/dL 10.9(L) 11.9(L) 10.7(L)  Hematocrit 39.0 - 52.0 % 32.8(L) 35.4(L) 32.1(L)  Platelets 150 - 400 K/uL 296 280 181    Lab Results  Component Value Date   HGBA1C 5.3 06/12/2016   Lab Results  Component Value Date   ALT 15 (L) 06/23/2016   AST 25 06/23/2016   ALKPHOS 48 06/23/2016   BILITOT 1.1 06/23/2016   Magnesium 2.2  Radiology:  There is no new neuroimaging today.  Other diagnostic studies:  EEG from 06/23/16: Normal awake and sleep EEG.   A/P:   1. 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 His most recent  bout of seizures was probably provoked by hypoxia and possible sepsis. Continue VPA and lacosamide at current doses. I will check a valproic acid level today. Can likely transition back to his outpatient regimen of Depakote and Topamax once he is extubated and able to tolerate oral medications. Continue to treat underlying infection and optimize metabolic status as you are. Seizure precautions.   2. Acute encephalopathy: This is most likely multifactorial in etiology with potential contributions from sepsis, hypoxia, hypotension, postictal state, medication effect. This morning he is very alert, following commands appropriately when his sedation is off. Hopefully he will be able to be extubated soon, as I think that this will improve his level of agitation. Continue to optimize metabolic status and treat any underlying infection. Minimize the use of opiates, benzos or any medication with strong anticholinergic properties as much as possible. Optimize sleep-wake cycles as much as you can by keeping the room bright with activity during  the day and dark and quiet at night. For agitation, recommend low-dose haloperidol or an atypical antipsychotic.   3. TBI: This is remote, no acute issues. Follow. Unclear what his underlying cognitive and neuropsychiatric baseline is at this time.   No family present at the bedside at the time of my visit.   Neurology will continue to follow. Please call with any urgent questions or concerns.   Melba Coon, MD [facility]

## 2016-06-26 NOTE — Progress Notes (Signed)
[facility] FOR AM LAB REPLACEMENT ONLY  The patient does apply for the [facility] Electrolyte Replacment Protocol based on the criteria listed below:   1. Is GFR >/= 40 ml/min? Yes.    Patient's GFR today is >60 2. Is urine output >/= 0.5 ml/kg/hr for the last 6 hours? Yes.   Patient's UOP is 0.73 ml/kg/hr 3. Is BUN < 60 mg/dL? Yes.    Patient's BUN today is 14 4. Abnormal electrolyte(s): Potassium 3.1 5. Ordered repletion with: Potassium per protocol 6. If a panic level lab has been reported, has the CCM MD in charge been notified? No..   Physician:    Thomasenia BottomsLANTZY, Kely Dohn P 06/26/2016 6:36 AM

## 2016-06-26 NOTE — Care Management Note (Signed)
Case Management Note  Patient Details  Name: [name] MRN: [id number] Date of Birth: 07-Mar-1965  Subjective/Objective:     [facility] transferred from [facility] with seizures post ETOH  - pt had to intubated/extubated and now reintubated - now possibly septic        Action/Plan:   PTA ETOH abuse - CSW consulted.  No family at bedside   Expected Discharge Date:  06/15/16               Expected Discharge Plan:     In-House Referral:     Discharge planning Services  CM Consult  Post Acute Care Choice:    Choice offered to:     DME Arranged:    DME Agency:     HH Arranged:    HH Agency:     Status of Service:     If discussed at [employer] Meetings, dates discussed:    Additional Comments:  Cherylann ParrClaxton, Jed Kutch S, RN 06/26/2016, 11:32 AM

## 2016-06-26 NOTE — Progress Notes (Signed)
eLink Physician-Brief Progress Note Patient Name: Clifford LocusSteven L Morris DOB: 20-Feb-1965 MRN: [id number]   Date of Service  06/26/2016  HPI/Events of Note  Patient with difficulty in sedation. Currently on propofol, Versed drip, fentanyl drip. Sedation drops his blood pressure.   eICU Interventions  Start levo fed      Intervention Category Major Interventions: Other:  Daneen SchickJose Angelo A De Dios 06/26/2016, 11:19 PM

## 2016-06-27 ENCOUNTER — Inpatient Hospital Stay (HOSPITAL_COMMUNITY): Payer: Medicaid Other

## 2016-06-27 LAB — BASIC METABOLIC PANEL
Anion gap: 7 (ref 5–15)
BUN: 17 mg/dL (ref 6–20)
CO2: 27 mmol/L (ref 22–32)
CREATININE: 0.73 mg/dL (ref 0.61–1.24)
Calcium: 7.8 mg/dL — ABNORMAL LOW (ref 8.9–10.3)
Chloride: 105 mmol/L (ref 101–111)
GFR calc Af Amer: >60 mL/min (ref >60)
Glucose, Bld: 177 mg/dL — ABNORMAL HIGH (ref 65–99)
Potassium: 3.1 mmol/L — ABNORMAL LOW (ref 3.5–5.1)
SODIUM: 139 mmol/L (ref 135–145)

## 2016-06-27 LAB — GLUCOSE, CAPILLARY
GLUCOSE-CAPILLARY: 123 mg/dL — AB (ref 65–99)
GLUCOSE-CAPILLARY: 110 mg/dL — AB (ref 65–99)
GLUCOSE-CAPILLARY: 119 mg/dL — AB (ref 65–99)
Glucose-Capillary: 103 mg/dL — ABNORMAL HIGH (ref 65–99)
Glucose-Capillary: 104 mg/dL — ABNORMAL HIGH (ref 65–99)
Glucose-Capillary: 107 mg/dL — ABNORMAL HIGH (ref 65–99)
Glucose-Capillary: 128 mg/dL — ABNORMAL HIGH (ref 65–99)

## 2016-06-27 LAB — CBC
HCT: 32.2 % — ABNORMAL LOW (ref 39.0–52.0)
Hemoglobin: 10.8 g/dL — ABNORMAL LOW (ref 13.0–17.0)
MCH: 32.6 pg (ref 26.0–34.0)
MCHC: 33.5 g/dL (ref 30.0–36.0)
MCV: 97.3 fL (ref 78.0–100.0)
PLATELETS: 319 10*3/uL (ref 150–400)
RBC: 3.31 MIL/uL — ABNORMAL LOW (ref 4.22–5.81)
RDW: 13.4 % (ref 11.5–15.5)
WBC: 9.3 10*3/uL (ref 4.0–10.5)

## 2016-06-27 MED ORDER — VANCOMYCIN HCL IN DEXTROSE 1-5 GM/200ML-% IV SOLN
1000.0000 mg | Freq: Three times a day (TID) | INTRAVENOUS | Status: DC
  Filled 2016-06-27 (×3): qty 200

## 2016-06-27 MED ORDER — VANCOMYCIN HCL IN DEXTROSE 1-5 GM/200ML-% IV SOLN
1000.0000 mg | Freq: Three times a day (TID) | INTRAVENOUS | Status: DC
  Administered 2016-06-27 – 2016-06-30 (×8): 1000 mg via INTRAVENOUS
  Filled 2016-06-27 (×9): qty 200

## 2016-06-27 MED ORDER — POTASSIUM CHLORIDE 20 MEQ/15ML (10%) PO SOLN
30.0000 meq | ORAL | Status: AC
  Administered 2016-06-27 (×2): 30 meq
  Filled 2016-06-27 (×2): qty 30

## 2016-06-27 MED ORDER — POTASSIUM CHLORIDE 20 MEQ/15ML (10%) PO SOLN: 40.0000 meq | Freq: Once | ORAL | Status: DC

## 2016-06-27 MED ORDER — IBUPROFEN 200 MG PO TABS
400.0000 mg | ORAL_TABLET | Freq: Four times a day (QID) | ORAL | Status: DC | PRN
  Administered 2016-06-27: 400 mg via ORAL
  Filled 2016-06-27: qty 2

## 2016-06-27 NOTE — Progress Notes (Signed)
eLink Physician-Brief Progress Note Patient Name: Clifford LocusSteven L Morris DOB: 06-17-1965 MRN: [id number]   Date of Service  06/27/2016  HPI/Events of Note  Temp = 101.6 F. Already has Tylenol liquid ordered per tube. However, he has no tube and he has an allergy to Oxycodone - Acetaminophen. Creatinine = 0.73.   eICU Interventions  Will order: 1. D/C Tylenol liquid. 2. Motrin 400 mg PO Q 6 hours PRN Temp > 101.0 F.     Intervention Category Intermediate Interventions: Other:  [name],[name] 06/27/2016, 10:13 PM

## 2016-06-27 NOTE — Progress Notes (Signed)
PCCM Progress Note  Admission date: 06/12/2016 Consult date: 06/23/2016 Referring provider: Dr. Juanetta GoslingHawkins, [facility]  CC: Seizure  HPI: 51 [facility].  Uncertain if he was taking topamax, depakote.  He had alcohol binge and developed generalized seizure.  He required intubation for airway protection.  He was extubated.  He developed progressive confusion and agitation, and required reintubation for airway protection after developing VT from haldol. Agitation is an issue with care and ability to wean.  Subjective: Heavily sedated with multiple agents. Chemically restrained .  Vital signs: BP 104/67   Pulse 79   Temp 99.9 F (37.7 C)   Resp 16   Ht 6\' 2"  (1.88 m)   Wt 211 lb 6.7 oz (95.9 kg)   SpO2 99%   BMI 27.14 kg/m   Intake/output: I/O last 3 completed shifts: In: 5718 [I.V.:1558; NG/GT:3105; IV Piggyback:1055] Out: 2675 [Urine:2675]  General:  WNWDWM heavily sedated HEENT: MM pink/moist, ett-> vent, ogt -> TF WUJ:WJXBJYNPSY:Sedated Neuro: Heavily sedated CV: s1s2 rrr, no m/r/g. Norepi drip to maintain sbp >90 PULM: even/non-labored, lungs bilaterally rhonchi and decreased in bases. CxR better WG:NFAOGI:soft, non-tender, bsx4 active , tf a @ goal Extremities: warm/dry, no edema  Skin: no rashes or lesions, wdi   Note increased aeration   CMP Latest Ref Rng & Units 06/27/2016 06/26/2016 06/25/2016  Glucose 65 - 99 mg/dL 130(Q177(H) 657(Q111(H) 469(G126(H)  BUN 6 - 20 mg/dL 17 14 8   Creatinine 0.61 - 1.24 mg/dL 2.950.73 2.840.70 1.320.74  Sodium 135 - 145 mmol/L 139 146(H) 145  Potassium 3.5 - 5.1 mmol/L 3.1(L) 3.1(L) 3.0(L)  Chloride 101 - 111 mmol/L 105 108 107  CO2 22 - 32 mmol/L 27 31 30   Calcium 8.9 - 10.3 mg/dL 7.8(L) 8.5(L) 8.5(L)  Total Protein 6.5 - 8.1 g/dL - - -  Total Bilirubin 0.3 - 1.2 mg/dL - - -  Alkaline Phos 38 - 126 U/L - - -  AST 15 - 41 U/L - - -  ALT 17 - 63 U/L - - -     CBC Latest Ref Rng & Units 06/27/2016 06/26/2016  06/25/2016  WBC 4.0 - 10.5 K/uL 9.3 8.5 11.2(H)  Hemoglobin 13.0 - 17.0 g/dL 10.8(L) 10.9(L) 11.9(L)  Hematocrit 39.0 - 52.0 % 32.2(L) 32.8(L) 35.4(L)  Platelets 150 - 400 K/uL 319 296 280     ABG    Component Value Date/Time   PHART 7.521 (H) 06/24/2016 0430   PCO2ART 32.7 06/24/2016 0430   PO2ART 75.3 (L) 06/24/2016 0430   HCO3 27.0 06/24/2016 0430   TCO2 28 06/12/2016 1218   ACIDBASEDEF 1.9 06/12/2016 1505   O2SAT 96.3 06/24/2016 0430     CBG (last 3)   Recent Labs  06/26/16 2056 06/27/16 0036 06/27/16 0438  GLUCAP 89 128* 123*     Imaging: Dg Chest Port 1 View  Result Date: 06/27/2016 CLINICAL DATA:  Ventilator support.  Respiratory failure. EXAM: PORTABLE CHEST 1 VIEW COMPARISON:  06/26/2016 FINDINGS: Endotracheal tube tip is 3 cm above the carina. Nasogastric tube enters stomach. Right arm PICC tip is at the SVC RA junction. There is improving infiltrate/atelectasis in both lower lobes right more than left. No worsening or new findings. IMPRESSION: Lines and tubes well positioned. Improving infiltrate/ atelectasis in the lower lobes. Electronically Signed   By: Paulina FusiMark  Shogry M.D.   On: 06/27/2016 07:20   Dg Chest Port 1 View  Result Date: 06/26/2016 CLINICAL DATA:  Respiratory failure.  EXAM: PORTABLE CHEST 1 VIEW COMPARISON:  06/25/2016. FINDINGS: Endotracheal tube, NG tube, right PICC line in stable position. Heart size normal. Right lung base persistent infiltrate. No pleural effusion or pneumothorax. IMPRESSION: 1.  Lines and tubes in stable position. 2. Right lung base persistent infiltrate. No change from prior exam. Electronically Signed   By: Maisie Fus  Register   On: 06/26/2016 06:37     Studies: UDS 5/1:  Benzodiazepines EEG 5/2:  This recording of the awake and sleep states shows moderate global slowing. However, there is no epileptiform activity is observed. CT HEAD W/O 5/1:  No acute intracranial abnormality. CT HEAD W/O 5/11:  No acute intracranial  findings. TTE 5/11:  LV not well visualized. EF 60-65%. Unable to assess LV wall motion or diastolic dysfunction. LA normal in size. RV poorly visualized but appeared grossly normal in size and function. Aortic valve not well visualized but without significant stenosis or regurgitation. Aorta not well visualized. Mitral valve not well visualized but no grossly significant regurgitation or stenosis. Pulmonic valve not well visualized. Tricuspid valve not well visualized but without significant stenosis or regurgitation. No pericardial effusion.  EEG 5/12 >>> normal EEG in the awake and sleep states.  Antibiotics: Zosyn 5/2 >>>5/14 Vancomycin 5/12 >>>  Cultures: MRSA PCR 5/1:  Negative HIV 5/1:  Nonreactive  Blood Cultures x2 5/12 >>> Urine Culture 5/12 >>> negative Tracheal Aspirate Culture 5/12 >>> Staph aureus >>>MRSA  Lines/tubes: ETT 5/01 >> 5/01 ETT 5/11 >> Rt arm PICC 5/10 >>  Events: 05/01 - [facility]mit to [facility] with seizures 05/02 - Valproate started 05/04 - Started on Keppra 05/10 - V tach w/ Haldol & Hypokalemia 05/11 - Code Blue w/ seizuing & hypoxia but no cardiac arrest >> reintubated by [facility] EDP 05/12 - Transfer to [facility] w/ re-intubation & septic shock 05/15 - Continued agitation requiring propofol, fentanyl and versed to manage 5/16 heavily sedated and on pressor support.  Assessment/plan:  Acute hypoxic respiratory failure with compromised airway. Hx of COPD. Tobacco abuse. Thick secretions, plugging Plan: - pressure support wean as able >> failed SBT with apnea 5/15, heavily sedated 5/16, will decrease sedation and attempt wean - scheduled duoneb - Maintain saturations 88-92% - Mucomist nebs BID, day 2 . Consider dc day 3  HCAP with MRSA in sputum culture. 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 - continue vimpat, depacon per neurology - continue folic acid,  thiamine, MVI - RASS goal 0 to -1  VT in setting of haldol, hypokalemia >> resolved. - Telemetry monitoring - 12 Lead prn to monitor QTc - monitor hemodynamics - cardiology signed off 5/13 - Maintain MAP > 65  Hypokalemia.  Recent Labs Lab 06/25/16 0320 06/26/16 0424 06/27/16 0450  K 3.0* 3.1* 3.1*     - Repleted 5/15 - replace electrolytes as needed, mg/phos checked and n6  Anemia  Recent Labs  06/26/16 0424 06/27/16 0450  HGB 10.9* 10.8*    No obvious source of bleeding Plan: - Trend CBC daily - Transfuse for hgb < 7  Hyperglycemia. CBG (last 3)   Recent Labs  06/26/16 2056 06/27/16 0036 06/27/16 0438  GLUCAP 89 128* 123*     - SSI - CBG q 4  Agitation/sedated - Rass Goal 0 to -1 - Maxed on Propofol - Fentanyl 50 mcg per hour - Versed 1 mg per hour  Plan: - Continue Propofol at  decreased dose - Continue Versed gtt - Fentanyl and requiring levo drip to maintain adequate SBP -5/16 will decrease sedation and attempt SBT/WUA  DVT prophylaxis - lovenox, SCDs SUP - protonix Nutrition - tube feeds at goal Goals of care - full code  [facility] CCT 30 min  Brett Canales Minor ACNP Adolph Pollack PCCM Pager [phone] till 3 pm If no answer page [phone] 06/27/2016, 8:08 AM   STAFF NOTE: I, Rory Percy, MD FACP have personally reviewed patient's available data, including medical history, events of note, physical examination and test results as part of my evaluation. I have discussed with resident/NP and other care providers such as pharmacist, RN and RRT. In addition, I personally evaluated patient and elicited key findings of: awake now this am , fc well, calmer, ronchi improved, secretions noted but has strong cough, pcxr I reviewed is improved ATX, int changes  Rll, wean PS 15 started lowering to 5 / f assess rsbi, neurostatus is improved and protects airway well, assess rsbi and extubation parameters, k is low, reaplce, no seziures overnight, maintain vanc  for MRSA PNA, dc zosyn, will need 10 days abx course, follow crt in am , I updated family in full, WUA assessment on going The patient is critically ill with multiple organ systems failure and requires high complexity decision making for assessment and support, frequent evaluation and titration of therapies, application of advanced monitoring technologies and extensive interpretation of multiple databases.   Critical Care Time devoted to patient care services described in this note is 35 Minutes. This time reflects time of care of this signee: Rory Percy, MD FACP. This critical care time does not reflect procedure time, or teaching time or supervisory time of PA/NP/Med student/Med Resident etc but could involve care discussion time. Rest per NP/medical resident whose note is outlined above and that I agree with   Mcarthur Rossetti. Tyson Alias, MD, FACP Pgr: [phone]  Pulmonary & Critical Care 06/27/2016 10:32 AM

## 2016-06-27 NOTE — Procedures (Signed)
Extubation Procedure Note  Patient Details:   Name: [name] DOB: [birth year] MRN: [id number]   Airway Documentation:  Airway 7.5 mm (Active)  Secured at (cm) 26 cm 06/27/2016  8:00 AM  Measured From Lips 06/27/2016  8:00 AM  Secured Location Right 06/27/2016  7:49 AM  Secured By [employer] Tube Holder 06/27/2016  8:00 AM  Tube Holder Repositioned Yes 06/27/2016  7:49 AM  Cuff Pressure (cm H2O) 26 cm H2O 06/27/2016  7:49 AM  Site Condition Dry 06/27/2016  8:00 AM    Evaluation  O2 sats: stable throughout and currently acceptable Complications: No apparent complications Patient did tolerate procedure well. Bilateral Breath Sounds: Rhonchi, Diminished   Yes  Antoine Pocherogdon, Retaj Hilbun Caroline 06/27/2016, 10:59 AM

## 2016-06-27 NOTE — Progress Notes (Signed)
Neurology Progress Note  Subjective: He has been seizure-free overnight. He failed his weaning attempt yesterday morning due to a period of apnea. He has been quite agitated and is now on propofol, fentanyl, and midazolam drips as a result. He developed some hypotension with sedation and was stared on a norepinephrine drip. He is not able to participate with the ROS or the exam due to his level of sedation.  Medications reviewed and reconciled.   Pertinent meds: lacosamide 200 mg q12h MVI Thiamine 100 mg daily Folate 1 mg daily Depacon 500 mg q8h Propofol drip 40 mcg/kg/min Midazolam drip 1 mg/hr Fentanyl drip 50 mcg/hr Norepinephrine drip 10 mcg/min  Current Meds:   Current Facility-Administered Medications:  .  0.9 %  sodium chloride infusion, , Intravenous, Continuous, Byrum, Rose Fillers, MD, Last Rate: 10 mL/hr at 06/27/16 0300 .  acetaminophen (TYLENOL) solution 650 mg, 650 mg, Per Tube, Q6H PRN, Javier Glazier, MD, 650 mg at 06/25/16 1511 .  acetylcysteine (MUCOMYST) 20 % nebulizer / oral solution 3 mL, 3 mL, Nebulization, BID, Magdalen Spatz, NP, 3 mL at 06/27/16 0749 .  albuterol (PROVENTIL) (2.5 MG/3ML) 0.083% nebulizer solution 2.5 mg, 2.5 mg, Nebulization, Q4H PRN, Sinda Du, MD, 2.5 mg at 06/26/16 2211 .  chlorhexidine gluconate (MEDLINE KIT) (PERIDEX) 0.12 % solution 15 mL, 15 mL, Mouth Rinse, BID, Sinda Du, MD, 15 mL at 06/26/16 2051 .  Chlorhexidine Gluconate Cloth 2 % PADS 6 each, 6 each, Topical, Daily, Javier Glazier, MD, 6 each at 06/26/16 1300 .  enoxaparin (LOVENOX) injection 40 mg, 40 mg, Subcutaneous, Q24H, Sood, Vineet, MD, 40 mg at 06/26/16 1004 .  feeding supplement (VITAL AF 1.2 CAL) liquid 1,000 mL, 1,000 mL, Per Tube, Continuous, Javier Glazier, MD, Last Rate: 75 mL/hr at 06/27/16 0300, 1,000 mL at 06/27/16 0300 .  fentaNYL (SUBLIMAZE) bolus via infusion 50 mcg, 50 mcg, Intravenous, Q1H PRN, Raylene Miyamoto, MD, 50 mcg at 06/27/16  0444 .  fentaNYL (SUBLIMAZE) injection 25-100 mcg, 25-100 mcg, Intravenous, Q1H PRN, Javier Glazier, MD, 100 mcg at 06/26/16 1022 .  fentaNYL 2569mg in NS 2540m(1039mml) infusion-PREMIX, 25-400 mcg/hr, Intravenous, Continuous, FeiRaylene MiyamotoD, Last Rate: 5 mL/hr at 06/27/16 0655, 50 mcg/hr at 06/27/16 0655 .  folic acid (FOLVITE) tablet 1 mg, 1 mg, Per Tube, Daily, NesJavier GlazierD, 1 mg at 06/26/16 1003 .  free water 300 mL, 300 mL, Per Tube, Q8H, FeiRaylene MiyamotoD, 300 mL at 06/27/16 0600 .  hydrALAZINE (APRESOLINE) injection 10 mg, 10 mg, Intravenous, Q6H PRN, KimJani GravelD, 10 mg at 06/25/16 0327 .  insulin aspart (novoLOG) injection 0-9 Units, 0-9 Units, Subcutaneous, Q4H, Opyd, TimIlene QuaD, 1 Units at 06/27/16 0445 .  ipratropium-albuterol (DUONEB) 0.5-2.5 (3) MG/3ML nebulizer solution 3 mL, 3 mL, Nebulization, TID, HawSinda DuD, 3 mL at 06/27/16 0749 .  lacosamide (VIMPAT) 200 mg in sodium chloride 0.9 % 25 mL IVPB, 200 mg, Intravenous, Q12H, EshRogue JuryD, Stopped at 06/27/16 0050 .  LORazepam (ATIVAN) injection 1-2 mg, 1-2 mg, Intravenous, Q15 min PRN, NesJavier GlazierD .  MEDLINE mouth rinse, 15 mL, Mouth Rinse, QID, HawSinda DuD, 15 mL at 06/27/16 0445 .  midazolam (VERSED) 50 mg in sodium chloride 0.9 % 50 mL (1 mg/mL) infusion, 0-10 mg/hr, Intravenous, Continuous, NesJavier GlazierD, Last Rate: 1 mL/hr at 06/27/16 0644, 1 mg/hr at 06/27/16 0644 .  midazolam (VERSED) bolus via infusion 1-2 mg, 1-2  mg, Intravenous, Q2H PRN, Javier Glazier, MD, 2 mg at 06/27/16 0445 .  multivitamin liquid 15 mL, 15 mL, Per Tube, Daily, Javier Glazier, MD, 15 mL at 06/26/16 1002 .  norepinephrine (LEVOPHED) 4 mg in dextrose 5 % 250 mL (0.016 mg/mL) infusion, 0-40 mcg/min, Intravenous, Titrated, de Dios, [place] A, MD, Last Rate: 37.5 mL/hr at 06/27/16 0655, 10 mcg/min at 06/27/16 0655 .  [DISCONTINUED] ondansetron (ZOFRAN) tablet 4 mg, 4  mg, Oral, Q6H PRN **OR** ondansetron (ZOFRAN) injection 4 mg, 4 mg, Intravenous, Q6H PRN, Opyd, Angelamarie Avakian S, MD .  pantoprazole sodium (PROTONIX) 40 mg/20 mL oral suspension 40 mg, 40 mg, Per Tube, Daily, Javier Glazier, MD, 40 mg at 06/26/16 1021 .  potassium chloride 20 MEQ/15ML (10%) solution 30 mEq, 30 mEq, Per Tube, Q4H, de Dios, Ionia A, MD, 30 mEq at 06/27/16 0600 .  propofol (DIPRIVAN) 1000 MG/100ML infusion, 0-50 mcg/kg/min, Intravenous, Continuous, Sinda Du, MD, Last Rate: 22.5 mL/hr at 06/27/16 0644, 40 mcg/kg/min at 06/27/16 0644 .  thiamine (VITAMIN B-1) tablet 100 mg, 100 mg, Per Tube, Daily, Javier Glazier, MD, 100 mg at 06/26/16 1004 .  valproate (DEPACON) 500 mg in dextrose 5 % 50 mL IVPB, 500 mg, Intravenous, Q8H, Phillips Odor, MD, Stopped at 06/27/16 3419 .  vancomycin (VANCOCIN) IVPB 1000 mg/200 mL premix, 1,000 mg, Intravenous, Q8H, Sinda Du, MD, Stopped at 06/27/16 3790  Objective:  Temp:  [97.2 F (36.2 C)-99.9 F (37.7 C)] 99.9 F (37.7 C) (05/16 0600) Pulse Rate:  [64-111] 79 (05/16 0600) Resp:  [14-29] 16 (05/16 0600) BP: (72-174)/(36-115) 104/67 (05/16 0654) SpO2:  [95 %-100 %] 99 % (05/16 0749) FiO2 (%):  [30 %] 30 % (05/16 0749) Weight:  [95.9 kg (211 lb 6.7 oz)] 95.9 kg (211 lb 6.7 oz) (05/16 0458)  General: WDWN Caucasian man lying in bed. He is intubated and sedated. He will open his eyes with verbal and tactile stimulation. He briefly fixes but does not clearly track. He is not following any commands for me right now.  HEENT: Neck is supple without lymphadenopathy. ETT, OGT in place. Sclerae are anicteric. There is mild conjunctival injection.  CV: Regular, no murmur. Carotid pulses are 2+ and symmetric with no bruits. Distal pulses 2+ and symmetric.  Lungs: Coarse breath sounds bilaterally. Ventilated.  Extremities: No C/C/E. Neuro: MS: As noted above.  CN: Pupils are equal and reactive from 3-->2 mm bilaterally. He does not  clearly blink to visual threat. His eyes are conjugate. No forced deviation and no nystagmus. Corneals are intact and symmetric. His face appears to be symmetric at rest with normal strength but is partly obscured by tubes and tape. Bilateral SCMs 5/5. He has a spontaneous cough.Tongue protrudes to midline around his endotracheal tube. The remainder of his cranial nerves cannot be accurately assessed as he is intubated.  Motor: Normal bulk, tone. He does not participate with confrontational strength testing. He shivers intermittently.  Sensation: He withdraws from noxious stimulation to all extremities.  DTRs: 3+, symmetric. He has about five beats of clonus at the right ankle. Toes are mute bilaterally. Coordination: No overt dysmetria with movements of his upper extremities.  Labs: Lab Results  Component Value Date   WBC 9.3 06/27/2016   HGB 10.8 (L) 06/27/2016   HCT 32.2 (L) 06/27/2016   PLT 319 06/27/2016   GLUCOSE 177 (H) 06/27/2016   TRIG 101 06/25/2016   ALT 15 (L) 06/23/2016   AST 25 06/23/2016  NA 139 06/27/2016   K 3.1 (L) 06/27/2016   CL 105 06/27/2016   CREATININE 0.73 06/27/2016   BUN 17 06/27/2016   CO2 27 06/27/2016   TSH 1.197 06/12/2016   INR 0.95 06/12/2016   HGBA1C 5.3 06/12/2016   CBC Latest Ref Rng & Units 06/27/2016 06/26/2016 06/25/2016  WBC 4.0 - 10.5 K/uL 9.3 8.5 11.2(H)  Hemoglobin 13.0 - 17.0 g/dL 10.8(L) 10.9(L) 11.9(L)  Hematocrit 39.0 - 52.0 % 32.2(L) 32.8(L) 35.4(L)  Platelets 150 - 400 K/uL 319 296 280    Lab Results  Component Value Date   HGBA1C 5.3 06/12/2016   Lab Results  Component Value Date   ALT 15 (L) 06/23/2016   AST 25 06/23/2016   ALKPHOS 48 06/23/2016   BILITOT 1.1 06/23/2016   VPA level 5/15 56  Radiology:  There is no new neuroimaging today.  Other diagnostic studies:  EEG from 06/23/16: Normal awake and sleep EEG.   A/P:   1. 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 His last seizures were on 5/11 and were most likely provoked by hypoxia and possible sepsis. Continue VPA and lacosamide at current doses. VPA level stable. Can likely transition back to his outpatient regimen of Depakote and Topamax once he is extubated and able to tolerate oral medications. Continue to treat underlying infection and optimize metabolic status as you are. Seizure precautions.   2. Acute encephalopathy: This is most likely multifactorial in etiology with potential contributions from sepsis, hypoxia, hypotension, postictal state, medication effect. Today he is more sedated. Continue to optimize metabolic status and treat any underlying infection. Minimize the use of opiates, benzos or any medication with strong anticholinergic properties as much as possible. Optimize sleep-wake cycles as much as you can by keeping the room bright with activity during the day and dark and quiet at night. For agitation, recommend low-dose haloperidol or an atypical antipsychotic.   3. TBI: This is remote, no acute issues. Follow. Unclear what his underlying cognitive and neuropsychiatric baseline is at this time.   No family present at the bedside at the time of my visit.   Neurology will continue to follow. Please call with any urgent questions or concerns.   Melba Coon, MD [facility]

## 2016-06-27 NOTE — Progress Notes (Signed)
Wasted Fentanyl IV 175ml with Lavenia AtlasHaleigh D'Adamo RN  Carlyle DollyJohnyRN

## 2016-06-27 NOTE — Progress Notes (Signed)
[facility] LAB REPLACEMENT ONLY  The patient does [facility] Protocol based on the criteria listed below:   1. Is GFR >/= 40 ml/min? Yes.    Patient's GFR today is >60 2. Is urine output >/= 0.5 ml/kg/hr for the last 6 hours? Yes.   Patient's UOP is 0.81 ml/kg/hr 3. Is BUN < 60 mg/dL? Yes.    Patient's BUN today is 17 4. Abnormal electrolyte(s): Potassium 3.1 5. Ordered repletion with: Potassium per Protocol 6. If a panic level lab has been reported, has the CCM MD in charge been notified? No..   Physician:    Thomasenia BottomsLANTZY, Alenah Sarria P 06/27/2016 5:33 AM

## 2016-06-27 NOTE — Progress Notes (Signed)
Wasted Versed 30mg  in sink witnessed with Philippa SicksMelinda Macasero RN.  Fuller CanadaJohny, RN

## 2016-06-27 NOTE — Care Management Note (Signed)
Case Management Note  Patient Details  Name: Roselle LocusSteven L Depree MRN: [id number] Date of Birth: 07-02-1965  Subjective/Objective:     [facility] transferred from [facility] with seizures post ETOH  - pt had to intubated/extubated and now reintubated - now possibly septic        Action/Plan:   PTA ETOH abuse - CSW consulted.  No family at bedside   Expected Discharge Date:  06/15/16               Expected Discharge Plan:     In-House Referral:     Discharge planning Services  CM Consult  Post Acute Care Choice:    Choice offered to:     DME Arranged:    DME Agency:     HH Arranged:    HH Agency:     Status of Service:     If discussed at [employer] Meetings, dates discussed:    Additional Comments: 06/27/2016 Pt is now extubated but confused.  Family at bedside - father spoke with CM and informed her that pt no longer has a home to go to at discharge as his roommate kicked him out  - father requested resources - CSW consulted for homelessness  Cherylann ParrClaxton, Cutter Passey S, RN 06/27/2016, 11:57 AM

## 2016-06-28 ENCOUNTER — Inpatient Hospital Stay (HOSPITAL_COMMUNITY): Payer: Medicaid Other

## 2016-06-28 LAB — CBC
HCT: 32.7 % — ABNORMAL LOW (ref 39.0–52.0)
Hemoglobin: 10.8 g/dL — ABNORMAL LOW (ref 13.0–17.0)
MCH: 31.2 pg (ref 26.0–34.0)
MCHC: 33 g/dL (ref 30.0–36.0)
MCV: 94.5 fL (ref 78.0–100.0)
PLATELETS: 323 10*3/uL (ref 150–400)
RBC: 3.46 MIL/uL — AB (ref 4.22–5.81)
RDW: 12.4 % (ref 11.5–15.5)
WBC: 10.3 10*3/uL (ref 4.0–10.5)

## 2016-06-28 LAB — CULTURE, BLOOD (ROUTINE X 2)
CULTURE: NO GROWTH
Culture: NO GROWTH

## 2016-06-28 LAB — BASIC METABOLIC PANEL
Anion gap: 9 (ref 5–15)
BUN: 12 mg/dL (ref 6–20)
CHLORIDE: 105 mmol/L (ref 101–111)
CO2: 27 mmol/L (ref 22–32)
CREATININE: 0.69 mg/dL (ref 0.61–1.24)
Calcium: 8.4 mg/dL — ABNORMAL LOW (ref 8.9–10.3)
GFR calc Af Amer: >60 mL/min (ref >60)
GFR calc non Af Amer: >60 mL/min (ref >60)
Glucose, Bld: 115 mg/dL — ABNORMAL HIGH (ref 65–99)
Potassium: 3.3 mmol/L — ABNORMAL LOW (ref 3.5–5.1)
Sodium: 141 mmol/L (ref 135–145)

## 2016-06-28 LAB — PHOSPHORUS: Phosphorus: 3 mg/dL (ref 2.5–4.6)

## 2016-06-28 LAB — GLUCOSE, CAPILLARY
GLUCOSE-CAPILLARY: 89 mg/dL (ref 65–99)
GLUCOSE-CAPILLARY: 89 mg/dL (ref 65–99)
Glucose-Capillary: 109 mg/dL — ABNORMAL HIGH (ref 65–99)

## 2016-06-28 LAB — MAGNESIUM: Magnesium: 2.1 mg/dL (ref 1.7–2.4)

## 2016-06-28 MED ORDER — BOOST / RESOURCE BREEZE PO LIQD
1.0000 | Freq: Three times a day (TID) | ORAL | Status: DC
  Administered 2016-06-28 – 2016-07-01 (×8): 1 via ORAL
  Filled 2016-06-28 (×6): qty 1

## 2016-06-28 MED ORDER — METOPROLOL TARTRATE 12.5 MG HALF TABLET
12.5000 mg | ORAL_TABLET | Freq: Two times a day (BID) | ORAL | Status: DC
  Administered 2016-06-28 – 2016-07-01 (×7): 12.5 mg via ORAL
  Filled 2016-06-28 (×7): qty 1

## 2016-06-28 MED ORDER — POTASSIUM CHLORIDE 10 MEQ/50ML IV SOLN
10.0000 meq | Freq: Once | INTRAVENOUS | Status: AC
  Administered 2016-06-28: 10 meq via INTRAVENOUS

## 2016-06-28 MED ORDER — POTASSIUM CHLORIDE 10 MEQ/50ML IV SOLN
10.0000 meq | INTRAVENOUS | Status: AC
  Administered 2016-06-28: 10 meq via INTRAVENOUS
  Filled 2016-06-28 (×2): qty 50

## 2016-06-28 MED ORDER — SODIUM CHLORIDE 0.9 % IV SOLN: 100.0000 mg | Freq: Two times a day (BID) | INTRAVENOUS | Status: DC

## 2016-06-28 MED ORDER — IPRATROPIUM-ALBUTEROL 0.5-2.5 (3) MG/3ML IN SOLN: 3.0000 mL | Freq: Four times a day (QID) | RESPIRATORY_TRACT | Status: DC | PRN

## 2016-06-28 MED ORDER — SODIUM CHLORIDE 0.9 % IV SOLN
100.0000 mg | Freq: Two times a day (BID) | INTRAVENOUS | Status: AC
  Administered 2016-06-28 (×2): 100 mg via INTRAVENOUS
  Filled 2016-06-28 (×3): qty 10

## 2016-06-28 MED ORDER — PAROXETINE HCL 20 MG PO TABS
20.0000 mg | ORAL_TABLET | Freq: Every day | ORAL | Status: DC
  Administered 2016-06-28 – 2016-07-01 (×4): 20 mg via ORAL
  Filled 2016-06-28 (×4): qty 1

## 2016-06-28 MED ORDER — DEXTROMETHORPHAN POLISTIREX ER 30 MG/5ML PO SUER
30.0000 mg | Freq: Four times a day (QID) | ORAL | Status: DC | PRN
  Administered 2016-06-28: 30 mg via ORAL
  Filled 2016-06-28 (×2): qty 5

## 2016-06-28 NOTE — Progress Notes (Signed)
[facility] FOR [facility] LAB REPLACEMENT ONLY  The [facility] does [facility] based on the criteria listed below:   1. Is GFR >/= 40 ml/min? Yes.    Patient's GFR today is >60 2. Is urine output >/= 0.5 ml/kg/hr for the last 6 hours? Yes.   Patient's UOP is 0.67 ml/kg/hr 3. Is BUN < 60 mg/dL? Yes.    Patient's BUN today is 12 4. Abnormal electrolyte(s): Potassium 3.3 5. Ordered repletion with: Potassium per protocol 6. If a panic level lab has been reported, has the CCM MD in charge been notified? No..   Physician:    Thomasenia BottomsLANTZY, Katelin Kutsch P 06/28/2016 6:47 AM
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
## 2016-06-28 NOTE — Progress Notes (Addendum)
PCCM Progress Note  Admission date: 06/12/2016 Consult date: 06/23/2016 Referring provider: Dr. Juanetta GoslingHawkins, [facility]  CC: Seizure  HPI: 51 yo [facility] from jail.  Uncertain if he was taking topamax, depakote.  He had alcohol binge and developed generalized seizure.  He required intubation for airway protection.  He was extubated.  He developed progressive confusion and agitation, and required reintubation for airway protection after developing VT from haldol. Agitation is an issue with care and ability to wean.  Subjective: Extubated, no distress.  Vital signs: BP (!) 151/78   Pulse (!) 103   Temp 98.2 F (36.8 C) (Oral)   Resp 18   Ht 6\' 2"  (1.88 m)   Wt 97.3 kg (214 lb 8.1 oz)   SpO2 96%   BMI 27.54 kg/m   Intake/output: I/O last 3 completed shifts: In: 3868.9 [P.O.:100; I.V.:1158.9; NG/GT:1200; IV Piggyback:1410] Out: 2115 [Urine:2115]  General: awake, alert, looks great Neuro: nonfocal, alert, awake HEENT: jvd wnl, ett gone PULM: scattered ronchi CV: s1 s2 RRR GI: soft, bs wnl Extremities: no edema    CMP Latest Ref Rng & Units 06/28/2016 06/27/2016 06/26/2016  Glucose 65 - 99 mg/dL 409(W115(H) 119(J177(H) 478(G111(H)  BUN 6 - 20 mg/dL 12 17 14   Creatinine 0.61 - 1.24 mg/dL 9.560.69 2.130.73 0.860.70  Sodium 135 - 145 mmol/L 141 139 146(H)  Potassium 3.5 - 5.1 mmol/L 3.3(L) 3.1(L) 3.1(L)  Chloride 101 - 111 mmol/L 105 105 108  CO2 22 - 32 mmol/L 27 27 31   Calcium 8.9 - 10.3 mg/dL 5.7(Q8.4(L) 7.8(L) 8.5(L)  Total Protein 6.5 - 8.1 g/dL - - -  Total Bilirubin 0.3 - 1.2 mg/dL - - -  Alkaline Phos 38 - 126 U/L - - -  AST 15 - 41 U/L - - -  ALT 17 - 63 U/L - - -     CBC Latest Ref Rng & Units 06/28/2016 06/27/2016 06/26/2016  WBC 4.0 - 10.5 K/uL 10.3 9.3 8.5  Hemoglobin 13.0 - 17.0 g/dL 10.8(L) 10.8(L) 10.9(L)  Hematocrit 39.0 - 52.0 % 32.7(L) 32.2(L) 32.8(L)  Platelets 150 - 400 K/uL 323 319 296     ABG    Component Value Date/Time   PHART  7.521 (H) 06/24/2016 0430   PCO2ART 32.7 06/24/2016 0430   PO2ART 75.3 (L) 06/24/2016 0430   HCO3 27.0 06/24/2016 0430   TCO2 28 06/12/2016 1218   ACIDBASEDEF 1.9 06/12/2016 1505   O2SAT 96.3 06/24/2016 0430     CBG (last 3)   Recent Labs  06/27/16 2320 06/28/16 0411 06/28/16 0725  GLUCAP 107* 89 89     Imaging: Dg Chest Port 1 View  Result Date: 06/28/2016 CLINICAL DATA:  Respiratory failure. EXAM: PORTABLE CHEST 1 VIEW COMPARISON:  06/27/2016. FINDINGS: Interim extubation and removal of NG tube. Right PICC line stable position. Heart size normal persistent mild right base infiltrate. No pleural effusion or pneumothorax. IMPRESSION: 1. Interim extubation and removal of NG tube. Right PICC line stable position. 2. Persistent mild right base infiltrate. Electronically Signed   By: Maisie Fushomas  Register   On: 06/28/2016 06:37   Dg Chest Port 1 View  Result Date: 06/27/2016 CLINICAL DATA:  Ventilator support.  Respiratory failure. EXAM: PORTABLE CHEST 1 VIEW COMPARISON:  06/26/2016 FINDINGS: Endotracheal tube tip is 3 cm above the carina. Nasogastric tube enters stomach. Right arm PICC tip is at the SVC RA junction. There is improving infiltrate/atelectasis in both lower lobes right more than left. No  worsening or new findings. IMPRESSION: Lines and tubes well positioned. Improving infiltrate/ atelectasis in the lower lobes. Electronically Signed   By: Paulina Fusi M.D.   On: 06/27/2016 07:20     Studies: UDS 5/1:  Benzodiazepines EEG 5/2:  This recording of the awake and sleep states shows moderate global slowing. However, there is no epileptiform activity is observed. CT HEAD W/O 5/1:  No acute intracranial abnormality. CT HEAD W/O 5/11:  No acute intracranial findings. TTE 5/11:  LV not well visualized. EF 60-65%. Unable to assess LV wall motion or diastolic dysfunction. LA normal in size. RV poorly visualized but appeared grossly normal in size and function. Aortic valve not well  visualized but without significant stenosis or regurgitation. Aorta not well visualized. Mitral valve not well visualized but no grossly significant regurgitation or stenosis. Pulmonic valve not well visualized. Tricuspid valve not well visualized but without significant stenosis or regurgitation. No pericardial effusion.  EEG 5/12 >>> normal EEG in the awake and sleep states.  Antibiotics: Zosyn 5/2 >>>5/14 Vancomycin 5/12 >>>stop 22  Cultures: MRSA PCR 5/1:  Negative HIV 5/1:  Nonreactive  Blood Cultures x2 5/12 >>> Urine Culture 5/12 >>> negative Tracheal Aspirate Culture 5/12 >>> Staph aureus >>>MRSA  Lines/tubes: ETT 5/01 >> 5/01 ETT 5/11 >> Rt arm PICC 5/10 >>  Events: 05/01 - Admit to [facility] with seizures 05/02 - Valproate started 05/04 - Started on Keppra 05/10 - V tach w/ Haldol & Hypokalemia 05/11 - Code Blue w/ seizuing & hypoxia but no cardiac arrest >> reintubated by [facility] EDP 05/12 - Transfer to [facility] w/ re-intubation & septic shock 05/15 - Continued agitation requiring propofol, fentanyl and versed to manage 5/16 heavily sedated and on pressor support.  Assessment/plan:  Acute hypoxic respiratory failure with compromised airway. Hx of COPD. Tobacco abuse. Thick secretions, plugging Plan: -Extubated, doing well coughing well -dc mucomysts, albuteral mobilize abx x stop 22  HCAP with MRSA in sputum culture. 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 - per neuro to dc vimpat,  -depacon per neurology - continue folic acid, thiamine, MVI - Pt consult  VT in setting of haldol, hypokalemia >> resolved. -keep tele\ No haldol K supp aggressive Add diet   Hypokalemia.  Recent Labs Lab 06/26/16 0424 06/27/16 0450 06/28/16 0443  K 3.1* 3.1* 3.3*   - k supp kvo  Anemia  Recent Labs  06/27/16 0450 06/28/16 0443  HGB 10.8* 10.8*    No obvious source of bleeding Plan: -  limit phelobotmy If ambulate dc lovenox  Hyperglycemia resolved CBG (last 3)   Recent Labs  06/27/16 2320 06/28/16 0411 06/28/16 0725  GLUCAP 107* 89 89     - SSI - dc - CBG q 4  Agitation/sedated - resolved Add home paxil  Keep tele   Mcarthur Rossetti. Tyson Alias, MD, FACP Pgr: [phone] [facility] 06/28/2016 11:23 AM

## 2016-06-28 NOTE — Progress Notes (Signed)
Patient arrived to room from 20M. Safety precautions and orders reviewed. TELE applied and confirmed. VSS. No other distress noted. Will continue to monitor.   Sim BoastHavy, RN

## 2016-06-28 NOTE — Progress Notes (Signed)
Nutrition Follow-up  DOCUMENTATION CODES:   Severe malnutrition in context of acute illness/injury  INTERVENTION:   Boost Breeze po TID, each supplement provides 250 kcal and 9 grams of protein  NUTRITION DIAGNOSIS:   Malnutrition (Severe) related to lethargy/confusion, inability to eat as evidenced by energy intake < or equal to 50% for > or equal to 5 days, percent weight loss.  Ongoing  GOAL:   Patient will meet greater than or equal to 90% of their needs  Unmet  MONITOR:   Diet advancement, PO intake, Labs  ASSESSMENT:   51 y/o male PMHx TBI, Chronic Pain, seizure disorder, COPD. Initially presented to ED after experiencing significant seizures. Was briefly intubated in the ED for respiratory failure, self extubated shortly after. Has been maintained on Bipap and sedation majority of admission for AMS/Combativeness.   Patient was extubated on 5/16. Being treated for PNA. Diet has been advanced to clear liquids.  Will monitor diet advancement, expect PO intake will not be adequate to meet nutrition needs; will add PO supplements.  Diet Order:  Diet clear liquid Room service appropriate? Yes; Fluid consistency: Thin  Skin:  Reviewed, no issues  Last BM:  5/16  Height:   Ht Readings from Last 1 Encounters:  06/23/16 6\' 2"  (1.88 m)    Weight:   Wt Readings from Last 1 Encounters:  06/28/16 214 lb 8.1 oz (97.3 kg)    Ideal Body Weight:  86.36 kg  BMI:  Body mass index is 27.54 kg/m.  Estimated Nutritional Needs:   Kcal:  2400-2600  Protein:  100-115 gm  Fluid:  2.4-2.6 L  EDUCATION NEEDS:   No education needs identified at this time  Joaquin CourtsKimberly Alfa Leibensperger, RD, LDN, CNSC Pager [phone] After Hours Pager [phone]

## 2016-06-28 NOTE — Progress Notes (Signed)
CSW consulted for possible homelessness and substance abuse. Pt acknowledged current homelessness because his landlord kicked him out. Pt discussed starting a disability application, but it is taking some time to go through. Pt discussed how he is hopeful that once the disability comes through, then he'll be able to afford a place to stay. CSW attempted to explain how there needed to be a back-up plan in place because his disability would take time to process, and pt was having trouble understanding. Pt started repeating information about his disability and making some comments that did not seem to make sense to the discussion. Pt explained that he was having difficulty processing information after his seizure, and he wasn't understanding what CSW was in the room to talk about.   CSW will follow and attempt to provide resources once pt's confusion improves.   Blenda Nicelylizabeth Priyansh Pry LCSW [phone]

## 2016-06-29 DIAGNOSIS — E43 Unspecified severe protein-calorie malnutrition: Secondary | ICD-10-CM

## 2016-06-29 LAB — BASIC METABOLIC PANEL
ANION GAP: 9 (ref 5–15)
BUN: 9 mg/dL (ref 6–20)
CALCIUM: 8.5 mg/dL — AB (ref 8.9–10.3)
CO2: 26 mmol/L (ref 22–32)
Chloride: 106 mmol/L (ref 101–111)
Creatinine, Ser: 0.75 mg/dL (ref 0.61–1.24)
GFR calc Af Amer: >60 mL/min (ref >60)
Glucose, Bld: 151 mg/dL — ABNORMAL HIGH (ref 65–99)
POTASSIUM: 3.2 mmol/L — AB (ref 3.5–5.1)
SODIUM: 141 mmol/L (ref 135–145)

## 2016-06-29 LAB — VANCOMYCIN, TROUGH: VANCOMYCIN TR: 14 ug/mL — AB (ref 15–20)

## 2016-06-29 LAB — MAGNESIUM: Magnesium: 2.1 mg/dL (ref 1.7–2.4)

## 2016-06-29 LAB — PHOSPHORUS: PHOSPHORUS: 3.3 mg/dL (ref 2.5–4.6)

## 2016-06-29 MED ORDER — ADULT MULTIVITAMIN W/MINERALS CH
1.0000 | ORAL_TABLET | Freq: Every day | ORAL | Status: DC
  Administered 2016-06-30: 1 via ORAL
  Filled 2016-06-29: qty 1

## 2016-06-29 MED ORDER — DIVALPROEX SODIUM 500 MG PO DR TAB
500.0000 mg | DELAYED_RELEASE_TABLET | Freq: Three times a day (TID) | ORAL | Status: DC
  Administered 2016-06-29 – 2016-07-01 (×6): 500 mg via ORAL
  Filled 2016-06-29 (×6): qty 1

## 2016-06-29 MED ORDER — FOLIC ACID 1 MG PO TABS
1.0000 mg | ORAL_TABLET | Freq: Every day | ORAL | Status: DC
  Administered 2016-06-30 – 2016-07-01 (×2): 1 mg via ORAL
  Filled 2016-06-29 (×2): qty 1

## 2016-06-29 MED ORDER — VITAMIN B-1 100 MG PO TABS
100.0000 mg | ORAL_TABLET | Freq: Every day | ORAL | Status: DC
  Administered 2016-06-30 – 2016-07-01 (×2): 100 mg via ORAL
  Filled 2016-06-29 (×2): qty 1

## 2016-06-29 MED ORDER — POTASSIUM CHLORIDE CRYS ER 20 MEQ PO TBCR
40.0000 meq | EXTENDED_RELEASE_TABLET | Freq: Once | ORAL | Status: AC
  Administered 2016-06-29: 40 meq via ORAL
  Filled 2016-06-29: qty 2
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
## 2016-06-29 NOTE — Progress Notes (Signed)
PROGRESS NOTE    [name]  MCE:[id number] DOB: [birth year] DOA: 06/12/2016 PCP: Sinda Du, MD   Brief Narrative: [name] is a 51 y.o. male with a history of alcohol abuse, COPD, TBI. He presented with seizures. He required mechanical ventilation and was extubated on 5/16. Seizures managed with AEDs.   Assessment & Plan:   Principal Problem:   Seizures ([facility]) Active Problems:   Alcohol abuse with intoxication ([place])   COPD (chronic obstructive pulmonary disease) ([facility])   History of traumatic brain injury   Endotracheally intubated   Respiratory failure ([facility])   Aspiration pneumonia ([facility])   Metabolic encephalopathy   Protein-calorie malnutrition, severe   Seizures Secondary to alcohol. Currently on valproate. No seizures. Vimpat discontinued. -Ativan prn for seizures -neurology recommendations: valproate  MRSA pneumonia Stable. Afebrile -continue Vancomycin; d/c PICC when complete  Alcohol abuse Passed window for alcohol withdrawal. -continue thiamine -continue multivitamin  History of traumatic brain injury Unsure of patient's baseline functioning. Homeless.  Protein-calorie malnutrition -continue supplement  COPD Not in exacerbation -continue Delsym prn -continue Duoneb prn  DVT prophylaxis: Lovenox Code Status: Full code Family Communication: None at bedside Disposition Plan: Discharge pending medical stability   Consultants:   Neurology  Procedures:   ETT (5/11 - 5/16)  Antimicrobials:  Zosyn (5/2>>5/14)  Vancomycin (5/11>>   Subjective: Patient reports no chest pain or dyspnea. No seizures overnight.  Objective: Vitals:   06/28/16 2154 06/29/16 0019 06/29/16 0621 06/29/16 0712  BP: (!) 148/88 (!) 148/87 (!) 169/102 (!) 156/76  Pulse: 93 88 94 85  Resp: _0 Temp: 98.1 F (36.7 C) 99.1 F (37.3 C) 98.6 F (37 C)   TempSrc: Oral Oral Oral   SpO2: 97% 97% 96%   Weight:      Height:        Intake/Output  Summary (Last 24 hours) at 06/29/16 0717 Last data filed at 06/29/16 0622  Gross per 24 hour  Intake              655 ml  Output             1920 ml  Net            -1265 ml   Filed Weights   06/26/16 0500 06/27/16 0458 06/28/16 0400  Weight: 94.3 kg (207 lb 14.3 oz) 95.9 kg (211 lb 6.7 oz) 97.3 kg (214 lb 8.1 oz)    Examination:  General exam: Appears calm and comfortable Respiratory system: Clear to auscultation. Respiratory effort normal. Cardiovascular system: S1 & S2 heard, RRR. No murmurs, rubs, gallops or clicks. Gastrointestinal system: Abdomen is nondistended, soft and nontender. Normal bowel sounds heard. Central nervous system: Alert and oriented to person and place. No focal neurological deficits. Extremities: No edema. No calf tenderness Skin: No cyanosis. Psychiatry: Judgement and insight appear normal. Mood & affect withdrawn.     Data Reviewed: I have personally reviewed following labs and imaging studies  CBC:  Recent Labs Lab 06/22/16 1125  06/23/16 0515 06/24/16 0549 06/25/16 0320 06/26/16 0424 06/27/16 0450 06/28/16 0443  WBC 12.9*  < > 23.0* 11.3* 11.2* 8.5 9.3 10.3  NEUTROABS 10.1*  --  19.4* 8.6* 8.0*  --   --   --   HGB 14.6  < > 14.7 10.7* 11.9* 10.9* 10.8* 10.8*  HCT 42.2  < > 41.8 32.1* 35.4* 32.8* 32.2* 32.7*  MCV 93.2  < > 93.7 94.4 94.1 95.9 97.3 94.5  PLT 210  < >  234 181 280 296 319 323  < > = values in this interval not displayed. Basic Metabolic Panel:  Recent Labs Lab 06/23/16 2300 06/24/16 0549 06/25/16 0320 06/26/16 0424 06/27/16 0450 06/28/16 0443  NA 141 141 145 146* 139 141  K 2.8* 3.1* 3.0* 3.1* 3.1* 3.3*  CL 107 108 107 108 105 105  CO2 _0 GLUCOSE 119* 119* 126* 111* 177* 115*  BUN _1 CREATININE 0.84 0.75 0.74 0.70 0.73 0.69  CALCIUM 8.2* 8.1* 8.5* 8.5* 7.8* 8.4*  MG 2.1 1.9 2.1 2.2  --  2.1  PHOS  --  2.0* 2.9  --   --  3.0   GFR: Estimated Creatinine Clearance: 128.4 mL/min  (by C-G formula based on SCr of 0.69 mg/dL). Liver Function Tests:  Recent Labs Lab 06/22/16 1125 06/23/16 0515 06/24/16 0549 06/25/16 0320  AST 22 25  --   --   ALT 18 15*  --   --   ALKPHOS 54 48  --   --   BILITOT 0.7 1.1  --   --   PROT 7.4 7.3  --   --   ALBUMIN 3.1* 3.0* 2.2* 2.4*   No results for input(s): LIPASE, AMYLASE in the last 168 hours. No results for input(s): AMMONIA in the last 168 hours. Coagulation Profile: No results for input(s): INR, PROTIME in the last 168 hours. Cardiac Enzymes:  Recent Labs Lab 06/22/16 1125  TROPONINI 0.05*   BNP (last 3 results) No results for input(s): PROBNP in the last 8760 hours. HbA1C: No results for input(s): HGBA1C in the last 72 hours. CBG:  Recent Labs Lab 06/27/16 1931 06/27/16 2320 06/28/16 0411 06/28/16 0725 06/28/16 1138  GLUCAP 119* 107* 89 89 109*   Lipid Profile: No results for input(s): CHOL, HDL, LDLCALC, TRIG, CHOLHDL, LDLDIRECT in the last 72 hours. Thyroid Function Tests: No results for input(s): TSH, T4TOTAL, FREET4, T3FREE, THYROIDAB in the last 72 hours. Anemia Panel: No results for input(s): VITAMINB12, FOLATE, FERRITIN, TIBC, IRON, RETICCTPCT in the last 72 hours. Sepsis Labs:  Recent Labs Lab 06/23/16 1218 06/23/16 1424 06/23/16 2300 06/24/16 0549 06/25/16 0320  PROCALCITON 0.11  --   --  <0.10 <0.10  LATICACIDVEN  --  1.0 0.9 0.7  --     Recent Results (from the past 240 hour(s))  Culture, blood (Routine X 2) w Reflex to ID Panel     Status: None   Collection Time: 06/23/16  2:11 AM  Result Value Ref Range Status   Specimen Description A-LINE  Final   Special Requests   Final    BOTTLES DRAWN AEROBIC AND ANAEROBIC Blood Culture results may not be optimal due to an inadequate volume of blood received in culture bottles DRAWN BY RN   Culture NO GROWTH 5 DAYS  Final   Report Status 06/28/2016 FINAL  Final  Culture, blood (Routine X 2) w Reflex to ID Panel     Status: None    Collection Time: 06/23/16  2:11 AM  Result Value Ref Range Status   Specimen Description LEFT ANTECUBITAL  Final   Special Requests   Final    BOTTLES DRAWN AEROBIC AND ANAEROBIC Blood Culture results may not be optimal due to an inadequate volume of blood received in culture bottles   Culture NO GROWTH 5 DAYS  Final   Report Status 06/28/2016 FINAL  Final  Urine culture     Status: None  Collection Time: 06/23/16  1:09 PM  Result Value Ref Range Status   Specimen Description URINE, RANDOM  Final   Special Requests NONE  Final   Culture NO GROWTH  Final   Report Status 06/24/2016 FINAL  Final  Culture, respiratory (NON-Expectorated)     Status: None   Collection Time: 06/23/16  4:28 PM  Result Value Ref Range Status   Specimen Description TRACHEAL ASPIRATE  Final   Special Requests Normal  Final   Gram Stain   Final    MODERATE WBC PRESENT, PREDOMINANTLY PMN RARE RARE GRAM POSITIVE COCCI IN PAIRS IN CLUSTERS    Culture FEW METHICILLIN RESISTANT STAPHYLOCOCCUS AUREUS  Final   Report Status 06/26/2016 FINAL  Final   Organism ID, Bacteria METHICILLIN RESISTANT STAPHYLOCOCCUS AUREUS  Final      Susceptibility   Methicillin resistant staphylococcus aureus - MIC*    CIPROFLOXACIN 1 SENSITIVE Sensitive     ERYTHROMYCIN >=8 RESISTANT Resistant     GENTAMICIN <=0.5 SENSITIVE Sensitive     OXACILLIN >=4 RESISTANT Resistant     TETRACYCLINE <=1 SENSITIVE Sensitive     VANCOMYCIN 1 SENSITIVE Sensitive     TRIMETH/SULFA <=10 SENSITIVE Sensitive     CLINDAMYCIN <=0.25 SENSITIVE Sensitive     RIFAMPIN <=0.5 SENSITIVE Sensitive     Inducible Clindamycin NEGATIVE Sensitive     * FEW METHICILLIN RESISTANT STAPHYLOCOCCUS AUREUS         Radiology Studies: Dg Chest Port 1 View  Result Date: 06/28/2016 CLINICAL DATA:  Respiratory failure. EXAM: PORTABLE CHEST 1 VIEW COMPARISON:  06/27/2016. FINDINGS: Interim extubation and removal of NG tube. Right PICC line stable position. Heart size  normal persistent mild right base infiltrate. No pleural effusion or pneumothorax. IMPRESSION: 1. Interim extubation and removal of NG tube. Right PICC line stable position. 2. Persistent mild right base infiltrate. Electronically Signed   By: Marcello Moores  Register   On: 06/28/2016 06:37        Scheduled Meds: . chlorhexidine gluconate (MEDLINE KIT)  15 mL Mouth Rinse BID  . Chlorhexidine Gluconate Cloth  6 each Topical Daily  . enoxaparin (LOVENOX) injection  40 mg Subcutaneous Q24H  . feeding supplement  1 Container Oral TID BM  . folic acid  1 mg Per Tube Daily  . mouth rinse  15 mL Mouth Rinse QID  . metoprolol tartrate  12.5 mg Oral BID  . multivitamin  15 mL Per Tube Daily  . PARoxetine  20 mg Oral Daily  . thiamine  100 mg Per Tube Daily   Continuous Infusions: . sodium chloride 10 mL/hr at 06/27/16 0300  . valproate sodium 500 mg (06/29/16 0647)  . vancomycin Stopped (06/29/16 1941)     LOS: 17 days     Cordelia Poche, MD [facility] 06/29/2016, 7:17 AM Pager: [phone]  If 7PM-7AM, please contact night-coverage [email or web address] Password [facility] 06/29/2016, 7:17 AM

## 2016-06-29 NOTE — Progress Notes (Signed)
Pt is confused and has pulled out picc line. IV team was notified. MD notified.

## 2016-06-29 NOTE — Progress Notes (Addendum)
Pharmacy Antibiotic Note  Roselle LocusSteven L Ginyard is a 51 y.o. male admitted on 06/12/2016 with MRSA pneumonia.  Pharmacy has been consulted for Vancomycin dosing- day #6/10. WBC is within normal limits. Patient is afebrile. Trough level just slightly below goal at 14 (goal 15-20). SCr 0.69 >>0.75. Patient responding to therapy and likely to accumulate further with longer therapy of 10 days.   Plan: Continue vancomycin 1g IV every 8 hours.  Stop date added for 07/03/16.  Monitor clinical response.    Height: 6\' 2"  (188 cm) Weight: 214 lb 8.1 oz (97.3 kg) IBW/kg (Calculated) : 82.2  Temp (24hrs), Avg:98.3 F (36.8 C), Min:97.7 F (36.5 C), Max:99.1 F (37.3 C)   Recent Labs Lab 06/23/16 1424 06/23/16 2300 06/24/16 0549 06/25/16 0320 06/26/16 0424 06/26/16 0800 06/27/16 0450 06/28/16 0443 06/29/16 1318  WBC  --   --  11.3* 11.2* 8.5  --  9.3 10.3  --   CREATININE  --  0.84 0.75 0.74 0.70  --  0.73 0.69 0.75  LATICACIDVEN 1.0 0.9 0.7  --   --   --   --   --   --   VANCOTROUGH  --   --   --   --   --  18  --   --  14*    Estimated Creatinine Clearance: 128.4 mL/min (by C-G formula based on SCr of 0.75 mg/dL).    Allergies  Allergen Reactions  . Oxycodone-Acetaminophen     Severe rash and itching  . Oxycodone Hcl     itching  . Percocet [Oxycodone-Acetaminophen] Other (See Comments)    itching    Antimicrobials this admission: 5/15 VT = 18 on 1g IV every 8 hours (SCr 0.70), continue 5/18 VT = 14 on 1g IV every 8 hours (SCr 0.75), continue  Dose adjustments this admission:   Microbiology results: 5/1  MRSA pcr negative 5/12 urine: negative 5/12 bld x2: negative 5/12 TA: MRSA Thank you for allowing pharmacy to be a part of this patient's care.  Link SnufferJessica Blythe Veach, PharmD, BCPS Clinical Pharmacist Clinical phone 06/29/2016 until 3:30 PM- [phone] After hours, please call [phone] 06/29/2016 2:14 PM

## 2016-06-29 NOTE — Evaluation (Signed)
Physical Therapy Evaluation Patient Details Name: [name] MRN: [id number] DOB: 1965-03-13 Today's Date: 06/29/2016   History of Present Illness  Clifford Liz Jonesis a 51 y.o.malewith medical history significant fortraumatic brain injury, chronic pain, seizure disorder, and COPD who presented to  the  [facility] emergency department after suffering multiple seizures.  In ED, pt unable to protect his airway after back to back seizures and was intubated.  Pt reintubated 5/11 for seizure and hypoxia, septic shock.  Transfered to [facility] ED, pt admitted to ICU for ongoing evaluation.    Clinical Impression  Pt admitted with/for seizure and course has been complicated.  Pt presently mod assist for instability with basic mobility and gait.  Pt likely has no place to d/c to at this time and few resources.  He will likely not have any coverage for follow up therapy, but as of now, would benefit.  Pt currently limited functionally due to the problems listed below.  (see problems list.)  Pt will benefit from PT to maximize function and safety to be able to get home safely with limited available assist.     Follow Up Recommendations Other (comment) (probably will not qualify for follow up, but would benefit.)    Equipment Recommendations  Other (comment) (TBA)    Recommendations for Other Services       Precautions / Restrictions Precautions Precautions: Fall      Mobility  Bed Mobility Overal bed mobility: Needs Assistance Bed Mobility: Supine to Sit;Sit to Supine     Supine to sit: Mod assist Sit to supine: Min guard   General bed mobility comments: getting OOB, pt appeared to be apraxic, but could be a spatial problem  Transfers Overall transfer level: Needs assistance   Transfers: Sit to/from Stand Sit to Stand: Mod assist         General transfer comment: pt having problems finding upright vertical orientation and keeping his balance.  Ambulation/Gait Ambulation/Gait  assistance: Mod assist Ambulation Distance (Feet): 15 Feet (x2 to bathroom) Assistive device: 1 person hand held assist Gait Pattern/deviations: Step-through pattern     General Gait Details: ataxic wide BOS gait with staggering due to what appears to be a spatial orientation problem.  Stairs            Wheelchair Mobility    Modified Rankin (Stroke Patients Only)       Balance Overall balance assessment: Needs assistance Sitting-balance support: Single extremity supported;No upper extremity supported Sitting balance-Leahy Scale: Fair     Standing balance support: Bilateral upper extremity supported;Single extremity supported Standing balance-Leahy Scale: Poor Standing balance comment: pt not steady in upright position and leaning against the wall during extensive pericare session.                             Pertinent Vitals/Pain Pain Assessment: Faces Faces Pain Scale: No hurt    Home Living Family/patient expects to be discharged to:: Unsure Living Arrangements: Alone Available Help at Discharge: Other (Comment) (son supposedly interested in helping pt, but not boarding) Type of Home: Homeless         Home Equipment: None      Prior Function Level of Independence: Independent         Comments: Was in his usual state of health until drinking binge PTA at [facility], before the seizing.  Generally independent.     Hand Dominance        Extremity/Trunk Assessment  Upper Extremity Assessment Upper Extremity Assessment: Overall WFL for tasks assessed    Lower Extremity Assessment Lower Extremity Assessment: Overall WFL for tasks assessed;RLE deficits/detail;LLE deficits/detail RLE Deficits / Details: difficult to test, main problem appears to be incoordination bilaterally LLE Deficits / Details: incoordination of LE's       Communication   Communication: No difficulties  Cognition     Overall Cognitive Status: Impaired/Different from  baseline                                 General Comments: Tangential and perseverative of thought      General Comments General comments (skin integrity, edema, etc.): pt is too unfocused and tangential of thought to delve into issues with any depth.  Balance in standing and funtionally is poor needing mod stability/supporting assist..  Gait and transfers are equally difficult and unsafe due to his impulsivity and poor balance.    Exercises     Assessment/Plan    PT Assessment Patient needs continued PT services  PT Problem List Decreased activity tolerance;Decreased balance;Decreased mobility;Decreased coordination;Decreased cognition       PT Treatment Interventions Gait training;DME instruction;Functional mobility training;Therapeutic activities;Balance training;Patient/family education    PT Goals (Current goals can be found in the Care Plan section)  Acute Rehab PT Goals Patient Stated Goal: I'm not going to drink ever again PT Goal Formulation: With patient Time For Goal Achievement: 07/13/16 Potential to Achieve Goals: Fair    Frequency Min 3X/week   Barriers to discharge Decreased caregiver support Pt is likely homeless and has few family resources due to "burnt bridges"    Co-evaluation               AM-PAC PT "6 Clicks" Daily Activity  Outcome Measure Difficulty turning over in bed (including adjusting bedclothes, sheets and blankets)?: Total Difficulty moving from lying on back to sitting on the side of the bed? : Total Difficulty sitting down on and standing up from a chair with arms (e.g., wheelchair, bedside commode, etc,.)?: Total Help needed moving to and from a bed to chair (including a wheelchair)?: A Lot Help needed walking in hospital room?: A Lot Help needed climbing 3-5 steps with a railing? : A Lot 6 Click Score: 9    End of Session   Activity Tolerance: Patient tolerated treatment well Patient left: in bed;with call  bell/phone within reach;with bed alarm set Nurse Communication: Mobility status PT Visit Diagnosis: Unsteadiness on feet (R26.81);Other abnormalities of gait and mobility (R26.89);Ataxic gait (R26.0)    Time: [id number] PT Time Calculation (min) (ACUTE ONLY): 32 min   Charges:   PT Evaluation $PT Eval High Complexity: 1 Procedure PT Treatments $Gait Training: 8-22 mins   PT G Codes:        06/29/2016  Hayward BingKen Nikolette Reindl, PT [phone]  (pager)  Eliseo GumKenneth V Petra Dumler 06/29/2016, 4:48 PM

## 2016-06-29 NOTE — Progress Notes (Signed)
Neurology Progress Note  **Late enty--patient was seen this morning at 0745**  Subjective: He was transferred to the floor yesterday. He appears confused this morning and is very tangential with limited sustained attention. He is frustrated because he wants to be able to get up out of bed and does not understand why he is considered a fall precaution, insisting on showing me that he is "strong as an ox." He becomes irritable at times but is redirectable. He states that he is uncomfortable in his hospital bed because he does not like having his knees bent. He complains of some posterior neck pain. He continues to have occasional cough. His mouth and lips are dry. He wants to know why he is not getting albuterol or Qvar since he usually takes these as an outpatient. Remainder of 10-point ROS is unremarkable.   He has not had any reported seizure activity.   Medications reviewed and reconciled.   Pertinent meds: Folate 1 mg daily MVI daily Thiamine 100 mg daily Depacon 500 mg q8h Paxil 20 mg daily  Current Meds:   Current Facility-Administered Medications:  .  0.9 %  sodium chloride infusion, , Intravenous, Continuous, Byrum, Rose Fillers, MD, Last Rate: 10 mL/hr at 06/29/16 1212 .  chlorhexidine gluconate (MEDLINE KIT) (PERIDEX) 0.12 % solution 15 mL, 15 mL, Mouth Rinse, BID, Sinda Du, MD, 15 mL at 06/28/16 0829 .  Chlorhexidine Gluconate Cloth 2 % PADS 6 each, 6 each, Topical, Daily, Javier Glazier, MD, 6 each at 06/29/16 1211 .  dextromethorphan (DELSYM) 30 MG/5ML liquid 30 mg, 30 mg, Oral, Q6H PRN, Wilhelmina Mcardle, MD, 30 mg at 06/28/16 9371 .  enoxaparin (LOVENOX) injection 40 mg, 40 mg, Subcutaneous, Q24H, Sood, Vineet, MD, 40 mg at 06/29/16 0836 .  feeding supplement (BOOST / RESOURCE BREEZE) liquid 1 Container, 1 Container, Oral, TID BM, Raylene Miyamoto, MD, 1 Container at 06/29/16 1000 .  [START ON 6/96/7893] folic acid (FOLVITE) tablet 1 mg, 1 mg, Oral, Daily, Nettey,  Ralph A, MD .  hydrALAZINE (APRESOLINE) injection 10 mg, 10 mg, Intravenous, Q6H PRN, Jani Gravel, MD, 10 mg at 06/29/16 0646 .  ibuprofen (ADVIL,MOTRIN) tablet 400 mg, 400 mg, Oral, Q6H PRN, Anders Simmonds, MD, 400 mg at 06/27/16 2243 .  ipratropium-albuterol (DUONEB) 0.5-2.5 (3) MG/3ML nebulizer solution 3 mL, 3 mL, Nebulization, Q6H PRN, Raylene Miyamoto, MD .  LORazepam (ATIVAN) injection 1-2 mg, 1-2 mg, Intravenous, Q15 min PRN, Javier Glazier, MD .  MEDLINE mouth rinse, 15 mL, Mouth Rinse, QID, Sinda Du, MD, 15 mL at 06/29/16 0500 .  metoprolol tartrate (LOPRESSOR) tablet 12.5 mg, 12.5 mg, Oral, BID, Raylene Miyamoto, MD, 12.5 mg at 06/29/16 8101 .  [START ON 06/30/2016] multivitamin with minerals tablet 1 tablet, 1 tablet, Oral, Daily, Nettey, Ralph A, MD .  [DISCONTINUED] ondansetron (ZOFRAN) tablet 4 mg, 4 mg, Oral, Q6H PRN **OR** ondansetron (ZOFRAN) injection 4 mg, 4 mg, Intravenous, Q6H PRN, Opyd, Ilene Qua, MD, 4 mg at 06/28/16 7510 .  PARoxetine (PAXIL) tablet 20 mg, 20 mg, Oral, Daily, Raylene Miyamoto, MD, 20 mg at 06/29/16 [phone] .  [START ON 06/30/2016] thiamine (VITAMIN B-1) tablet 100 mg, 100 mg, Oral, Daily, Nettey, Ralph A, MD .  valproate (DEPACON) 500 mg in dextrose 5 % 50 mL IVPB, 500 mg, Intravenous, Q8H, Phillips Odor, MD, Stopped at 06/29/16 0747 .  vancomycin (VANCOCIN) IVPB 1000 mg/200 mL premix, 1,000 mg, Intravenous, Q8H, Javier Glazier, MD, Stopped at 06/29/16 [phone]  Objective:  Temp:  [97.7 F (36.5 C)-99.1 F (37.3 C)] 98.6 F (37 C) (05/18 0621) Pulse Rate:  [80-94] 85 (05/18 0712) Resp:  [18] 18 (05/18 0621) BP: (137-169)/(63-102) 156/76 (05/18 0712) SpO2:  [96 %-98 %] 96 % (05/18 0621)  General: WDWN Caucasian man in NAD. He is alert. He is confused and very tangential. He is irritable and at times demanding but can be redirected. He is oriented to self and hospital. He thinks it is night rather than morning. Speech is clear without  dysarthria.  HEENT: Neck is supple without lymphadenopathy. He has some tenderness in the cervical paraspinals and trapezius bilaterally. Mucous membranes are dry and the oropharynx is clear. Sclerae are anicteric. There is no conjunctival injection.  CV: Regular, no murmur. Carotid pulses are 2+ and symmetric with no bruits. Distal pulses 2+ and symmetric.  Lungs: CTAB  Extremities: No C/C/E. Neuro: MS: As noted above.  CN: Pupils are equal and reactive from 3-->2 mm bilaterally. EOMI, no nystagmus. There is breakup of smooth pursuits in all directions. He has some hypometric saccades. Facial sensation is intact to light touch. He has mild R facial weakness with occasional fasciculations. 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 Voice is normal in tone and quality. Palate elevates symmetrically. Uvula is midline. Bilateral SCM and trapezii are 5/5. Tongue is midline with normal bulk and mobility.  Motor: Normal bulk, tone, and strength throughout. No pronator drift. No tremor or other abnormal movements are observed.  Sensation: Intact to light touch.  DTRs: 3+, symmetric. Toes are downgoing bilaterally. No pathological reflexes.  Coordination: Finger-to-nose is slow but without dysmetria bilaterally.    Labs: Lab Results  Component Value Date   WBC 10.3 06/28/2016   HGB 10.8 (L) 06/28/2016   HCT 32.7 (L) 06/28/2016   PLT 323 06/28/2016   GLUCOSE 115 (H) 06/28/2016   TRIG 101 06/25/2016   ALT 15 (L) 06/23/2016   AST 25 06/23/2016   NA 141 06/28/2016   K 3.3 (L) 06/28/2016   CL 105 06/28/2016   CREATININE 0.69 06/28/2016   BUN 12 06/28/2016   CO2 27 06/28/2016   TSH 1.197 06/12/2016   INR 0.95 06/12/2016   HGBA1C 5.3 06/12/2016   CBC Latest Ref Rng & Units 06/28/2016 06/27/2016 06/26/2016  WBC 4.0 - 10.5 K/uL 10.3 9.3 8.5  Hemoglobin 13.0 - 17.0 g/dL 10.8(L) 10.8(L) 10.9(L)  Hematocrit 39.0 - 52.0 % 32.7(L) 32.2(L)  32.8(L)  Platelets 150 - 400 K/uL 323 319 296    Lab Results  Component Value Date   HGBA1C 5.3 06/12/2016   Lab Results  Component Value Date   ALT 15 (L) 06/23/2016   AST 25 06/23/2016   ALKPHOS 48 06/23/2016   BILITOT 1.1 06/23/2016    Radiology:  There is no new neuroimaging for review.   A/P:   1. 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 Vimpat was stopped yesterday. Continue Depakote 500 mg TID. He was having difficulty affording the Topamax that was previous Prescribed and was therefore not taking any antiepileptics prior to his admission. Hopefully he'll be able to afford Depakote which is one of the cheaper agents at this time. Continue seizure precautions.  2. Acute encephalopathy: He is more confused again this morning, consistent with delirium. I'm unclear exactly what his functional and cognitive baselines are given reported history of traumatic brain injury with aggression and violent behavior. Continue  supportive care, optimizing metabolic status and treating any underlying infection as needed. Minimize CNS active medications as much as possible. Continue to optimize sleep-wake cycles.  3. 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 We'll need to try to determine what his precise neuropsychiatric and cognitive baselines are. He has some obvious difficulties with sustained attention, word finding, and speed of processing. This could be related to his traumatic brain injury but he also has delirium which could explain these findings as well. Continue supportive care as above.  No family was present at the time of my visit.  Melba Coon, MD [facility]

## 2016-06-29 NOTE — NC FL2 (Signed)
[place] LEVEL OF CARE SCREENING TOOL     IDENTIFICATION  Patient Name: [name] Birthdate: 10-11-1965 Sex: male Admission Date (Current Location): 06/12/2016  [facility] and [place] Number:  [employer] and Address:  [facility] [place], [place] [address], [place], [place] [address]      Provider Number: [id number]  Attending Physician Name and Address:  Mariel Aloe, MD  Relative Name and Phone Number:       Current Level of Care: Hospital Recommended Level of Care: [place] Prior Approval Number:    Date Approved/Denied:   PASRR Number: [id number]  Discharge Plan: SNF    Current Diagnoses: Patient Active Problem List   Diagnosis Date Noted  . Protein-calorie malnutrition, severe 06/20/2016  . Aspiration pneumonia ([place]) 06/15/2016  . Metabolic encephalopathy 93/81/0175  . Alcohol abuse with intoxication ([place]) 06/12/2016  . COPD (chronic obstructive pulmonary disease) ([place]) 06/12/2016  . History of traumatic brain injury 06/12/2016  . Seizures ([place]) 06/12/2016  . Endotracheally intubated   . Respiratory failure ([facility])     Orientation RESPIRATION BLADDER Height & Weight     Self, Time, Situation, Place  Normal External catheter Weight: 214 lb 8.1 oz (97.3 kg) Height:  6' 2"  (188 cm)  BEHAVIORAL SYMPTOMS/MOOD NEUROLOGICAL BOWEL NUTRITION STATUS    Convulsions/Seizures Incontinent    AMBULATORY STATUS COMMUNICATION OF NEEDS Skin    (unassessed at this time) Verbally Normal                       Personal Care Assistance Level of Assistance  Bathing, Dressing Bathing Assistance: Limited assistance   Dressing Assistance: Limited assistance     Functional Limitations Info             SPECIAL CARE FACTORS FREQUENCY  PT (By licensed PT), OT (By licensed OT)     PT Frequency: will assess OT Frequency: will assess            Contractures      Additional Factors Info  Code Status,  Allergies, Psychotropic Code Status Info: full Allergies Info: Oxycodone-acetaminophen, Oxycodone Hcl, Percocet Oxycodone-acetaminophen Psychotropic Info: Depakote 557m, Paxil 210m        Current Medications (06/29/2016):  This is the current hospital active medication list Current Facility-Administered Medications  Medication Dose Route Frequency Provider Last Rate Last Dose  . 0.9 %  sodium chloride infusion   Intravenous Continuous ByCollene GobbleMD 10 mL/hr at 06/29/16 1212    . chlorhexidine gluconate (MEDLINE KIT) (PERIDEX) 0.12 % solution 15 mL  15 mL Mouth Rinse BID HaSinda DuMD   15 mL at 06/29/16 0800  . Chlorhexidine Gluconate Cloth 2 % PADS 6 each  6 each Topical Daily NeJavier GlazierMD   6 each at 06/29/16 1211  . dextromethorphan (DELSYM) 30 MG/5ML liquid 30 mg  30 mg Oral Q6H PRN SiWilhelmina McardleMD   30 mg at 06/28/16 081025. divalproex (DEPAKOTE) DR tablet 500 mg  500 mg Oral Q8H OsDarrel ReachMD   500 mg at 06/29/16 1501  . enoxaparin (LOVENOX) injection 40 mg  40 mg Subcutaneous Q24H SoChesley MiresMD   40 mg at 06/29/16 0836  . feeding supplement (BOOST / RESOURCE BREEZE) liquid 1 Container  1 Container Oral TID BM FeRaylene MiyamotoMD   1 Container at 06/29/16 1500  . [START ON 06/20/50/7782]olic acid (FOLVITE) tablet 1 mg  1 mg Oral Daily Mariel Aloe, MD      . hydrALAZINE (APRESOLINE) injection 10 mg  10 mg Intravenous Q6H PRN Jani Gravel, MD   10 mg at 06/29/16 0646  . ibuprofen (ADVIL,MOTRIN) tablet 400 mg  400 mg Oral Q6H PRN Anders Simmonds, MD   400 mg at 06/27/16 2243  . ipratropium-albuterol (DUONEB) 0.5-2.5 (3) MG/3ML nebulizer solution 3 mL  3 mL Nebulization Q6H PRN Raylene Miyamoto, MD      . LORazepam (ATIVAN) injection 1-2 mg  1-2 mg Intravenous Q15 min PRN Javier Glazier, MD      . metoprolol tartrate (LOPRESSOR) tablet 12.5 mg  12.5 mg Oral BID Raylene Miyamoto, MD   12.5 mg at 06/29/16 4720  . [START ON 06/30/2016]  multivitamin with minerals tablet 1 tablet  1 tablet Oral Daily Mariel Aloe, MD      . ondansetron [facility]) injection 4 mg  4 mg Intravenous Q6H PRN Opyd, Ilene Qua, MD   4 mg at 06/28/16 7218  . PARoxetine (PAXIL) tablet 20 mg  20 mg Oral Daily Raylene Miyamoto, MD   20 mg at 06/29/16 2883  . [START ON 06/30/2016] thiamine (VITAMIN B-1) tablet 100 mg  100 mg Oral Daily Mariel Aloe, MD      . vancomycin (VANCOCIN) IVPB 1000 mg/200 mL premix  1,000 mg Intravenous Q8H Javier Glazier, MD   Stopped at 06/29/16 [phone]     Discharge Medications: Please see discharge summary for a list of discharge medications.  Relevant Imaging Results:  Relevant Lab Results:   Additional Information SS#: [id number]  Geralynn Ochs, LCSW

## 2016-06-29 NOTE — Progress Notes (Signed)
CSW attempted to follow up with patient to provide homelessness resources this morning. Pt remains confused and unable to understand information or read resources. Pt attempted to read information and read signs in the room, but was reading information incorrectly. CSW left resources in room for family to review, and for pt to have available when his cognition improves to previous levels. Pt gave permission for CSW to contact his daughters.   CSW engaged daughter, Rosezetta SchlatterKay-Leigh, on the phone. Daughter agreed to talk to provide background information to CSW on pt's current living situation, but requested being removed from the pt's file. Daughter discussed that she had come to visit the pt last night, with her sisters, and the pt made some insulting comments to her, so she no longer wants to be involved with him. Daughter provided information that she had been informed by the pt's step-son, Apolinar JunesBrandon, that the pt was evicted from his previous home by his landlord, but she did not have any additional information. Daughter acknowledged that the pt's sources of social support were extremely limited, because he had "done so many people wrong" and not many people were willing to help him. Daughter discussed that the pt also had no current sources of income that she was aware of. Daughter indicated that step-son, Apolinar JunesBrandon, should be first point of contact, and also the pt's father.   CSW will continue to follow to facilitate discharge planning as needed.  Blenda Nicelylizabeth Kenlyn Lose LCSW [phone]

## 2016-06-30 DIAGNOSIS — G9341 Metabolic encephalopathy: Secondary | ICD-10-CM

## 2016-06-30 MED ORDER — NICOTINE 14 MG/24HR TD PT24: 14.0000 mg | MEDICATED_PATCH | Freq: Every day | TRANSDERMAL | Status: DC

## 2016-06-30 MED ORDER — DOXYCYCLINE HYCLATE 100 MG PO TABS
100.0000 mg | ORAL_TABLET | Freq: Two times a day (BID) | ORAL | Status: DC
  Administered 2016-06-30 – 2016-07-01 (×3): 100 mg via ORAL
  Filled 2016-06-30 (×3): qty 1

## 2016-06-30 MED ORDER — POTASSIUM CHLORIDE CRYS ER 20 MEQ PO TBCR
40.0000 meq | EXTENDED_RELEASE_TABLET | Freq: Once | ORAL | Status: AC
  Administered 2016-06-30: 40 meq via ORAL
  Filled 2016-06-30: qty 2

## 2016-06-30 MED ORDER — ADULT MULTIVITAMIN W/MINERALS CH: 1.0000 | ORAL_TABLET | ORAL | Status: DC

## 2016-06-30 MED ORDER — ALBUTEROL SULFATE (2.5 MG/3ML) 0.083% IN NEBU: 3.0000 mL | INHALATION_SOLUTION | Freq: Four times a day (QID) | RESPIRATORY_TRACT | Status: DC | PRN

## 2016-06-30 NOTE — Progress Notes (Signed)
Neurology Progress Note  Subjective: He feels better this morning. He states that he slept well last night and has been lazy today. He has no complaints on 10-point ROS today. He tells me that he has worked things out with an ex-wife and she has agreed to let him come live with her after he gets out of the hospital. He tells me that she lives on [place] acres and he will be doing work around her home and property in return for being able to stay there. He states that this includes building a log cabin on the property. He has not had any further seizure activity. He is not nearly as confused as he was yesterday.   He has not had any reported seizure activity.   Medications reviewed and reconciled.   Pertinent meds: Folate 1 mg daily MVI daily Thiamine 100 mg daily Depacon 500 mg q8h Paxil 20 mg daily  Current Meds:   Current Facility-Administered Medications:  .  0.9 %  sodium chloride infusion, , Intravenous, Continuous, Byrum, Rose Fillers, MD, Last Rate: 10 mL/hr at 06/29/16 1212 .  albuterol (PROVENTIL) (2.5 MG/3ML) 0.083% nebulizer solution 3 mL, 3 mL, Inhalation, Q6H PRN, Darrel Reach, MD .  chlorhexidine gluconate (MEDLINE KIT) (PERIDEX) 0.12 % solution 15 mL, 15 mL, Mouth Rinse, BID, Sinda Du, MD, 15 mL at 06/30/16 0912 .  Chlorhexidine Gluconate Cloth 2 % PADS 6 each, 6 each, Topical, Daily, Javier Glazier, MD, 6 each at 06/30/16 0912 .  dextromethorphan (DELSYM) 30 MG/5ML liquid 30 mg, 30 mg, Oral, Q6H PRN, Wilhelmina Mcardle, MD, 30 mg at 06/28/16 1610 .  divalproex (DEPAKOTE) DR tablet 500 mg, 500 mg, Oral, Q8H, Darrel Reach, MD, 500 mg at 06/30/16 9604 .  enoxaparin (LOVENOX) injection 40 mg, 40 mg, Subcutaneous, Q24H, Sood, Vineet, MD, 40 mg at 06/30/16 0912 .  feeding supplement (BOOST / RESOURCE BREEZE) liquid 1 Container, 1 Container, Oral, TID BM, Raylene Miyamoto, MD, 1 Container at 06/30/16 [phone]  folic acid (FOLVITE) tablet 1 mg, 1 mg, Oral, Daily,  Mariel Aloe, MD, 1 mg at 06/30/16 0912 .  hydrALAZINE (APRESOLINE) injection 10 mg, 10 mg, Intravenous, Q6H PRN, Jani Gravel, MD, 10 mg at 06/29/16 0646 .  ibuprofen (ADVIL,MOTRIN) tablet 400 mg, 400 mg, Oral, Q6H PRN, Anders Simmonds, MD, 400 mg at 06/27/16 2243 .  ipratropium-albuterol (DUONEB) 0.5-2.5 (3) MG/3ML nebulizer solution 3 mL, 3 mL, Nebulization, Q6H PRN, Raylene Miyamoto, MD .  LORazepam (ATIVAN) injection 1-2 mg, 1-2 mg, Intravenous, Q15 min PRN, Javier Glazier, MD .  metoprolol tartrate (LOPRESSOR) tablet 12.5 mg, 12.5 mg, Oral, BID, Raylene Miyamoto, MD, 12.5 mg at 06/30/16 8119 .  multivitamin with minerals tablet 1 tablet, 1 tablet, Oral, Daily, Mariel Aloe, MD, 1 tablet at 06/30/16 0912 .  [DISCONTINUED] ondansetron (ZOFRAN) tablet 4 mg, 4 mg, Oral, Q6H PRN **OR** ondansetron (ZOFRAN) injection 4 mg, 4 mg, Intravenous, Q6H PRN, Opyd, Ilene Qua, MD, 4 mg at 06/28/16 1478 .  PARoxetine (PAXIL) tablet 20 mg, 20 mg, Oral, Daily, Raylene Miyamoto, MD, 20 mg at 06/30/16 0912 .  thiamine (VITAMIN B-1) tablet 100 mg, 100 mg, Oral, Daily, Mariel Aloe, MD, 100 mg at 06/30/16 0912 .  vancomycin (VANCOCIN) IVPB 1000 mg/200 mL premix, 1,000 mg, Intravenous, Q8H, Javier Glazier, MD, Stopped at 06/30/16 0748  Objective:  Temp:  [98.1 F (36.7 C)-99 F (37.2 C)] 98.5 F (36.9 C) (05/19 0523) Pulse  Rate:  [92-98] 98 (05/19 0523) Resp:  [18] 18 (05/19 0523) BP: (140-164)/(75-89) 155/75 (05/19 0523) SpO2:  [98 %] 98 % (05/19 0523)  General: WDWN Caucasian man in NAD. He is alert, oriented to self, [facility], month and year.  Speech is clear without dysarthria. Affect is bright and comportment is normal.  HEENT: Neck is supple without lymphadenopathy. He denies any muscle tenderness today. Mucous membranes are dry and the oropharynx is clear. Sclerae are anicteric. There is no conjunctival injection.  CV: Regular, no murmur. Carotid pulses are 2+ and symmetric  with no bruits. Distal pulses 2+ and symmetric.  Lungs: CTAB  Extremities: No C/C/E. Neuro: MS: As noted above.  CN: Pupils are equal and reactive from 3-->2 mm bilaterally. EOMI, no nystagmus. There is breakup of smooth pursuits in all directions. Facial sensation is intact to light touch. He has mild R facial weakness with occasional tics. 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 Voice is normal in tone and quality. Palate elevates symmetrically. Uvula is midline. Bilateral SCM and trapezii are 5/5. Tongue is midline with normal bulk and mobility.  Motor: Normal bulk, tone, and strength throughout. No pronator drift. No tremor or other abnormal movements are observed.  Sensation: Intact to light touch.  DTRs: 3+, symmetric. Toes are downgoing bilaterally. No pathological reflexes.  Coordination: Finger-to-nose is slow but without dysmetria bilaterally.    Labs: Lab Results  Component Value Date   WBC 10.3 06/28/2016   HGB 10.8 (L) 06/28/2016   HCT 32.7 (L) 06/28/2016   PLT 323 06/28/2016   GLUCOSE 151 (H) 06/29/2016   TRIG 101 06/25/2016   ALT 15 (L) 06/23/2016   AST 25 06/23/2016   NA 141 06/29/2016   K 3.2 (L) 06/29/2016   CL 106 06/29/2016   CREATININE 0.75 06/29/2016   BUN 9 06/29/2016   CO2 26 06/29/2016   TSH 1.197 06/12/2016   INR 0.95 06/12/2016   HGBA1C 5.3 06/12/2016   CBC Latest Ref Rng & Units 06/28/2016 06/27/2016 06/26/2016  WBC 4.0 - 10.5 K/uL 10.3 9.3 8.5  Hemoglobin 13.0 - 17.0 g/dL 10.8(L) 10.8(L) 10.9(L)  Hematocrit 39.0 - 52.0 % 32.7(L) 32.2(L) 32.8(L)  Platelets 150 - 400 K/uL 323 319 296    Lab Results  Component Value Date   HGBA1C 5.3 06/12/2016   Lab Results  Component Value Date   ALT 15 (L) 06/23/2016   AST 25 06/23/2016   ALKPHOS 48 06/23/2016   BILITOT 1.1 06/23/2016    Radiology:  There is no new neuroimaging for review.   A/P:   1. Seizures: He has a reported history of  post-traumatic seizures. He has been seizure free since 5/11. Continue Depakote 500 mg TID. Continue seizure precautions.  2. Acute encephalopathy: His mental status is better today. Will continue to observe as he has had some fluctuations. I'm unclear exactly what his functional and cognitive baselines are given reported history of traumatic brain injury with aggression and violent behavior. Continue supportive care, optimizing metabolic status and treating any underlying infection as needed. Minimize CNS active medications as much as possible. Continue to optimize sleep-wake cycles.  3. 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 It is not clear what his precise neuropsychiatric and cognitive baselines are. He has some obvious difficulties with sustained attention, word finding, and speed of processing. This could be related to his traumatic brain injury but he also has delirium which  could explain these findings as well. Continue supportive care as above.  No family was present at the time of my visit.  Melba Coon, MD [facility]

## 2016-06-30 NOTE — Progress Notes (Signed)
PROGRESS NOTE    [name]  BTY:[id number] DOB: March 21, 1965 DOA: 06/12/2016 PCP: Sinda Du, MD   Brief Narrative: [name] is a 51 y.o. male with a history of alcohol abuse, COPD, TBI. He presented with seizures. He required mechanical ventilation and was extubated on 5/16. Seizures managed with AEDs.   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 Currently on valproate. No seizures. Vimpat discontinued. -Ativan prn for seizures -neurology recommendations: valproate, continued observation  MRSA pneumonia Stable. Afebrile -discontinue Vancomycin -start doxycycline to complete 10 day course of antibiotics  Alcohol abuse Passed window for alcohol withdrawal. -continue thiamine -continue multivitamin  History of traumatic brain injury Altered mental status Unsure of patient's baseline functioning. Patient will get in touch with daughters. Will discuss patient's baseline functioning with family.  Protein-calorie malnutrition -continue supplement  COPD Not in exacerbation -continue Delsym prn -continue Duoneb prn  DVT prophylaxis: Lovenox Code Status: Full code Family Communication: None at bedside Disposition Plan: Discharge likely in 24-48 hours if continues to have stable mental status   Consultants:   Neurology  Procedures:   ETT (5/11 - 5/16)  Antimicrobials:  Zosyn (5/2>>5/14)  Vancomycin (5/11>>5/19)  Doxycycline (5/19>>5/21   Subjective: No chest pain, dyspnea, palpitations. Patient reports some neck pain for which she sees therapy as an outpatient.  Objective: Vitals:   06/28/16 0400  06/30/16 0039 06/30/16 0523 06/30/16 1048  BP:  (!) 144/75 (!) 155/75 118/73  Pulse:  96 98 87  Resp:  18 18 20   Temp:  98.3 F (36.8 C) 98.5 F (36.9 C) 98.6 F (37 C)  TempSrc:  Oral Oral Oral  SpO2:  98% 98% 95%  Weight: 97.3 kg (214 lb 8.1 oz)     Height:       No intake or output data in the 24 hours ending 06/30/16 1119 Filed Weights   06/26/16 0500 06/27/16 0458 06/28/16 0400  Weight: 94.3 kg (207 lb 14.3 oz) 95.9 kg (211 lb 6.7 oz) 97.3 kg (214 lb 8.1 oz)    Examination:  General exam: Appears calm and comfortable Respiratory system: Clear to auscultation. Respiratory effort normal. Cardiovascular system: S1 & S2 heard, RRR. No murmurs, rubs, gallops or clicks. Gastrointestinal system: Abdomen is nondistended, soft and nontender. Normal bowel sounds heard. Central nervous system: Alert and oriented to person, place and year. No focal neurological deficits. Extremities: No edema. No calf tenderness Skin: No cyanosis. Psychiatry: Judgement and insight appear normal. Mood & affect flat and depressed. Psychomotor retardation.     Data Reviewed: I have personally reviewed following labs and imaging studies  CBC:  Recent Labs Lab 06/24/16 0549 06/25/16 0320 06/26/16 0424 06/27/16 0450 06/28/16 0443  WBC 11.3* 11.2* 8.5 9.3 10.3  NEUTROABS 8.6* 8.0*  --   --   --   HGB 10.7* 11.9* 10.9* 10.8* 10.8*  HCT 32.1* 35.4* 32.8* 32.2* 32.7*  MCV 94.4 94.1 95.9 97.3 94.5  PLT 181 280 296 319 774   Basic Metabolic Panel:  Recent Labs Lab 06/24/16 0549 06/25/16 0320 06/26/16 0424 06/27/16 0450 06/28/16 0443 06/29/16 1318  NA 141 145 146* 139 141 141  K 3.1* 3.0* 3.1* 3.1*  3.3* 3.2*  CL 108 107 108 105 105 106  CO2 26 30 31 27 27 26   GLUCOSE 119* 126* 111* 177* 115* 151*  BUN 10 8 14 17 12 9   CREATININE 0.75 0.74 0.70 0.73 0.69 0.75  CALCIUM 8.1* 8.5* 8.5* 7.8* 8.4* 8.5*  MG 1.9 2.1 2.2  --  2.1 2.1  PHOS 2.0* 2.9  --   --  3.0 3.3   GFR: Estimated  Creatinine Clearance: 128.4 mL/min (by C-G formula based on SCr of 0.75 mg/dL). Liver Function Tests:  Recent Labs Lab 06/24/16 0549 06/25/16 0320  ALBUMIN 2.2* 2.4*   No results for input(s): LIPASE, AMYLASE in the last 168 hours. No results for input(s): AMMONIA in the last 168 hours. Coagulation Profile: No results for input(s): INR, PROTIME in the last 168 hours. Cardiac Enzymes: No results for input(s): CKTOTAL, CKMB, CKMBINDEX, TROPONINI in the last 168 hours. BNP (last 3 results) No results for input(s): PROBNP in the last 8760 hours. HbA1C: No results for input(s): HGBA1C in the last 72 hours. CBG:  Recent Labs Lab 06/27/16 1931 06/27/16 2320 06/28/16 0411 06/28/16 0725 06/28/16 1138  GLUCAP 119* 107* 89 89 109*   Lipid Profile: No results for input(s): CHOL, HDL, LDLCALC, TRIG, CHOLHDL, LDLDIRECT in the last 72 hours. Thyroid Function Tests: No results for input(s): TSH, T4TOTAL, FREET4, T3FREE, THYROIDAB in the last 72 hours. Anemia Panel: No results for input(s): VITAMINB12, FOLATE, FERRITIN, TIBC, IRON, RETICCTPCT in the last 72 hours. Sepsis Labs:  Recent Labs Lab 06/23/16 1218 06/23/16 1424 06/23/16 2300 06/24/16 0549 06/25/16 0320  PROCALCITON 0.11  --   --  <0.10 <0.10  LATICACIDVEN  --  1.0 0.9 0.7  --     Recent Results (from the past 240 hour(s))  Culture, blood (Routine X 2) w Reflex to ID Panel     Status: None   Collection Time: 06/23/16  2:11 AM  Result Value Ref Range Status   Specimen Description A-LINE  Final   Special Requests   Final    BOTTLES DRAWN AEROBIC AND ANAEROBIC Blood Culture results may not be optimal due to an inadequate volume of blood received in culture bottles DRAWN BY RN   Culture NO GROWTH 5 DAYS  Final   Report Status 06/28/2016 FINAL  Final  Culture, blood (Routine X 2) w Reflex to ID Panel     Status: None   Collection Time: 06/23/16  2:11 AM  Result Value Ref Range Status   Specimen Description LEFT  ANTECUBITAL  Final   Special Requests   Final    BOTTLES DRAWN AEROBIC AND ANAEROBIC Blood Culture results may not be optimal due to an inadequate volume of blood received in culture bottles   Culture NO GROWTH 5 DAYS  Final   Report Status 06/28/2016 FINAL  Final  Urine culture     Status: None   Collection Time: 06/23/16  1:09 PM  Result Value Ref Range Status   Specimen Description URINE, RANDOM  Final   Special Requests NONE  Final   Culture NO GROWTH  Final   Report Status 06/24/2016 FINAL  Final  Culture, respiratory (NON-Expectorated)     Status: None   Collection Time: 06/23/16  4:28 PM  Result Value Ref Range Status   Specimen Description TRACHEAL ASPIRATE  Final   Special Requests Normal  Final   Gram Stain   Final    MODERATE WBC PRESENT, PREDOMINANTLY PMN RARE RARE GRAM POSITIVE COCCI IN PAIRS IN  CLUSTERS    Culture FEW METHICILLIN RESISTANT STAPHYLOCOCCUS AUREUS  Final   Report Status 06/26/2016 FINAL  Final   Organism ID, Bacteria METHICILLIN RESISTANT STAPHYLOCOCCUS AUREUS  Final      Susceptibility   Methicillin resistant staphylococcus aureus - MIC*    CIPROFLOXACIN 1 SENSITIVE Sensitive     ERYTHROMYCIN >=8 RESISTANT Resistant     GENTAMICIN <=0.5 SENSITIVE Sensitive     OXACILLIN >=4 RESISTANT Resistant     TETRACYCLINE <=1 SENSITIVE Sensitive     VANCOMYCIN 1 SENSITIVE Sensitive     TRIMETH/SULFA <=10 SENSITIVE Sensitive     CLINDAMYCIN <=0.25 SENSITIVE Sensitive     RIFAMPIN <=0.5 SENSITIVE Sensitive     Inducible Clindamycin NEGATIVE Sensitive     * FEW METHICILLIN RESISTANT STAPHYLOCOCCUS AUREUS         Radiology Studies: No results found.      Scheduled Meds: . chlorhexidine gluconate (MEDLINE KIT)  15 mL Mouth Rinse BID  . Chlorhexidine Gluconate Cloth  6 each Topical Daily  . divalproex  500 mg Oral Q8H  . enoxaparin (LOVENOX) injection  40 mg Subcutaneous Q24H  . feeding supplement  1 Container Oral TID BM  . folic acid  1 mg Oral  Daily  . metoprolol tartrate  12.5 mg Oral BID  . multivitamin with minerals  1 tablet Oral Daily  . nicotine  14 mg Transdermal Daily  . PARoxetine  20 mg Oral Daily  . thiamine  100 mg Oral Daily   Continuous Infusions: . sodium chloride 10 mL/hr at 06/29/16 1212  . vancomycin Stopped (06/30/16 0748)     LOS: 18 days     Cordelia Poche, MD [facility] 06/30/2016, 11:19 AM Pager: [phone]  If 7PM-7AM, please contact night-coverage [email or web address] Password [email or web address] 06/30/2016, 11:19 AM

## 2016-07-01 DIAGNOSIS — J15212 Pneumonia due to Methicillin resistant Staphylococcus aureus: Secondary | ICD-10-CM

## 2016-07-01 LAB — BASIC METABOLIC PANEL
Anion gap: 10 (ref 5–15)
BUN: 5 mg/dL — AB (ref 6–20)
CHLORIDE: 103 mmol/L (ref 101–111)
CO2: 26 mmol/L (ref 22–32)
Calcium: 8.4 mg/dL — ABNORMAL LOW (ref 8.9–10.3)
Creatinine, Ser: 0.65 mg/dL (ref 0.61–1.24)
GFR calc Af Amer: >60 mL/min (ref >60)
GFR calc non Af Amer: >60 mL/min (ref >60)
GLUCOSE: 122 mg/dL — AB (ref 65–99)
POTASSIUM: 3 mmol/L — AB (ref 3.5–5.1)
Sodium: 139 mmol/L (ref 135–145)

## 2016-07-01 LAB — GLUCOSE, CAPILLARY: Glucose-Capillary: 94 mg/dL (ref 65–99)

## 2016-07-01 MED ORDER — THIAMINE HCL 100 MG PO TABS: 100.0000 mg | ORAL_TABLET | Freq: Every day | ORAL | 0 refills | Status: DC

## 2016-07-01 MED ORDER — POTASSIUM CHLORIDE CRYS ER 20 MEQ PO TBCR
40.0000 meq | EXTENDED_RELEASE_TABLET | Freq: Once | ORAL | Status: AC
  Administered 2016-07-01: 40 meq via ORAL
  Filled 2016-07-01: qty 2

## 2016-07-01 MED ORDER — PAROXETINE HCL 20 MG PO TABS: 20.0000 mg | ORAL_TABLET | Freq: Every day | ORAL | 0 refills | Status: DC

## 2016-07-01 MED ORDER — ADULT MULTIVITAMIN W/MINERALS CH: 1.0000 | ORAL_TABLET | Freq: Every day | ORAL | 0 refills | Status: DC

## 2016-07-01 MED ORDER — DOXYCYCLINE HYCLATE 100 MG PO TABS: 100.0000 mg | ORAL_TABLET | Freq: Two times a day (BID) | ORAL | 0 refills | Status: AC

## 2016-07-01 MED ORDER — BOOST / RESOURCE BREEZE PO LIQD: 1.0000 | Freq: Three times a day (TID) | ORAL | 0 refills | Status: DC

## 2016-07-01 MED ORDER — DIVALPROEX SODIUM 500 MG PO DR TAB: 500.0000 mg | DELAYED_RELEASE_TABLET | Freq: Three times a day (TID) | ORAL | 0 refills | Status: DC

## 2016-07-01 MED ORDER — METOPROLOL TARTRATE 25 MG PO TABS: 12.5000 mg | ORAL_TABLET | Freq: Two times a day (BID) | ORAL | 0 refills | Status: DC

## 2016-07-01 MED ORDER — FOLIC ACID 1 MG PO TABS: 1.0000 mg | ORAL_TABLET | Freq: Every day | ORAL | 0 refills | Status: DC

## 2016-07-01 NOTE — Progress Notes (Signed)
RN discussed discharge information with patient. Patient verbalized understanding of new medications, states he understands when his next dosages of medications are due. States he knows depakote is due today at 2pm. Given prescription medications, states he will drop them off at pharmacy to get filled. He states he does not want RN to call family to review discharge instructions.Patient waiting for transportation

## 2016-07-01 NOTE — Progress Notes (Signed)
Patient's father called, states that the patient has no where to go after discharge, no place to stay. MD notified, Dr. Nettey spoke with patienCaleb Poppt, patient refusing SNF, patient states he is going to stay with Virgel GessJessie Roberts. MD unable to confirm patient has place to go. Per MD, may continue with discharge. RN waited for patient's ride. Brayton CavesJessie states patient will not be staying with him but he will help patient get patient's prescriptions filled.

## 2016-07-01 NOTE — Progress Notes (Addendum)
Nolon BussingSteven Goda (DOB 12-10-1965) [facility] 07/01/16.   Haig ProphetKelly Sekai Nayak, RN 34M/W [facility]

## 2016-07-01 NOTE — Progress Notes (Signed)
Patient refuses bed alarm. Educated multiple times, patient still refuses alarm

## 2016-07-01 NOTE — Progress Notes (Signed)
Neurology Progress Note  Subjective: The patient is up and walking around his room when I came to visit today. He tells me that he is leaving the hospital this morning no matter what. He says he has a court date tomorrow and if he misses that he will have to go back to jail. He is very frustrated by the fact that he is unable to walk freely around the hospital and says that he feels like he is already in jail. He has not had any seizures.   He has no complaints on 10 point review of systems.   Medications reviewed and reconciled.   Pertinent meds: Folate 1 mg daily MVI daily Thiamine 100 mg daily Depacon 500 mg q8h Paxil 20 mg daily  Current Meds:   Current Facility-Administered Medications:  .  0.9 %  sodium chloride infusion, , Intravenous, Continuous, Byrum, Rose Fillers, MD, Last Rate: 10 mL/hr at 06/29/16 1212 .  albuterol (PROVENTIL) (2.5 MG/3ML) 0.083% nebulizer solution 3 mL, 3 mL, Inhalation, Q6H PRN, Darrel Reach, MD .  chlorhexidine gluconate (MEDLINE KIT) (PERIDEX) 0.12 % solution 15 mL, 15 mL, Mouth Rinse, BID, Sinda Du, MD, 15 mL at 06/30/16 2132 .  Chlorhexidine Gluconate Cloth 2 % PADS 6 each, 6 each, Topical, Daily, Javier Glazier, MD, 6 each at 06/30/16 0912 .  dextromethorphan (DELSYM) 30 MG/5ML liquid 30 mg, 30 mg, Oral, Q6H PRN, Wilhelmina Mcardle, MD, 30 mg at 06/28/16 9563 .  divalproex (DEPAKOTE) DR tablet 500 mg, 500 mg, Oral, Q8H, Darrel Reach, MD, 500 mg at 07/01/16 0536 .  doxycycline (VIBRA-TABS) tablet 100 mg, 100 mg, Oral, Q12H, Mariel Aloe, MD, 100 mg at 06/30/16 2131 .  enoxaparin (LOVENOX) injection 40 mg, 40 mg, Subcutaneous, Q24H, Sood, Vineet, MD, 40 mg at 06/30/16 0912 .  feeding supplement (BOOST / RESOURCE BREEZE) liquid 1 Container, 1 Container, Oral, TID BM, Raylene Miyamoto, MD, 1 Container at 06/30/16 2100 .  folic acid (FOLVITE) tablet 1 mg, 1 mg, Oral, Daily, Mariel Aloe, MD, 1 mg at 06/30/16 0912 .  hydrALAZINE  (APRESOLINE) injection 10 mg, 10 mg, Intravenous, Q6H PRN, Jani Gravel, MD, 10 mg at 06/29/16 0646 .  ibuprofen (ADVIL,MOTRIN) tablet 400 mg, 400 mg, Oral, Q6H PRN, Anders Simmonds, MD, 400 mg at 06/27/16 2243 .  ipratropium-albuterol (DUONEB) 0.5-2.5 (3) MG/3ML nebulizer solution 3 mL, 3 mL, Nebulization, Q6H PRN, Raylene Miyamoto, MD .  LORazepam (ATIVAN) injection 1-2 mg, 1-2 mg, Intravenous, Q15 min PRN, Javier Glazier, MD .  metoprolol tartrate (LOPRESSOR) tablet 12.5 mg, 12.5 mg, Oral, BID, Raylene Miyamoto, MD, 12.5 mg at 06/30/16 2131 .  multivitamin with minerals tablet 1 tablet, 1 tablet, Oral, Q24H, Nettey, Ralph A, MD .  [DISCONTINUED] ondansetron (ZOFRAN) tablet 4 mg, 4 mg, Oral, Q6H PRN **OR** ondansetron (ZOFRAN) injection 4 mg, 4 mg, Intravenous, Q6H PRN, Opyd, Ilene Qua, MD, 4 mg at 06/28/16 8756 .  PARoxetine (PAXIL) tablet 20 mg, 20 mg, Oral, Daily, Raylene Miyamoto, MD, 20 mg at 06/30/16 0912 .  thiamine (VITAMIN B-1) tablet 100 mg, 100 mg, Oral, Daily, Mariel Aloe, MD, 100 mg at 06/30/16 0912  Objective:  Temp:  [98.2 F (36.8 C)-98.8 F (37.1 C)] 98.8 F (37.1 C) (05/20 0556) Pulse Rate:  [69-87] 76 (05/20 0556) Resp:  [16-20] 18 (05/20 0556) BP: (101-133)/(52-73) 101/52 (05/20 0556) SpO2:  [95 %-98 %] 96 % (05/20 0556)  General: WDWN Caucasian man in NAD. He  is alert, oriented to self, Zacarias Pontes, month and year.  Speech is clear without dysarthria. Affect is slightly irritable.  HEENT: Neck is supple without lymphadenopathy. He denies any muscle tenderness today. Mucous membranes are dry and the oropharynx is clear. Sclerae are anicteric. There is no conjunctival injection.  CV: Regular, no murmur. Carotid pulses are 2+ and symmetric with no bruits. Distal pulses 2+ and symmetric.  Lungs: CTAB  Extremities: No C/C/E. Neuro: MS: As noted above.  CN: Pupils are equal and reactive from 3-->2 mm bilaterally. EOMI, no nystagmus. There is breakup of  smooth pursuits in all directions. Facial sensation is intact to light touch. He has mild R facial weakness with occasional tics. 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 Voice is normal in tone and quality. Palate elevates symmetrically. Uvula is midline. Bilateral SCM and trapezii are 5/5. Tongue is midline with normal bulk and mobility.  Motor: Normal bulk, tone, and strength throughout. No pronator drift. No tremor or other abnormal movements are observed.  Sensation: Intact to light touch.  DTRs: 3+, symmetric. Toes are downgoing bilaterally. No pathological reflexes.  Coordination: Finger-to-nose is slow but without dysmetria bilaterally.  Gait: His gait is slightly slow with a mildly widened base.   Labs: Lab Results  Component Value Date   WBC 10.3 06/28/2016   HGB 10.8 (L) 06/28/2016   HCT 32.7 (L) 06/28/2016   PLT 323 06/28/2016   GLUCOSE 122 (H) 07/01/2016   TRIG 101 06/25/2016   ALT 15 (L) 06/23/2016   AST 25 06/23/2016   NA 139 07/01/2016   K 3.0 (L) 07/01/2016   CL 103 07/01/2016   CREATININE 0.65 07/01/2016   BUN 5 (L) 07/01/2016   CO2 26 07/01/2016   TSH 1.197 06/12/2016   INR 0.95 06/12/2016   HGBA1C 5.3 06/12/2016   CBC Latest Ref Rng & Units 06/28/2016 06/27/2016 06/26/2016  WBC 4.0 - 10.5 K/uL 10.3 9.3 8.5  Hemoglobin 13.0 - 17.0 g/dL 10.8(L) 10.8(L) 10.9(L)  Hematocrit 39.0 - 52.0 % 32.7(L) 32.2(L) 32.8(L)  Platelets 150 - 400 K/uL 323 319 296    Lab Results  Component Value Date   HGBA1C 5.3 06/12/2016   Lab Results  Component Value Date   ALT 15 (L) 06/23/2016   AST 25 06/23/2016   ALKPHOS 48 06/23/2016   BILITOT 1.1 06/23/2016    Radiology:  There is no new neuroimaging for review.   A/P:   1. 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 He has been seizure free since 5/11. Continue Depakote 500 mg TID. Continue seizure precautions. Seizure precautions discussed  with the patient.  Per [facility] statutes, patients with seizures are not allowed to drive until they have been seizure-free for six months. Use caution when using heavy equipment or power tools. Avoid working on ladders or at heights. Take showers instead of baths. Ensure the water temperature is not too high on the home water heater. Do not go swimming alone. When caring for infants or small children, sit down when holding, feeding, or changing them to minimize risk of injury to the child in the event you have a seizure.   2. Acute encephalopathy: His mental status is better today. I suspect that he is at his baseline. He does demonstrate some impairment in sustained attention, visual processing, spatial processing, and executive function. I do not know what his normal baseline is, however. Continue  supportive care, optimizing metabolic status and treating any underlying infection as needed. Minimize CNS active medications as much as possible. Continue to optimize sleep-wake cycles.  3. 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 It is not clear what his precise neuropsychiatric and cognitive baselines are. He has some obvious difficulties with sustained attention, word finding, visual processing, spatial processing, executive function, and speed of processing. In addition, he has an apparent history of violent and aggressive behavior with multiple incarcerations reported. Continue supportive care as above.  No family was present at the time of my visit.  Melba Coon, MD [facility]

## 2016-07-01 NOTE — Progress Notes (Signed)
Pt  had a good night. C/o of wants the doctor d/c him home to sort things out with the court. Pt had some period of anxiety and wanted bed alarm off since it was blinking and disturbing his sleep ,Nsg unable to fixed the alarm currently. Pt instructed to call for staff presents  In room before he gets OOB and this he complied..  Pt's gait and strength has improved.   Ambulates in room with walker or just a stand by.assist  with steady gait.

## 2016-07-01 NOTE — Discharge Instructions (Addendum)
Clifford LocusSteven L Morris,  You were admitted because of seizures. You required the use of a breathing machine because of these seizures. You were also found to have a pneumonia. You are being treated with antibiotics. Please follow-up with your PCP and the neurologist.  Per [facility] DM[place] statutes, patients with seizures are not allowed to drive until they have been seizure-free for six months. Use caution when using heavy equipment or power tools. Avoid working on ladders or at heights. Take showers instead of baths. Ensure the water temperature is not too high on the home water heater. Do not go swimming alone. When caring for infants or small children, sit down when holding, feeding, or changing them to minimize risk of injury to the child in the event you have a seizure.

## 2016-07-01 NOTE — Progress Notes (Signed)
OT Cancellation Note  Patient Details Name: [name] MRN: [id number] DOB: 11-27-1965   Cancelled Treatment:    Reason Eval/Treat Not Completed: Patient declined, no reason specified.   Evern BioLaura J Estrella Alcaraz 07/01/2016, 10:52 AM  Sherryl MangesLaura Octavian Godek OTR/L [phone]

## 2016-07-01 NOTE — Clinical Social Work Note (Signed)
Patient is refusing SNF, CSW to sign off.  Ervin KnackEric R. Danthony Kendrix, MSW, [employer]CSWA [phone]

## 2016-07-04 NOTE — Discharge Summary (Signed)
Physician Discharge Summary  [name]:[id number] DOB: 1966-02-04 DOA: 06/12/2016  PCP: Kari Baars, MD  Admit date: 06/12/2016 Discharge date: 07/04/2016  Admitted From: unknown Disposition: Home/ex-wife  Recommendations for Outpatient Follow-up:  1. Follow up with PCP in 1 week 2. Follow up with neurology 3. Please obtain BMP/CBC in one week to recheck potassium 4. Please follow up on the following pending results: None  Home Health: None Equipment/Devices: None  Discharge Condition: Stable CODE STATUS: Full code Diet recommendation: Heart healthy   Brief/Interim Summary:  Admission HPI written by Roslynn Amble, MD   Reason for Consult/Chief Complaint:  Septic Shock with Seizure  History of Presenting Illness:  History obtained from his outside physician & electronic medical record given his intubated status. 51 y.o. male with known history of traumatic brain injury and seizure disorder as well as underlying COPD. Presented to the emergency department after recurrent seizures. Patient recently was released from jail and had been out of his seizure medications including Topamax and Depakote. While drinking alcohol with friends earlier in the day he was noted to have 2 separate generalized seizure like episodes and EMS was called. Upon arrival they witnessed a third generalized seizure and 2 mg of Ativan was administered. Patient was subsequently transported to the hospital by EMS. Upon further investigation the patient had a seizure approximately 3 weeks prior to presentation as well. Patient was afebrile on presentation saturating normally on room air. In the emergency department he had another seizure with a brief postictal period. He was given his usual dose of oral Topamax. While in the emergency department he had repeated episodes of seizures and was unable to protect his airway thus was intubated by the emergency department physician. Patient was extubated the same  day while nurse was performing oral care. He was subsequently transitioned to BiPAP. Patient was evaluated by neurology on 5/2 and had no epileptiform activity on EEG. Patient was placed on maintenance Depakote and Keppra. Per documentation patient remained on BiPAP support and was significantly encephalopathic. Patient was treated with a Precedex infusion while admitted. His level of alertness improved and patient became less combative. On 5/10 the patient developed ventricular tachycardia with a prolonged QT while on Haldol and with hypokalemia. Haldol was discontinued and patient was given magnesium sulfate. Patient was ultimately transitioned off Precedex with borderline oxygenation. Patient had further seizure activity on 5/11 with subsequent hypoxia requiring endotracheal reintubation on 5/11. He developed increasing secretions likely from aspiration pneumonia and with hypotension responsive to IV fluids was transferred to our facility for further critical care support and assessment by neurology.    Hospital course:  Seizures Multifactorial secondary to alcohol abuse and medication non-adherence. Initialaly required ICU admission and intubation (5/11 to 5/19). Seizures controlled with keppra and Depakote initially. Vimpat addded and eventually transitioned to just Depakote. Patient remained seizure free.  MRSA HCAP Culture positive. Initially treated empirically with vancomycin and Zosyn. Zosyn discontinued with positive culture and eventually transitioned to doxycycline to complete 10 days.  Alcohol abuse Monitored for withdrawal while in the hospital. Patient states he is not going to drink anymore.  History of traumatic brain injury Altered mental status Altered mental status secondary to seizures. Slow to return to baseline, however, at baseline prior to discharge.  Protein-calorie malnutrition Protein supplement  COPD Not in exacerbation. Duoneb as  needed.  Hypokalemia Supplementation.  Discharge Diagnoses:  Principal Problem:   Seizures (HCC) Active Problems:   Alcohol abuse with intoxication (HCC)   COPD (  chronic obstructive pulmonary disease) (HCC)   History of traumatic brain injury   Endotracheally intubated   Respiratory failure (HCC)   Aspiration pneumonia (HCC)   Metabolic encephalopathy   Protein-calorie malnutrition, severe    Discharge Instructions  Discharge Instructions    Call MD for:  difficulty breathing, headache or visual disturbances    Complete by:  As directed    Call MD for:  persistant dizziness or light-headedness    Complete by:  As directed    Call MD for:  temperature >100.4    Complete by:  As directed    Diet - low sodium heart healthy    Complete by:  As directed    Increase activity slowly    Complete by:  As directed      Allergies as of 07/01/2016      Reactions   Oxycodone-acetaminophen    Severe rash and itching   Oxycodone Hcl    itching   Percocet [oxycodone-acetaminophen] Other (See Comments)   itching      Medication List    STOP taking these medications   oxyCODONE-acetaminophen 10-325 MG tablet Commonly known as:  PERCOCET     TAKE these medications   albuterol 108 (90 Base) MCG/ACT inhaler Commonly known as:  PROVENTIL HFA;VENTOLIN HFA Inhale 2 puffs into the lungs every 6 (six) hours as needed. For shortness of breath   ALPRAZolam 1 MG tablet Commonly known as:  XANAX Take 1 mg by mouth 3 (three) times daily as needed for anxiety.   divalproex 500 MG DR tablet Commonly known as:  DEPAKOTE Take 1 tablet (500 mg total) by mouth every 8 (eight) hours.   feeding supplement Liqd Take 1 Container by mouth 3 (three) times daily between meals.   folic acid 1 MG tablet Commonly known as:  FOLVITE Take 1 tablet (1 mg total) by mouth daily.   metoprolol tartrate 25 MG tablet Commonly known as:  LOPRESSOR Take 0.5 tablets (12.5 mg total) by mouth 2 (two)  times daily.   multivitamin with minerals Tabs tablet Take 1 tablet by mouth daily.   PARoxetine 20 MG tablet Commonly known as:  PAXIL Take 1 tablet (20 mg total) by mouth daily.   thiamine 100 MG tablet Take 1 tablet (100 mg total) by mouth daily.     ASK your doctor about these medications   doxycycline 100 MG tablet Commonly known as:  VIBRA-TABS Take 1 tablet (100 mg total) by mouth every 12 (twelve) hours. Ask about: Should I take this medication?      Follow-up Information    Kari Baars, MD. Schedule an appointment as soon as possible for a visit in 1 week(s).   Specialty:  Pulmonary Disease Contact information: [address]  [place] [address] [phone]        [facility]. Schedule an appointment as soon as possible for a visit in 4 week(s).   Contact information: [address]     [address] [place] [address] [phone]         Allergies  Allergen Reactions  . Oxycodone-Acetaminophen     Severe rash and itching  . Oxycodone Hcl     itching  . Percocet [Oxycodone-Acetaminophen] Other (See Comments)    itching    Consultations:  Neurology   Procedures/Studies: Dg Forearm Right  Result Date: 06/13/2016 CLINICAL DATA:  Pain, combative EXAM: RIGHT FOREARM - 2 VIEW COMPARISON:  None. FINDINGS: There is no evidence of fracture  or other focal bone lesions. Soft tissues are unremarkable. IMPRESSION: Negative. Electronically Signed   By: Jasmine Pang M.D.   On: 06/13/2016 03:31   Ct Head Wo Contrast  Result Date: 06/22/2016 CLINICAL DATA:  Code fixation. Hypoxia. Recent aspiration pneumonia. History of traumatic brain injury. EXAM: CT HEAD WITHOUT CONTRAST TECHNIQUE: Contiguous axial images were obtained from the base of the skull through the vertex without intravenous contrast. COMPARISON:  06/12/2016 and 05/27/2011 FINDINGS: Brain: Ventricles, cisterns and other CSF spaces are  normal. There is no mass, mass effect, shift of midline structures or acute hemorrhage. No evidence of acute infarction. Vascular: Within normal. Skull: Within normal. Sinuses/Orbits: Within normal. Other: None. IMPRESSION: No acute intracranial findings. Electronically Signed   By: Elberta Fortis M.D.   On: 06/22/2016 15:15   Ct Head Wo Contrast  Result Date: 06/12/2016 CLINICAL DATA:  Seizures EXAM: CT HEAD WITHOUT CONTRAST TECHNIQUE: Contiguous axial images were obtained from the base of the skull through the vertex without intravenous contrast. COMPARISON:  05/27/2011 CT FINDINGS: Brain: No evidence of acute infarction, hemorrhage, hydrocephalus, extra-axial collection or mass lesion/mass effect. Vascular: No hyperdense vessel or unexpected calcification. Skull: Normal. Negative for fracture or focal lesion. Sinuses/Orbits: No acute finding. Mild mucosal thickening of the ethmoid sinus. Small mucous retention cyst of the anterior left maxillary sinus. Symmetric appearance of orbits. Punctate calcification along the posterior aspect of the right globe at the insertion of the right optic nerve consistent with drusen is stable. Other: Clear mastoids bilaterally. IMPRESSION: No acute intracranial abnormality. Electronically Signed   By: Tollie Eth M.D.   On: 06/12/2016 14:22   Dg Chest Port 1 View  Result Date: 06/28/2016 CLINICAL DATA:  Respiratory failure. EXAM: PORTABLE CHEST 1 VIEW COMPARISON:  06/27/2016. FINDINGS: Interim extubation and removal of NG tube. Right PICC line stable position. Heart size normal persistent mild right base infiltrate. No pleural effusion or pneumothorax. IMPRESSION: 1. Interim extubation and removal of NG tube. Right PICC line stable position. 2. Persistent mild right base infiltrate. Electronically Signed   By: Maisie Fus  Register   On: 06/28/2016 06:37   Dg Chest Port 1 View  Result Date: 06/27/2016 CLINICAL DATA:  Ventilator support.  Respiratory failure. EXAM: PORTABLE  CHEST 1 VIEW COMPARISON:  06/26/2016 FINDINGS: Endotracheal tube tip is 3 cm above the carina. Nasogastric tube enters stomach. Right arm PICC tip is at the SVC RA junction. There is improving infiltrate/atelectasis in both lower lobes right more than left. No worsening or new findings. IMPRESSION: Lines and tubes well positioned. Improving infiltrate/ atelectasis in the lower lobes. Electronically Signed   By: Paulina Fusi M.D.   On: 06/27/2016 07:20   Dg Chest Port 1 View  Result Date: 06/26/2016 CLINICAL DATA:  Respiratory failure. EXAM: PORTABLE CHEST 1 VIEW COMPARISON:  06/25/2016. FINDINGS: Endotracheal tube, NG tube, right PICC line in stable position. Heart size normal. Right lung base persistent infiltrate. No pleural effusion or pneumothorax. IMPRESSION: 1.  Lines and tubes in stable position. 2. Right lung base persistent infiltrate. No change from prior exam. Electronically Signed   By: Maisie Fus  Register   On: 06/26/2016 06:37   Dg Chest Port 1 View  Result Date: 06/25/2016 CLINICAL DATA:  Respiratory failure. EXAM: PORTABLE CHEST 1 VIEW COMPARISON:  06/24/2016 . FINDINGS: Endotracheal tube, NG tube, right PICC line in stable position. Heart size normal. Persistent infiltrate right lung base without interim change. No pleural effusion or pneumothorax. IMPRESSION: 1. Lines and tubes in stable position. 2. Persistent  infiltrate right lung base.  No interim change. Electronically Signed   By: Maisie Fus  Register   On: 06/25/2016 06:54   Dg Chest Port 1 View  Result Date: 06/24/2016 CLINICAL DATA:  Acute respiratory failure EXAM: PORTABLE CHEST 1 VIEW COMPARISON:  Jun 23, 2016 FINDINGS: The ETT terminates 2.2 cm above the carina. Other support apparatus is stable within visualize limits. Increasing opacity in the medial right lung base. No other change. IMPRESSION: 1. Support apparatus as above. 2. Increasing opacity in the medial right lung base. Developing pneumonia not excluded. Recommend clinical  correlation and attention on follow-up. Electronically Signed   By: Gerome Sam III M.D   On: 06/24/2016 07:40   Dg Chest Port 1 View  Result Date: 06/23/2016 CLINICAL DATA:  Acute respiratory failure with hypoxia. ETT present. Hx of asthma, COPD, seizures. EXAM: PORTABLE CHEST 1 VIEW COMPARISON:  Chest x-rays dated 06/23/2016 and 06/22/2016. FINDINGS: Endotracheal tube remains well positioned with tip approximately 3 cm above the carina. Nasogastric tube passes below the diaphragm. Right-sided PICC line is well positioned with tip at the level of the lower SVC/cavoatrial junction. Heart size is normal. Mild central pulmonary vascular congestion without overt alveolar pulmonary edema. No evidence of pneumonia. No pleural effusion or pneumothorax seen. IMPRESSION: 1. Support apparatus appears appropriately positioned. 2. Mild central pulmonary vascular congestion without overt alveolar pulmonary edema. No evidence of pneumonia. No pleural effusion or pneumothorax seen. Electronically Signed   By: Bary Richard M.D.   On: 06/23/2016 13:08   Dg Chest Port 1 View  Result Date: 06/23/2016 CLINICAL DATA:  Respiratory failure EXAM: PORTABLE CHEST 1 VIEW COMPARISON:  Jun 22, 2016 FINDINGS: Support apparatus is in good position. No pneumothorax. The cardiomediastinal silhouette is normal. No acute pulmonary opacities identified. IMPRESSION: Stable support apparatus. Electronically Signed   By: Gerome Sam III M.D   On: 06/23/2016 08:51   Dg Chest Port 1 View  Result Date: 06/22/2016 CLINICAL DATA:  Encounter for intubation. EXAM: PORTABLE CHEST 1 VIEW COMPARISON:  Radiograph earlier this day at 1129 hour FINDINGS: Endotracheal tube 4.3 cm from the carina. Enteric tube in place, tip below the diaphragm not included in the field of view. Right upper extremity PICC tip in the region of the distal SVC. Volume loss in the right lung base appears new. Improved perihilar opacities from prior exam. Unchanged  heart size and mediastinal contours. No evidence of pneumothorax. IMPRESSION: 1. Endotracheal tube 4.3 cm from the carina. Enteric tube and right upper extremity PICC in place. 2. Volume loss at the right lung base concerning for partial lobar atelectasis/collapse. 3. Otherwise improving perihilar opacities likely improving edema. Electronically Signed   By: Rubye Oaks M.D.   On: 06/22/2016 20:50   Dg Chest Port 1 View  Result Date: 06/22/2016 CLINICAL DATA:  Intubation EXAM: PORTABLE CHEST 1 VIEW COMPARISON:  06/13/2016 FINDINGS: Endotracheal tube is 6 cm above the carina. NG tube is in the stomach. Right PICC line is in place with the tip near the cavoatrial junction. Mild perihilar airspace opacities are noted, left greater than right. Heart is normal size. No effusions. IMPRESSION: Endotracheal tube 6 cm above the carina. Mild perihilar airspace opacities, left greater than right, likely mild edema. Electronically Signed   By: Charlett Nose M.D.   On: 06/22/2016 11:44   Portable Chest 1 View  Result Date: 06/13/2016 CLINICAL DATA:  History of COPD and asthma EXAM: PORTABLE CHEST 1 VIEW COMPARISON:  06/12/2016 FINDINGS: Non inclusion of the right  CP angle. Removal of endotracheal and esophageal tubes. The left lung is grossly clear. Hazy atelectasis or infiltrate at the right base. Stable cardiomediastinal silhouette. IMPRESSION: Removal of endotracheal and esophageal tubes. Slight increased hazy atelectasis or infiltrate at the right base Electronically Signed   By: Jasmine PangKim  Fujinaga M.D.   On: 06/13/2016 03:30   Dg Chest Portable 1 View  Result Date: 06/12/2016 CLINICAL DATA:  Intubation. EXAM: PORTABLE CHEST 1 VIEW COMPARISON:  10/18/2010. FINDINGS: NG tube noted with its tip over the lower cervical esophagus. Repositioning suggested. Endotracheal tube tip noted approximately 5 cm above the carina. Low lung volumes. Bibasilar pulmonary infiltrates cannot be excluded. No pleural effusion or  pneumothorax. Radiopacity noted over the right chest consistent with a coin. This may be outside the patient. Clinical correlation suggested. IMPRESSION: 1. NG tube noted with its tip over the lower cervical esophagus. Repositioning suggested. Endotracheal tube tip noted approximately 5 cm above the carina . 2. Low lung volumes with basilar atelectasis. Mild bibasilar infiltrates cannot be excluded. 3. Radiopacity noted over the right chest consistent with a coin. This may be extrinsic to the patient. Clinical correlation suggested. Critical Value/emergent results were called by telephone at the time of interpretation on 06/12/2016 at 1:48 pm to Dr. Marily MemosJASON MESNER , who verbally acknowledged these results. Electronically Signed   By: Maisie Fushomas  Register   On: 06/12/2016 13:49   Dg Chest Port 1v Same Day  Result Date: 06/12/2016 CLINICAL DATA:  Endotracheal tube change out due to cuff leak. EXAM: PORTABLE CHEST 1 VIEW COMPARISON:  Portable chest x-ray of today's date FINDINGS: The endotracheal tube tip lies just below the inferior margin of the clavicular heads approximately 4.4 cm above the carina. The esophagogastric tube tip in proximal port lie in the gastric cardia. The cardiac silhouette is normal in size. The pulmonary vascularity is not engorged. The lungs remain mildly hypoinflated. The left lung base is clear dirt on this study than earlier today. There is persistent density at the right lung base. IMPRESSION: Interval replacement of the endotracheal tube with adequate radiographic positioning. Advancement of the esophagogastric tube has occurred such that the proximal port and tip are in the cardia. Electronically Signed   By: David  SwazilandJordan M.D.   On: 06/12/2016 15:47   Dg Shoulder Left  Result Date: 06/13/2016 CLINICAL DATA:  Combative, history of arthritis EXAM: LEFT SHOULDER - 2+ VIEW COMPARISON:  02/13/2015 FINDINGS: Borderline widening of the left AC joint. The left lung apex is clear. Normal  positioning of the left humeral head. IMPRESSION: 1. No acute fracture. 2. Borderline to mild widening of the [facility] joint. Electronically Signed   By: Jasmine PangKim  Fujinaga M.D.   On: 06/13/2016 03:32      Subjective: Patient reports no issues overnight. Seizure free. Afebrile.  Discharge Exam: Vitals:   07/01/16 0556 07/01/16 0915  BP: (!) 101/52 116/81  Pulse: 76 95  Resp: 18 16  Temp: 98.8 F (37.1 C) 98.2 F (36.8 C)   Vitals:   06/30/16 2206 07/01/16 0125 07/01/16 0556 07/01/16 0915  BP: 129/65 133/71 (!) 101/52 116/81  Pulse: 87 77 76 95  Resp: 18 18 18 16   Temp: 98.2 F (36.8 C) 98.5 F (36.9 C) 98.8 F (37.1 C) 98.2 F (36.8 C)  TempSrc: Oral Oral Oral Oral  SpO2: 98% 97% 96% 98%  Weight:      Height:        General: Pt is alert, awake, not in acute distress Cardiovascular: RRR, S1/S2 +,  no rubs, no gallops Respiratory: CTA bilaterally, no wheezing, no rhonchi Abdominal: Soft, NT, ND, bowel sounds + Extremities: no edema, no cyanosis Psych: alert and oriented x3    The results of significant diagnostics from this hospitalization (including imaging, microbiology, ancillary and laboratory) are listed below for reference.      Labs: Basic Metabolic Panel:  Recent Labs Lab 06/28/16 0443 06/29/16 1318 07/01/16 0411  NA 141 141 139  K 3.3* 3.2* 3.0*  CL 105 106 103  CO2 27 26 26   GLUCOSE 115* 151* 122*  BUN 12 9 5*  CREATININE 0.69 0.75 0.65  CALCIUM 8.4* 8.5* 8.4*  MG 2.1 2.1  --   PHOS 3.0 3.3  --    CBC:  Recent Labs Lab 06/28/16 0443  WBC 10.3  HGB 10.8*  HCT 32.7*  MCV 94.5  PLT 323   CBG:  Recent Labs Lab 06/27/16 2320 06/28/16 0411 06/28/16 0725 06/28/16 1138 07/01/16 0612  GLUCAP 107* 89 89 109* 94   Urinalysis    Component Value Date/Time   COLORURINE AMBER (A) 06/23/2016 1309   APPEARANCEUR HAZY (A) 06/23/2016 1309   LABSPEC 1.026 06/23/2016 1309   PHURINE 5.0 06/23/2016 1309   GLUCOSEU NEGATIVE 06/23/2016 1309   HGBUR  NEGATIVE 06/23/2016 1309   BILIRUBINUR NEGATIVE 06/23/2016 1309   KETONESUR 5 (A) 06/23/2016 1309   PROTEINUR 30 (A) 06/23/2016 1309   UROBILINOGEN 0.2 02/04/2010 0510   NITRITE NEGATIVE 06/23/2016 1309   LEUKOCYTESUR NEGATIVE 06/23/2016 1309    Time coordinating discharge: Over 30 minutes  SIGNED:   Jacquelin Hawking, MD [facility] 07/04/2016, 6:58 PM Pager [phone]  If 7PM-7AM, please contact night-coverage [email or web address] Password [email or web address]

## 2016-07-08 ENCOUNTER — Emergency Department (HOSPITAL_COMMUNITY)
Admission: EM | Admit: 2016-07-08 | Discharge: 2016-07-08 | Disposition: A | Discharge status: Home / Self Care | Payer: Self-pay | Attending: Emergency Medicine | Admitting: Emergency Medicine

## 2016-07-08 DIAGNOSIS — J449 Chronic obstructive pulmonary disease, unspecified: Secondary | ICD-10-CM | POA: Insufficient documentation

## 2016-07-08 DIAGNOSIS — J45998 Other asthma: Secondary | ICD-10-CM | POA: Insufficient documentation

## 2016-07-08 DIAGNOSIS — K029 Dental caries, unspecified: Secondary | ICD-10-CM | POA: Insufficient documentation

## 2016-07-08 DIAGNOSIS — Z79899 Other long term (current) drug therapy: Secondary | ICD-10-CM | POA: Insufficient documentation

## 2016-07-08 DIAGNOSIS — F17210 Nicotine dependence, cigarettes, uncomplicated: Secondary | ICD-10-CM | POA: Insufficient documentation

## 2016-07-08 DIAGNOSIS — K047 Periapical abscess without sinus: Secondary | ICD-10-CM | POA: Insufficient documentation

## 2016-07-08 MED ORDER — IBUPROFEN 800 MG PO TABS
800.0000 mg | ORAL_TABLET | Freq: Once | ORAL | Status: AC
Start: 2016-07-08 — End: 2016-07-08
  Administered 2016-07-08: 800 mg via ORAL
  Filled 2016-07-08: qty 1

## 2016-07-08 MED ORDER — IBUPROFEN 600 MG PO TABS: 600.0000 mg | ORAL_TABLET | Freq: Four times a day (QID) | ORAL | 0 refills | Status: DC

## 2016-07-08 MED ORDER — CLINDAMYCIN HCL 150 MG PO CAPS
300.0000 mg | ORAL_CAPSULE | Freq: Once | ORAL | Status: AC
Start: 2016-07-08 — End: 2016-07-08
  Administered 2016-07-08: 300 mg via ORAL
  Filled 2016-07-08: qty 2

## 2016-07-08 MED ORDER — ONDANSETRON HCL 4 MG PO TABS
4.0000 mg | ORAL_TABLET | Freq: Once | ORAL | Status: AC
  Administered 2016-07-08: 4 mg via ORAL
  Filled 2016-07-08: qty 1

## 2016-07-08 MED ORDER — HYDROCODONE-ACETAMINOPHEN 5-325 MG PO TABS
2.0000 | ORAL_TABLET | Freq: Once | ORAL | Status: AC
  Administered 2016-07-08: 2 via ORAL
  Filled 2016-07-08: qty 2

## 2016-07-08 MED ORDER — HYDROCODONE-ACETAMINOPHEN 5-325 MG PO TABS: 1.0000 | ORAL_TABLET | ORAL | 0 refills | Status: DC | PRN

## 2016-07-08 MED ORDER — CLINDAMYCIN HCL 150 MG PO CAPS: 150.0000 mg | ORAL_CAPSULE | Freq: Four times a day (QID) | ORAL | 0 refills | Status: DC

## 2016-07-08 NOTE — Discharge Instructions (Signed)
You have an abscess on the right lower jaw area. You have cavities present as well. It is important that she see a dentist as soon as possible. Please use clindamycin and ibuprofen with breakfast, lunch, dinner, and at bedtime. May use Norco for more severe pain. Please see Dr. Juanetta GoslingHawkins for additional pain management if needed before you're seen by the dentist.

## 2016-07-08 NOTE — ED Triage Notes (Signed)
Dental pain to rt lower side since yesterday

## 2016-07-08 NOTE — ED Provider Notes (Signed)
[facility]EMER[facility]Y DEPT Provider Note   CSN: [id number] Arrival date & time: 07/08/16  1829  By signing my name below, I, [name], attest that this documentation has been prepared under the direction and in the presence of [name], [place] Electronically Signed: Deland Morris, ED Scribe. 07/08/16. 7:12 PM.  History   Chief Complaint Chief Complaint  Patient presents with  . Dental Pain    The history is provided by the patient.  Dental Pain   This is a new problem. The current episode started 2 days ago. The problem occurs constantly. The problem has been gradually worsening. The pain is moderate. He has tried acetaminophen for the symptoms. The treatment provided no relief.   HPI Comments: [name] is a 51 y.o. male who presents to the Emergency Department complaining of moderate right lower sided dental pain that began gradually worsening 2 day ago. The pt states that he had a seizure and fell about a month ago. Pt has associated facial swelling to the area. He reports that he has taken OTC medications with inadequate relief. The pt denies fever   Past Medical History:  Diagnosis Date  . Arthritis   . Asthma   . Chronic neck pain   . COPD (chronic obstructive pulmonary disease) (HCC)   . Injury of jawline   . Mental disorder   . Ruptured disc, cervical   . Seizures (HCC)   . Torn rotator cuff     Patient Active Problem List   Diagnosis Date Noted  . Protein-calorie malnutrition, severe 06/20/2016  . Aspiration pneumonia (HCC) 06/15/2016  . Metabolic encephalopathy 06/15/2016  . Alcohol abuse with intoxication (HCC) 06/12/2016  . COPD (chronic obstructive pulmonary disease) (HCC) 06/12/2016  . History of traumatic brain injury 06/12/2016  . Seizures (HCC) 06/12/2016  . Endotracheally intubated   . Respiratory failure (HCC)     No past surgical history on file.     Home Medications    Prior to Admission medications   Medication Sig Start Date  End Date Taking? Authorizing Provider  albuterol (PROVENTIL HFA;VENTOLIN HFA) 108 (90 BASE) MCG/ACT inhaler Inhale 2 puffs into the lungs every 6 (six) hours as needed. For shortness of breath    [provider]  ALPRAZolam (XANAX) 1 MG tablet Take 1 mg by mouth 3 (three) times daily as needed for anxiety.    [provider]  divalproex (DEPAKOTE) 500 MG DR tablet Take 1 tablet (500 mg total) by mouth every 8 (eight) hours. 07/01/16   [name], MD  feeding supplement (BOOST / RESOURCE BREEZE) LIQD Take 1 Container by mouth 3 (three) times daily between meals. 07/01/16   [name], MD  folic acid (FOLVITE) 1 MG tablet Take 1 tablet (1 mg total) by mouth daily. 07/02/16   [name], MD  metoprolol tartrate (LOPRESSOR) 25 MG tablet Take 0.5 tablets (12.5 mg total) by mouth 2 (two) times daily. 07/01/16   [name], MD  Multiple Vitamin (MULTIVITAMIN WITH MINERALS) TABS tablet Take 1 tablet by mouth daily. 07/01/16   [name], MD  PARoxetine (PAXIL) 20 MG tablet Take 1 tablet (20 mg total) by mouth daily. 07/02/16   [name], MD  thiamine 100 MG tablet Take 1 tablet (100 mg total) by mouth daily. 07/02/16   [name], MD    Family History Family History  Problem Relation Age of Onset  . Heart failure Father   . Cancer Father   .  Alcoholism Brother     Social History Social History  Substance Use Topics  . Smoking status: Current Some Day Smoker    Types: Cigarettes, Cigars  . Smokeless tobacco: Never Used  . Alcohol use No     Comment: "monthy"     Allergies   Patient has no known allergies.   Review of Systems Review of Systems  Constitutional: Negative for fever.  HENT: Positive for dental problem and facial swelling.      Physical Exam Updated Vital Signs BP (!) 141/91 (BP Location: Right Arm)   Pulse (!) 104   Temp 99.4 F (37.4 C) (Oral)   Resp 19   SpO2 98%   Physical Exam  Constitutional: He is oriented to  person, place, and time. He appears well-developed and well-nourished.  HENT:  Head: Normocephalic.  Eyes: EOM are normal.  Neck: Normal range of motion.  No lymph nodes in deep cervical chain  Cardiovascular: Normal rate, regular rhythm, normal heart sounds and intact distal pulses.  Exam reveals no gallop and no friction rub.   No murmur heard. Pulmonary/Chest: Effort normal and breath sounds normal. No respiratory distress. He has no wheezes. He has no rales. He exhibits no tenderness.  Abdominal: He exhibits no distension.  Musculoskeletal: Normal range of motion.  +Deep cavity of the 2nd pre-molar on right lower jaw +Swelling of the gum with possible abscess formation +Airway is patent and no swelling under the tongue +Few palpable nodes in submental area.  Neurological: He is alert and oriented to person, place, and time.  Skin: No rash noted.  No rash on the palms.  Psychiatric: He has a normal mood and affect.  Nursing note and vitals reviewed.    ED Treatments / Results   DIAGNOSTIC STUDIES: Oxygen Saturation is 98% on RA, normal by my interpretation.   COORDINATION OF CARE: 7:07 PM-Discussed next steps with pt. Pt verbalized understanding and is agreeable with the plan.   Labs (all labs ordered are listed, but only abnormal results are displayed) Labs Reviewed - No data to display  EKG  EKG Interpretation None       Radiology No results found.  Procedures Procedures (including critical care time)  Medications Ordered in ED Medications - No data to display   Initial Impression / Assessment and Plan / ED Course  I have reviewed the triage vital signs and the nursing notes.  Pertinent labs & imaging results that were available during my care of the patient were reviewed by me and considered in my medical decision making (see chart for details).       Final Clinical Impressions(s) / ED Diagnoses   MDM Patient noted to have a dental abscess  related to dental caries. Vital signs reviewed. Patient will be treated with Cleocin and ibuprofen. Patient given 12 tablets of hydrocodone to assist with his discomfort. Patient strongly advised to see a dentist as sone as possible. Patient will return to the emergency department if any emergent changes before seeing the dentist. Final diagnoses:  Dental abscess  Dental caries    New Prescriptions Discharge Medication List as of 07/08/2016  7:17 PM    START taking these medications   Details  clindamycin (CLEOCIN) 150 MG capsule Take 1 capsule (150 mg total) by mouth every 6 (six) hours., Starting Sun 07/08/2016, Print    HYDROcodone-acetaminophen (NORCO/VICODIN) 5-325 MG tablet Take 1 tablet by mouth every 4 (four) hours as needed., Starting Sun 07/08/2016, Print    ibuprofen (ADVIL,MOTRIN)  600 MG tablet Take 1 tablet (600 mg total) by mouth 4 (four) times daily., Starting Sun 07/08/2016, Print       **I personally performed the services described in this documentation, which was scribed in my presence. The recorded information has been reviewed and is accurate.Clifford Quale, PA-C 07/09/16 Acie Fredrickson, MD 07/15/16 [phone]

## 2016-07-15 ENCOUNTER — Emergency Department (HOSPITAL_COMMUNITY)
Admission: EM | Admit: 2016-07-15 | Discharge: 2016-07-15 | Disposition: A | Discharge status: Home / Self Care | Payer: Self-pay | Attending: Emergency Medicine | Admitting: Emergency Medicine

## 2016-07-15 ENCOUNTER — Encounter (HOSPITAL_COMMUNITY): Payer: Self-pay | Admitting: *Deleted

## 2016-07-15 DIAGNOSIS — F17210 Nicotine dependence, cigarettes, uncomplicated: Secondary | ICD-10-CM | POA: Insufficient documentation

## 2016-07-15 DIAGNOSIS — Y998 Other external cause status: Secondary | ICD-10-CM | POA: Insufficient documentation

## 2016-07-15 DIAGNOSIS — Z79899 Other long term (current) drug therapy: Secondary | ICD-10-CM | POA: Insufficient documentation

## 2016-07-15 DIAGNOSIS — W260XXA Contact with knife, initial encounter: Secondary | ICD-10-CM | POA: Insufficient documentation

## 2016-07-15 DIAGNOSIS — S41112A Laceration without foreign body of left upper arm, initial encounter: Secondary | ICD-10-CM | POA: Insufficient documentation

## 2016-07-15 DIAGNOSIS — Y92009 Unspecified place in unspecified non-institutional (private) residence as the place of occurrence of the external cause: Secondary | ICD-10-CM | POA: Insufficient documentation

## 2016-07-15 DIAGNOSIS — Y9389 Activity, other specified: Secondary | ICD-10-CM | POA: Insufficient documentation

## 2016-07-15 DIAGNOSIS — J45909 Unspecified asthma, uncomplicated: Secondary | ICD-10-CM | POA: Insufficient documentation

## 2016-07-15 DIAGNOSIS — J449 Chronic obstructive pulmonary disease, unspecified: Secondary | ICD-10-CM | POA: Insufficient documentation

## 2016-07-15 MED ORDER — LIDOCAINE-EPINEPHRINE (PF) 2 %-1:200000 IJ SOLN: 20.0000 mL | Freq: Once | INTRAMUSCULAR | Status: DC
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
## 2016-07-15 NOTE — ED Triage Notes (Signed)
Pt has laceration to left upper arm and cut to left hand

## 2016-07-15 NOTE — Discharge Instructions (Signed)
You were seen today for a laceration to her left arm. This was stapled. You need to have staples removed in 7-10 days. If you develop redness, pain, drainage from the site you need to be reevaluated.

## 2016-08-01 ENCOUNTER — Emergency Department (HOSPITAL_COMMUNITY)
Admission: EM | Admit: 2016-08-01 | Discharge: 2016-08-01 | Disposition: A | Discharge status: Home / Self Care | Payer: Medicaid Other | Attending: Emergency Medicine | Admitting: Emergency Medicine

## 2016-08-01 DIAGNOSIS — R569 Unspecified convulsions: Secondary | ICD-10-CM | POA: Insufficient documentation

## 2016-08-01 DIAGNOSIS — J449 Chronic obstructive pulmonary disease, unspecified: Secondary | ICD-10-CM | POA: Insufficient documentation

## 2016-08-01 DIAGNOSIS — F17210 Nicotine dependence, cigarettes, uncomplicated: Secondary | ICD-10-CM | POA: Insufficient documentation

## 2016-08-01 DIAGNOSIS — Z5321 Procedure and treatment not carried out due to patient leaving prior to being seen by health care provider: Secondary | ICD-10-CM | POA: Diagnosis not present

## 2016-08-01 DIAGNOSIS — F17290 Nicotine dependence, other tobacco product, uncomplicated: Secondary | ICD-10-CM | POA: Diagnosis not present

## 2016-08-01 DIAGNOSIS — J45909 Unspecified asthma, uncomplicated: Secondary | ICD-10-CM | POA: Diagnosis not present

## 2016-08-01 NOTE — ED Triage Notes (Addendum)
Pt in via EMS for seizure activity that was witnessed.  Pt alert and oriented.  Pt immediately pulled IV out and came out of room and states he is not staying here.  States he needs a phone and is going to call Dr. Juanetta GoslingHawkins. States "I have been out of my seizure medicine for a day and that is all I need is a refill by Dr. Juanetta GoslingHawkins"   Able to talk pt into staying.  Pt sat back down on stretcher.  Bleeding from IV site.  Held pressure and applied bandage.  Attempting to get pt triaged .  Pt ripped blood pressure cuff off and walked out of ER. States he is going to see Dr. Juanetta GoslingHawkins.  Pt left ambulatory without difficulty or neurological symptoms

## 2016-08-19 ENCOUNTER — Encounter (HOSPITAL_COMMUNITY): Payer: Self-pay | Admitting: Cardiology

## 2016-08-19 ENCOUNTER — Emergency Department (HOSPITAL_COMMUNITY): Payer: Medicaid Other

## 2016-08-19 ENCOUNTER — Emergency Department (HOSPITAL_COMMUNITY)
Admission: EM | Admit: 2016-08-19 | Discharge: 2016-08-19 | Disposition: A | Discharge status: Home / Self Care | Payer: Medicaid Other | Attending: Emergency Medicine | Admitting: Emergency Medicine

## 2016-08-19 DIAGNOSIS — F10229 Alcohol dependence with intoxication, unspecified: Secondary | ICD-10-CM | POA: Diagnosis not present

## 2016-08-19 DIAGNOSIS — J45909 Unspecified asthma, uncomplicated: Secondary | ICD-10-CM | POA: Diagnosis not present

## 2016-08-19 DIAGNOSIS — F17210 Nicotine dependence, cigarettes, uncomplicated: Secondary | ICD-10-CM | POA: Diagnosis not present

## 2016-08-19 DIAGNOSIS — Z79899 Other long term (current) drug therapy: Secondary | ICD-10-CM | POA: Diagnosis not present

## 2016-08-19 DIAGNOSIS — R569 Unspecified convulsions: Secondary | ICD-10-CM | POA: Diagnosis present

## 2016-08-19 DIAGNOSIS — F17290 Nicotine dependence, other tobacco product, uncomplicated: Secondary | ICD-10-CM | POA: Insufficient documentation

## 2016-08-19 DIAGNOSIS — J449 Chronic obstructive pulmonary disease, unspecified: Secondary | ICD-10-CM | POA: Insufficient documentation

## 2016-08-19 DIAGNOSIS — F10920 Alcohol use, unspecified with intoxication, uncomplicated: Secondary | ICD-10-CM

## 2016-08-19 LAB — CBC WITH DIFFERENTIAL/PLATELET
Basophils Absolute: 0 10*3/uL (ref 0.0–0.1)
Basophils Relative: 0 %
EOS PCT: 1 %
Eosinophils Absolute: 0.1 10*3/uL (ref 0.0–0.7)
HEMATOCRIT: 44.3 % (ref 39.0–52.0)
Hemoglobin: 15.6 g/dL (ref 13.0–17.0)
LYMPHS ABS: 3.8 10*3/uL (ref 0.7–4.0)
Lymphocytes Relative: 41 %
MCH: 33.1 pg (ref 26.0–34.0)
MCHC: 35.2 g/dL (ref 30.0–36.0)
MCV: 94.1 fL (ref 78.0–100.0)
MONO ABS: 0.5 10*3/uL (ref 0.1–1.0)
MONOS PCT: 6 %
NEUTROS ABS: 4.7 10*3/uL (ref 1.7–7.7)
Neutrophils Relative %: 52 %
PLATELETS: 257 10*3/uL (ref 150–400)
RBC: 4.71 MIL/uL (ref 4.22–5.81)
RDW: 13.4 % (ref 11.5–15.5)
WBC: 9.1 10*3/uL (ref 4.0–10.5)

## 2016-08-19 LAB — ETHANOL: ALCOHOL ETHYL (B): 246 mg/dL — AB (ref <5)

## 2016-08-19 LAB — COMPREHENSIVE METABOLIC PANEL
ALT: 37 U/L (ref 17–63)
AST: 21 U/L (ref 15–41)
Albumin: 4.1 g/dL (ref 3.5–5.0)
Alkaline Phosphatase: 72 U/L (ref 38–126)
Anion gap: 12 (ref 5–15)
BILIRUBIN TOTAL: 0.6 mg/dL (ref 0.3–1.2)
BUN: 8 mg/dL (ref 6–20)
CHLORIDE: 107 mmol/L (ref 101–111)
CO2: 23 mmol/L (ref 22–32)
CREATININE: 0.76 mg/dL (ref 0.61–1.24)
Calcium: 9.4 mg/dL (ref 8.9–10.3)
Glucose, Bld: 98 mg/dL (ref 65–99)
POTASSIUM: 3.3 mmol/L — AB (ref 3.5–5.1)
Sodium: 142 mmol/L (ref 135–145)
TOTAL PROTEIN: 7.6 g/dL (ref 6.5–8.1)

## 2016-08-19 LAB — CBG MONITORING, ED: Glucose-Capillary: 100 mg/dL — ABNORMAL HIGH (ref 65–99)

## 2016-08-19 LAB — MAGNESIUM: MAGNESIUM: 2.1 mg/dL (ref 1.7–2.4)

## 2016-08-19 LAB — VALPROIC ACID LEVEL

## 2016-08-19 MED ORDER — LORAZEPAM 2 MG/ML IJ SOLN
INTRAMUSCULAR | Status: AC
  Administered 2016-08-19: 2 mg
  Filled 2016-08-19: qty 1

## 2016-08-19 MED ORDER — SODIUM CHLORIDE 0.9 % IV BOLUS (SEPSIS): 1000.0000 mL | Freq: Once | INTRAVENOUS | Status: DC

## 2016-08-19 MED ORDER — VALPROATE SODIUM 500 MG/5ML IV SOLN
INTRAVENOUS | Status: AC
  Filled 2016-08-19: qty 10

## 2016-08-19 MED ORDER — DIVALPROEX SODIUM 500 MG PO DR TAB: 500.0000 mg | DELAYED_RELEASE_TABLET | Freq: Three times a day (TID) | ORAL | 3 refills | Status: DC

## 2016-08-19 MED ORDER — VALPROATE SODIUM 500 MG/5ML IV SOLN
1000.0000 mg | Freq: Once | INTRAVENOUS | Status: AC
  Administered 2016-08-19: 1000 mg via INTRAVENOUS
  Filled 2016-08-19: qty 10

## 2016-08-19 NOTE — ED Notes (Signed)
Pt refusing IV fluids. Dr Verdie MosherLiu in to talk with pt about her concerns for his safety. Pt stated he was leaving and proceeded to pull out his IV. Dr. Verdie MosherLiu requested security and police presence and ordered for pt to have 2mg  Ativan IM. Pt refused ativan at this time.

## 2016-08-19 NOTE — ED Notes (Signed)
Pt continues to refuse to stay in bed.  Up in room and slammed door.  States he is going to get up and "piss"

## 2016-08-19 NOTE — ED Notes (Signed)
Pt has been IVC"d 

## 2016-08-19 NOTE — ED Notes (Signed)
Pt pulled out IV and wanted to leave. IVC paperwork had been rescinded so pt left. [employer] escorted pt out.

## 2016-08-19 NOTE — ED Notes (Signed)
Pt refusing to stay on stretcher .  Calling security.  Unhooking himself from monitor.

## 2016-08-19 NOTE — ED Notes (Signed)
Pt had left [facility] but Dr. Verdie MosherLiu was able to catch pt and his "ride" in parking lot where she was able to go over discharge paperwork.

## 2016-08-19 NOTE — ED Provider Notes (Signed)
[facility]-EMERGENCY DEPT Provider Note   CSN: [id number] Arrival date & time: 08/19/16  1818     History   Chief Complaint Chief Complaint  Patient presents with  . Seizures    HPI [name] is a 51 y.o. male.  HPI  51 year old male with history of alcohol abuse, prior TBI, and seizure disorder who presents with seizures prior to arrival. Per EMS, patient had witnessed seizure by friends just PTA. Received 2.5 mg versed prior to arrival by EMS. Does endorse drinking more than a 6 pack of beer tonight and states he is not supposed to. States he has been taking topomax, prescribed by his PCP, Dr. Juanetta Gosling compliantly. No recent illnesses, including fever, cough, dyspnea, chest pain, abd pain, n/v/d. No illicit drug use. Complains of severe posterior headache.   Past Medical History:  Diagnosis Date  . Arthritis   . Asthma   . Chronic neck pain   . COPD (chronic obstructive pulmonary disease) (HCC)   . Injury of jawline   . Mental disorder   . Ruptured disc, cervical   . Seizures (HCC)   . Torn rotator cuff     Patient Active Problem List   Diagnosis Date Noted  . Protein-calorie malnutrition, severe 06/20/2016  . Aspiration pneumonia (HCC) 06/15/2016  . Metabolic encephalopathy 06/15/2016  . Alcohol abuse with intoxication (HCC) 06/12/2016  . COPD (chronic obstructive pulmonary disease) (HCC) 06/12/2016  . History of traumatic brain injury 06/12/2016  . Seizures (HCC) 06/12/2016  . Endotracheally intubated   . Respiratory failure (HCC)     History reviewed. No pertinent surgical history.     Home Medications    Prior to Admission medications   Medication Sig Start Date End Date Taking? Authorizing Provider  albuterol (PROVENTIL HFA;VENTOLIN HFA) 108 (90 BASE) MCG/ACT inhaler Inhale 2 puffs into the lungs every 6 (six) hours as needed. For shortness of breath   Yes [provider]  ALPRAZolam (XANAX) 1 MG tablet Take 1 mg by mouth 3 (three) times daily as  needed for anxiety.   Yes [provider]  feeding supplement (BOOST / RESOURCE BREEZE) LIQD Take 1 Container by mouth 3 (three) times daily between meals. 07/01/16  Yes [name], MD  folic acid (FOLVITE) 1 MG tablet Take 1 tablet (1 mg total) by mouth daily. 07/02/16  Yes [name], MD  Multiple Vitamin (MULTIVITAMIN WITH MINERALS) TABS tablet Take 1 tablet by mouth daily. 07/01/16  Yes [name], MD  oxyCODONE-acetaminophen (PERCOCET) 10-325 MG tablet Take 1 tablet by mouth every 6 (six) hours as needed for pain.   Yes [provider]  thiamine 100 MG tablet Take 1 tablet (100 mg total) by mouth daily. 07/02/16  Yes [name], MD  topiramate (TOPAMAX) 50 MG tablet Take 50 mg by mouth at bedtime.   Yes [provider]  divalproex (DEPAKOTE) 500 MG DR tablet Take 1 tablet (500 mg total) by mouth every 8 (eight) hours. Patient not taking: Reported on 08/19/2016 07/01/16   [name], MD  divalproex (DEPAKOTE) 500 MG DR tablet Take 1 tablet (500 mg total) by mouth 3 (three) times daily. 08/19/16   [name], MD  HYDROcodone-acetaminophen (NORCO/VICODIN) 5-325 MG tablet Take 1 tablet by mouth every 4 (four) hours as needed. Patient not taking: Reported on 08/19/2016 07/08/16   [name], PA-C  ibuprofen (ADVIL,MOTRIN) 600 MG tablet Take 1 tablet (600 mg total) by mouth 4 (four) times daily.  Patient not taking: Reported on 08/19/2016 07/08/16   [name], PA-C  metoprolol tartrate (LOPRESSOR) 25 MG tablet Take 0.5 tablets (12.5 mg total) by mouth 2 (two) times daily. Patient not taking: Reported on 08/19/2016 07/01/16   [name], MD  PARoxetine (PAXIL) 20 MG tablet Take 1 tablet (20 mg total) by mouth daily. Patient not taking: Reported on 08/19/2016 07/02/16   [name], MD    Family History Family History  Problem Relation Age of Onset  . Heart failure Father   . Cancer Father   . Alcoholism Brother     Social History Social  History  Substance Use Topics  . Smoking status: Current Some Day Smoker    Types: Cigarettes, Cigars  . Smokeless tobacco: Never Used  . Alcohol use No     Comment: "monthy"     Allergies   Patient has no known allergies.   Review of Systems Review of Systems  Constitutional: Negative for fever.  Respiratory: Negative for shortness of breath.   Cardiovascular: Negative for chest pain.  Gastrointestinal: Negative for abdominal pain.  Neurological: Positive for seizures.  All other systems reviewed and are negative.    Physical Exam Updated Vital Signs BP 97/65   Pulse 100   Temp 98.3 F (36.8 C) (Oral)   Resp 17   Ht 6\' 2"  (1.88 m)   Wt 111.1 kg (245 lb)   SpO2 98%   BMI 31.46 kg/m   Physical Exam Physical Exam  Nursing note and vitals reviewed. Constitutional: Well developed, well nourished, non-toxic, appears intoxicated Head: Normocephalic and atraumatic.  Mouth/Throat: Oropharynx is clear and moist.  Neck: Normal range of motion. Neck supple.  Cardiovascular: Normal rate and regular rhythm.   Pulmonary/Chest: Effort normal and breath sounds normal.  Abdominal: Soft. There is no tenderness. There is no rebound and no guarding.  Musculoskeletal: Normal range of motion.  Neurological: Alert, no facial droop, fluent speech, moves all extremities symmetrically, no pronator drift, sensation to light touch in tact throughout.  Skin: Skin is warm and dry.  Psychiatric: Cooperative   ED Treatments / Results  Labs (all labs ordered are listed, but only abnormal results are displayed) Labs Reviewed  VALPROIC ACID LEVEL - Abnormal; Notable for the following:       Result Value   Valproic Acid Lvl <10 (*)    All other components within normal limits  COMPREHENSIVE METABOLIC PANEL - Abnormal; Notable for the following:    Potassium 3.3 (*)    All other components within normal limits  ETHANOL - Abnormal; Notable for the following:    Alcohol, Ethyl (B) 246 (*)     All other components within normal limits  CBG MONITORING, ED - Abnormal; Notable for the following:    Glucose-Capillary 100 (*)    All other components within normal limits  CBC WITH DIFFERENTIAL/PLATELET  MAGNESIUM  URINALYSIS, ROUTINE W REFLEX MICROSCOPIC    EKG  EKG Interpretation None       Radiology Ct Head Wo Contrast  Result Date: 08/19/2016 CLINICAL DATA:  Larey Seat off porch during seizure. Altered mental status and headache. History of alcohol abuse. EXAM: CT HEAD WITHOUT CONTRAST CT CERVICAL SPINE WITHOUT CONTRAST TECHNIQUE: Multidetector CT imaging of the head and cervical spine was performed following the standard protocol without intravenous contrast. Multiplanar CT image reconstructions of the cervical spine were also generated. COMPARISON:  CT HEAD Jun 22, 2016 FINDINGS: CT HEAD FINDINGS BRAIN: No intraparenchymal hemorrhage, mass effect nor midline  shift. The ventricles and sulci are normal. No acute large vascular territory infarcts. No abnormal extra-axial fluid collections. Basal cisterns are patent. VASCULAR: Mild calcific atherosclerosis carotid siphon. SKULL/SOFT TISSUES: No skull fracture. No significant soft tissue swelling. ORBITS/SINUSES: The included ocular globes and orbital contents are nonacute, RIGHT drusen.Mild paranasal sinus mucosal thickening. Mastoid air cells are well aerated. OTHER: None. CT CERVICAL SPINE FINDINGS ALIGNMENT: Maintained lordosis. Vertebral bodies in alignment. SKULL BASE AND VERTEBRAE: Cervical vertebral bodies and posterior elements are intact. Mild C5-6 disc height loss and uncovertebral hypertrophy. No destructive bony lesions. C1-2 articulation maintained. SOFT TISSUES AND SPINAL CANAL: Nonacute. Trace calcific atherosclerosis carotid bifurcations. DISC LEVELS: Mild canal stenosis C5-6. Mild LEFT C5-6 neural foraminal narrowing. UPPER CHEST: Lung apices are clear. OTHER: None. IMPRESSION: CT HEAD: 1. No acute intracranial process. 2. Mild  atherosclerosis, otherwise negative noncontrast CT HEAD for age. CT CERVICAL SPINE: 1. No acute fracture or malalignment. Electronically Signed   By: Awilda Metro M.D.   On: 08/19/2016 20:33   Ct Cervical Spine Wo Contrast  Result Date: 08/19/2016 CLINICAL DATA:  Larey Seat off porch during seizure. Altered mental status and headache. History of alcohol abuse. EXAM: CT HEAD WITHOUT CONTRAST CT CERVICAL SPINE WITHOUT CONTRAST TECHNIQUE: Multidetector CT imaging of the head and cervical spine was performed following the standard protocol without intravenous contrast. Multiplanar CT image reconstructions of the cervical spine were also generated. COMPARISON:  CT HEAD Jun 22, 2016 FINDINGS: CT HEAD FINDINGS BRAIN: No intraparenchymal hemorrhage, mass effect nor midline shift. The ventricles and sulci are normal. No acute large vascular territory infarcts. No abnormal extra-axial fluid collections. Basal cisterns are patent. VASCULAR: Mild calcific atherosclerosis carotid siphon. SKULL/SOFT TISSUES: No skull fracture. No significant soft tissue swelling. ORBITS/SINUSES: The included ocular globes and orbital contents are nonacute, RIGHT drusen.Mild paranasal sinus mucosal thickening. Mastoid air cells are well aerated. OTHER: None. CT CERVICAL SPINE FINDINGS ALIGNMENT: Maintained lordosis. Vertebral bodies in alignment. SKULL BASE AND VERTEBRAE: Cervical vertebral bodies and posterior elements are intact. Mild C5-6 disc height loss and uncovertebral hypertrophy. No destructive bony lesions. C1-2 articulation maintained. SOFT TISSUES AND SPINAL CANAL: Nonacute. Trace calcific atherosclerosis carotid bifurcations. DISC LEVELS: Mild canal stenosis C5-6. Mild LEFT C5-6 neural foraminal narrowing. UPPER CHEST: Lung apices are clear. OTHER: None. IMPRESSION: CT HEAD: 1. No acute intracranial process. 2. Mild atherosclerosis, otherwise negative noncontrast CT HEAD for age. CT CERVICAL SPINE: 1. No acute fracture or  malalignment. Electronically Signed   By: Awilda Metro M.D.   On: 08/19/2016 20:33    Procedures Procedures (including critical care time)  Medications Ordered in ED Medications  sodium chloride 0.9 % bolus 1,000 mL (1,000 mLs Intravenous Not Given 08/19/16 1900)  LORazepam (ATIVAN) 2 MG/ML injection (2 mg  Given 08/19/16 1943)  valproate (DEPACON) 1,000 mg in dextrose 5 % 50 mL IVPB (0 mg Intravenous Stopped 08/19/16 2141)     Initial Impression / Assessment and Plan / ED Course  I have reviewed the triage vital signs and the nursing notes.  Pertinent labs & imaging results that were available during my care of the patient were reviewed by me and considered in my medical decision making (see chart for details).     Presenting with seizures prior to arrival. Records Reviewed. He had a recent hospitalization in May for seizures. Had a code event in the setting of receiving Haldol with prolonged QTC. Was tried on multiple different seizure medications, and ultimately discharged Depakote. He states that he never got his  prescription for Depakote, and his PCP wrote him for Topamax. Later on, his sister states that he was waiting for his Medicaid coverage to kick in, and could be that he could not afford this medication. States that he now has Medicaid coverage as of the first of this month.  Patient very intoxicated throughout initial ED course and becomes very agitated, climbing out of bed despite seizure precautions. He states that he would like to be discharged home. Given his significant intoxication, I do not feel that he has medical decision-making capacity. We are in process of ruling out serious head injury as well as seizure evaluation. Attempted to involve sister in decision making, and we have agreed to IVC him due to patient threatening me and becoming combative. I feel it was necessary to rule out serious medical problems/injury while he he not have decision making capacity.   He has  no focal neurological deficits. Exam otherwise unremarkable.  Ct head and cervical spine normal. Blood work reassuring, but with not detectable depakote level. Will load 1 g of depakote. Now that he could afford this medication, will prescribe course of depakote and have him follow-up with Dr. Juanetta GoslingHawkins. CM consult placed to review on Monday in case there continues to be problems affording medications, perhaps we can assist him.  He is still mildly intoxicated. No further seizures. His sister has agreed to assist him home and observe him at home as patient still would like to be discharged. IVC rescinded and patient discharged in custody of his sister.   Final Clinical Impressions(s) / ED Diagnoses   Final diagnoses:  Alcoholic intoxication without complication (HCC)  Seizure (HCC)    New Prescriptions Discharge Medication List as of 08/19/2016  9:35 PM    START taking these medications   Details  !! divalproex (DEPAKOTE) 500 MG DR tablet Take 1 tablet (500 mg total) by mouth 3 (three) times daily., Starting Sun 08/19/2016, Print     !! - Potential duplicate medications found. Please discuss with provider.       Lavera GuiseLiu, Ryliegh Mcduffey Duo, MD 08/19/16 2150

## 2016-08-19 NOTE — Discharge Instructions (Signed)
Please stop drinking alcohol  Take seizure medications as prescribed. You are suppose to be taking depakote  Follow-up closely with Dr. Juanetta GoslingHawkins.  Return for worsening symptoms, including recurrent seizures, persistent confusion or any other symptoms concerning to you

## 2016-08-19 NOTE — ED Triage Notes (Signed)
Pt fell on porch during seizure and c/o pain to right upper back.

## 2016-08-19 NOTE — ED Triage Notes (Signed)
Did not take seizure medicine today. Seizure activity with EMS.  CBG 89.  EMS gave 2.5 mg versed.  Pt seizing upon arrival to ED.  PT states he has been drinking today

## 2016-12-05 ENCOUNTER — Emergency Department (HOSPITAL_COMMUNITY)
Admission: EM | Admit: 2016-12-05 | Discharge: 2016-12-05 | Discharge status: Against Medical Advice | Payer: Medicaid Other | Attending: Emergency Medicine | Admitting: Emergency Medicine

## 2016-12-05 ENCOUNTER — Encounter (HOSPITAL_COMMUNITY): Payer: Self-pay | Admitting: Emergency Medicine

## 2016-12-05 ENCOUNTER — Emergency Department (HOSPITAL_COMMUNITY): Payer: Medicaid Other

## 2016-12-05 DIAGNOSIS — J45909 Unspecified asthma, uncomplicated: Secondary | ICD-10-CM | POA: Diagnosis not present

## 2016-12-05 DIAGNOSIS — R0789 Other chest pain: Secondary | ICD-10-CM | POA: Diagnosis not present

## 2016-12-05 DIAGNOSIS — Z79899 Other long term (current) drug therapy: Secondary | ICD-10-CM | POA: Diagnosis not present

## 2016-12-05 DIAGNOSIS — F17210 Nicotine dependence, cigarettes, uncomplicated: Secondary | ICD-10-CM | POA: Insufficient documentation

## 2016-12-05 DIAGNOSIS — J449 Chronic obstructive pulmonary disease, unspecified: Secondary | ICD-10-CM | POA: Insufficient documentation

## 2016-12-05 DIAGNOSIS — R059 Cough, unspecified: Secondary | ICD-10-CM

## 2016-12-05 DIAGNOSIS — R05 Cough: Secondary | ICD-10-CM | POA: Insufficient documentation

## 2016-12-05 LAB — BASIC METABOLIC PANEL
Anion gap: 8 (ref 5–15)
BUN: 10 mg/dL (ref 6–20)
CALCIUM: 9.3 mg/dL (ref 8.9–10.3)
CO2: 27 mmol/L (ref 22–32)
CREATININE: 0.99 mg/dL (ref 0.61–1.24)
Chloride: 103 mmol/L (ref 101–111)
GFR calc Af Amer: >60 mL/min (ref >60)
GLUCOSE: 105 mg/dL — AB (ref 65–99)
Potassium: 4.1 mmol/L (ref 3.5–5.1)
Sodium: 138 mmol/L (ref 135–145)

## 2016-12-05 LAB — CBC WITH DIFFERENTIAL/PLATELET
Basophils Absolute: 0 10*3/uL (ref 0.0–0.1)
Basophils Relative: 0 %
EOS ABS: 0.1 10*3/uL (ref 0.0–0.7)
EOS PCT: 1 %
HCT: 46.2 % (ref 39.0–52.0)
Hemoglobin: 15.9 g/dL (ref 13.0–17.0)
LYMPHS ABS: 2 10*3/uL (ref 0.7–4.0)
LYMPHS PCT: 36 %
MCH: 32.5 pg (ref 26.0–34.0)
MCHC: 34.4 g/dL (ref 30.0–36.0)
MCV: 94.5 fL (ref 78.0–100.0)
MONO ABS: 0.4 10*3/uL (ref 0.1–1.0)
MONOS PCT: 8 %
Neutro Abs: 3.1 10*3/uL (ref 1.7–7.7)
Neutrophils Relative %: 55 %
PLATELETS: 217 10*3/uL (ref 150–400)
RBC: 4.89 MIL/uL (ref 4.22–5.81)
RDW: 13.8 % (ref 11.5–15.5)
WBC: 5.7 10*3/uL (ref 4.0–10.5)

## 2016-12-05 LAB — TROPONIN I: Troponin I: <0.03 ng/mL (ref <0.03)

## 2016-12-05 LAB — LACTIC ACID, PLASMA: Lactic Acid, Venous: 1.2 mmol/L (ref 0.5–1.9)

## 2016-12-05 MED ORDER — IPRATROPIUM-ALBUTEROL 0.5-2.5 (3) MG/3ML IN SOLN
3.0000 mL | Freq: Once | RESPIRATORY_TRACT | Status: AC
  Administered 2016-12-05: 3 mL via RESPIRATORY_TRACT
  Filled 2016-12-05: qty 3

## 2016-12-05 MED ORDER — ALBUTEROL SULFATE (2.5 MG/3ML) 0.083% IN NEBU
2.5000 mg | INHALATION_SOLUTION | Freq: Once | RESPIRATORY_TRACT | Status: AC
  Administered 2016-12-05: 2.5 mg via RESPIRATORY_TRACT
  Filled 2016-12-05: qty 3

## 2016-12-05 NOTE — ED Notes (Signed)
Pt was cursing and saying, "I'm ready to leave this fucking place. I've been calling out for the last GD 20 mins. Take this damn IV out of my arm. I've got to take a shit. I'm bipolar and I've been lying here for 2 hours and I haven't gotten nothing done."

## 2016-12-05 NOTE — ED Triage Notes (Signed)
Per EMS pt has had cough for the past three days, sputum is white and thick.  When pt gets into a coughing fit, he becomes dyspneic.  Pt has been sleeping in the woods in a sleeping bag.  Pt states that the cough became worse this morning.  Pt has taken a percocet this morning.  Pt states that his cough is causing a burning in his chest and throat.  Temp oral 97.8 CBG 114

## 2016-12-05 NOTE — ED Notes (Signed)
Patient angry because he states "he's been sitting too long in this room and needs pain medicine." Eboni RN d/c IV per pt request. Vital obtained and patient signing AMA.

## 2016-12-05 NOTE — ED Provider Notes (Signed)
[facility] EMERGENCY DEPARTMENT Provider Note   CSN: [id number] Arrival date & time: 12/05/16  1112     History   Chief Complaint Chief Complaint  Patient presents with  . Cough    HPI [name] is a 51 y.o. male.  HPI Pt was seen at 1205. Per pt, c/o gradual onset and persistence of constant cough for the past 3 days. Has been associated with SOB and constant mid-sternal chest "pain." Pt describes the CP as "burning" which worsens when he coughs. Pt states he took percocet PTA.  Denies N/V/D, no back pain, no abd pain, no fevers, no palpitations, no rash.   Past Medical History:  Diagnosis Date  . Arthritis   . Asthma   . Chronic neck pain   . COPD (chronic obstructive pulmonary disease) (HCC)   . Injury of jawline   . Mental disorder   . Ruptured disc, cervical   . Seizures (HCC)   . Torn rotator cuff     Patient Active Problem List   Diagnosis Date Noted  . Protein-calorie malnutrition, severe 06/20/2016  . Aspiration pneumonia (HCC) 06/15/2016  . Metabolic encephalopathy 06/15/2016  . Alcohol abuse with intoxication (HCC) 06/12/2016  . COPD (chronic obstructive pulmonary disease) (HCC) 06/12/2016  . History of traumatic brain injury 06/12/2016  . Seizures (HCC) 06/12/2016  . Endotracheally intubated   . Respiratory failure (HCC)     History reviewed. No pertinent surgical history.     Home Medications    Prior to Admission medications   Medication Sig Start Date End Date Taking? Authorizing Provider  albuterol (PROVENTIL HFA;VENTOLIN HFA) 108 (90 BASE) MCG/ACT inhaler Inhale 2 puffs into the lungs every 6 (six) hours as needed. For shortness of breath    [provider]  ALPRAZolam (XANAX) 1 MG tablet Take 1 mg by mouth 3 (three) times daily as needed for anxiety.    [provider]  divalproex (DEPAKOTE) 500 MG DR tablet Take 1 tablet (500 mg total) by mouth every 8 (eight) hours. Patient not taking: Reported on 08/19/2016  07/01/16   [name], MD  divalproex (DEPAKOTE) 500 MG DR tablet Take 1 tablet (500 mg total) by mouth 3 (three) times daily. 08/19/16   [name], MD  feeding supplement (BOOST / RESOURCE BREEZE) LIQD Take 1 Container by mouth 3 (three) times daily between meals. 07/01/16   [name], MD  folic acid (FOLVITE) 1 MG tablet Take 1 tablet (1 mg total) by mouth daily. 07/02/16   [name], MD  HYDROcodone-acetaminophen (NORCO/VICODIN) 5-325 MG tablet Take 1 tablet by mouth every 4 (four) hours as needed. Patient not taking: Reported on 08/19/2016 07/08/16   [name], PA-C  ibuprofen (ADVIL,MOTRIN) 600 MG tablet Take 1 tablet (600 mg total) by mouth 4 (four) times daily. Patient not taking: Reported on 08/19/2016 07/08/16   [name], PA-C  metoprolol tartrate (LOPRESSOR) 25 MG tablet Take 0.5 tablets (12.5 mg total) by mouth 2 (two) times daily. Patient not taking: Reported on 08/19/2016 07/01/16   [name], MD  Multiple Vitamin (MULTIVITAMIN WITH MINERALS) TABS tablet Take 1 tablet by mouth daily. 07/01/16   [name], MD  oxyCODONE-acetaminophen (PERCOCET) 10-325 MG tablet Take 1 tablet by mouth every 6 (six) hours as needed for pain.    [provider]  PARoxetine (PAXIL) 20 MG tablet Take 1 tablet (20 mg total) by mouth daily. Patient not taking: Reported on 08/19/2016 07/02/16   [name],  Howell Pringlealph A, MD  thiamine 100 MG tablet Take 1 tablet (100 mg total) by mouth daily. 07/02/16   Narda BondsNettey, Ralph A, MD  topiramate (TOPAMAX) 50 MG tablet Take 50 mg by mouth at bedtime.    [provider]    Family History Family History  Problem Relation Age of Onset  . Heart failure Father   . Cancer Father   . Alcoholism Brother     Social History Social History  Substance Use Topics  . Smoking status: Current Some Day Smoker    Packs/day: 0.50    Types: Cigarettes, Cigars  . Smokeless tobacco: Never Used  . Alcohol use 7.2 oz/week    12 Cans of beer per week      Comment: montly     Allergies   Patient has no known allergies.   Review of Systems Review of Systems ROS: Statement: All systems negative except as marked or noted in the HPI; Constitutional: Negative for fever and chills. ; ; Eyes: Negative for eye pain, redness and discharge. ; ; ENMT: Negative for ear pain, hoarseness, nasal congestion, sinus pressure and sore throat. ; ; Cardiovascular: Negative for palpitations, diaphoresis, and peripheral edema. ; ; Respiratory: +CP, SOB, cough. Negative for wheezing and stridor. ; ; Gastrointestinal: Negative for nausea, vomiting, diarrhea, abdominal pain, blood in stool, hematemesis, jaundice and rectal bleeding. . ; ; Genitourinary: Negative for dysuria, flank pain and hematuria. ; ; Musculoskeletal: Negative for back pain and neck pain. Negative for swelling and trauma.; ; Skin: Negative for pruritus, rash, abrasions, blisters, bruising and skin lesion.; ; Neuro: Negative for headache, lightheadedness and neck stiffness. Negative for weakness, altered level of consciousness, altered mental status, extremity weakness, paresthesias, involuntary movement, seizure and syncope.       Physical Exam Updated Vital Signs BP 110/76   Pulse 72   Temp 98.2 F (36.8 C) (Oral)   Resp 12   Ht 6\' 1"  (1.854 m)   Wt 102.1 kg (225 lb)   SpO2 98%   BMI 29.69 kg/m   Physical Exam 1210: Physical examination:  Nursing notes reviewed; Vital signs and O2 SAT reviewed;  Constitutional: Well developed, Well nourished, Well hydrated, In no acute distress; Head:  Normocephalic, atraumatic; Eyes: EOMI, PERRL, No scleral icterus; ENMT: Mouth and pharynx normal, Mucous membranes moist; Neck: Supple, Full range of motion, No lymphadenopathy; Cardiovascular: Regular rate and rhythm, No gallop; Respiratory: Breath sounds coarse & equal bilaterally, scattered wheezes. No audible wheezing.  Speaking full sentences with ease, Normal respiratory effort/excursion; Chest:  Nontender, Movement normal; Abdomen: Soft, Nontender, Nondistended, Normal bowel sounds; Genitourinary: No CVA tenderness; Extremities: Pulses normal, No tenderness, No edema, No calf edema or asymmetry.; Neuro: AA&Ox3, Major CN grossly intact.  Speech clear. No gross focal motor or sensory deficits in extremities.; Skin: Color normal, Warm, Dry.   ED Treatments / Results  Labs (all labs ordered are listed, but only abnormal results are displayed)   EKG  EKG Interpretation  Date/Time:  Wednesday December 05 2016 11:18:42 EDT Ventricular Rate:  63 PR Interval:    QRS Duration: 100 QT Interval:  411 QTC Calculation: 421 R Axis:   82 Text Interpretation:  Sinus rhythm Anteroseptal infarct, old Baseline wander When compared with ECG of 08/19/2016 Rate slower Confirmed by Samuel JesterMcManus, Elih Mooney [phone]([id number]) on 12/05/2016 12:21:51 PM       Radiology   Procedures Procedures (including critical care time)  Medications Ordered in ED Medications  ipratropium-albuterol (DUONEB) 0.5-2.5 (3) MG/3ML nebulizer solution 3 mL (  3 mLs Nebulization Given 12/05/16 1222)  albuterol (PROVENTIL) (2.5 MG/3ML) 0.083% nebulizer solution 2.5 mg (2.5 mg Nebulization Given 12/05/16 1222)     Initial Impression / Assessment and Plan / ED Course  I have reviewed the triage vital signs and the nursing notes.  Pertinent labs & imaging results that were available during my care of the patient were reviewed by me and considered in my medical decision making (see chart for details).  MDM Reviewed: previous chart, nursing note and vitals Reviewed previous: labs and ECG Interpretation: labs, ECG and x-ray   Results for orders placed or performed during the hospital encounter of 12/05/16  CBC with Differential  Result Value Ref Range   WBC 5.7 4.0 - 10.5 K/uL   RBC 4.89 4.22 - 5.81 MIL/uL   Hemoglobin 15.9 13.0 - 17.0 g/dL   HCT 47.8 29.5 - 62.1 %   MCV 94.5 78.0 - 100.0 fL   MCH 32.5 26.0 - 34.0 pg   MCHC 34.4  30.0 - 36.0 g/dL   RDW 30.8 65.7 - 84.6 %   Platelets 217 150 - 400 K/uL   Neutrophils Relative % 55 %   Neutro Abs 3.1 1.7 - 7.7 K/uL   Lymphocytes Relative 36 %   Lymphs Abs 2.0 0.7 - 4.0 K/uL   Monocytes Relative 8 %   Monocytes Absolute 0.4 0.1 - 1.0 K/uL   Eosinophils Relative 1 %   Eosinophils Absolute 0.1 0.0 - 0.7 K/uL   Basophils Relative 0 %   Basophils Absolute 0.0 0.0 - 0.1 K/uL    1310:  Pt cursing and yelling, stating he's "bipolar" and "no-one is doing anything" and he "needs pain medicine." Pt reminded of the workup he has had completed/currently in process and that he took percocet PTA. Pt demanding his IV be taken out and is "leaving this fucking place."   Pt encouraged to stay for completion of his workup, pt refuses.  Pt makes his own medical decisions.  Risks of AMA explained to pt, including, but not limited to:  stroke, heart attack, cardiac arrythmia ("irregular heart rate/beat"), "passing out," temporary and/or permanent disability, death.  Pt verb understanding and continues to refuse to stay for completion of his testing, understanding the consequences of his decision.  I encouraged pt to follow up with his PMD tomorrow and return to the ED immediately if symptoms worsen, return, or for any other concerns.  Pt verb understanding, and left the ED.     Final Clinical Impressions(s) / ED Diagnoses   Final diagnoses:  None    New Prescriptions New Prescriptions   No medications on file     Samuel Jester, DO 12/08/16 [id number]

## 2017-07-01 ENCOUNTER — Emergency Department (HOSPITAL_COMMUNITY)
Admission: EM | Admit: 2017-07-01 | Discharge: 2017-07-01 | Disposition: A | Discharge status: Other Acute Inpatient Hospital | Payer: Medicaid Other

## 2017-07-01 ENCOUNTER — Ambulatory Visit (HOSPITAL_COMMUNITY)
Admission: RE | Admit: 2017-07-01 | Discharge: 2017-07-01 | Disposition: A | Discharge status: Home / Self Care | Payer: Medicaid Other | Source: Ambulatory Visit | Attending: Pulmonary Disease | Admitting: Pulmonary Disease

## 2017-07-01 ENCOUNTER — Other Ambulatory Visit (HOSPITAL_COMMUNITY): Payer: Self-pay | Admitting: Pulmonary Disease

## 2017-07-01 DIAGNOSIS — M25512 Pain in left shoulder: Secondary | ICD-10-CM | POA: Insufficient documentation

## 2017-07-01 DIAGNOSIS — M545 Low back pain: Secondary | ICD-10-CM

## 2017-07-01 DIAGNOSIS — X58XXXA Exposure to other specified factors, initial encounter: Secondary | ICD-10-CM | POA: Insufficient documentation

## 2017-07-01 DIAGNOSIS — S32000A Wedge compression fracture of unspecified lumbar vertebra, initial encounter for closed fracture: Secondary | ICD-10-CM | POA: Insufficient documentation

## 2017-07-01 NOTE — ED Notes (Signed)
Patient informed Clinical research associate during triage he was here to have outpatient xrays done. Patient directed to radiology waiting room.
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
## 2017-08-11 IMAGING — CR DG CHEST 1V PORT SAME DAY
1 series · 1 of 1 positions shown · non-contrast
Comparison: Portable chest x-ray of today's date

CLINICAL DATA: Endotracheal tube change out due to cuff leak.

EXAM:
PORTABLE CHEST 1 VIEW

[ap portable]
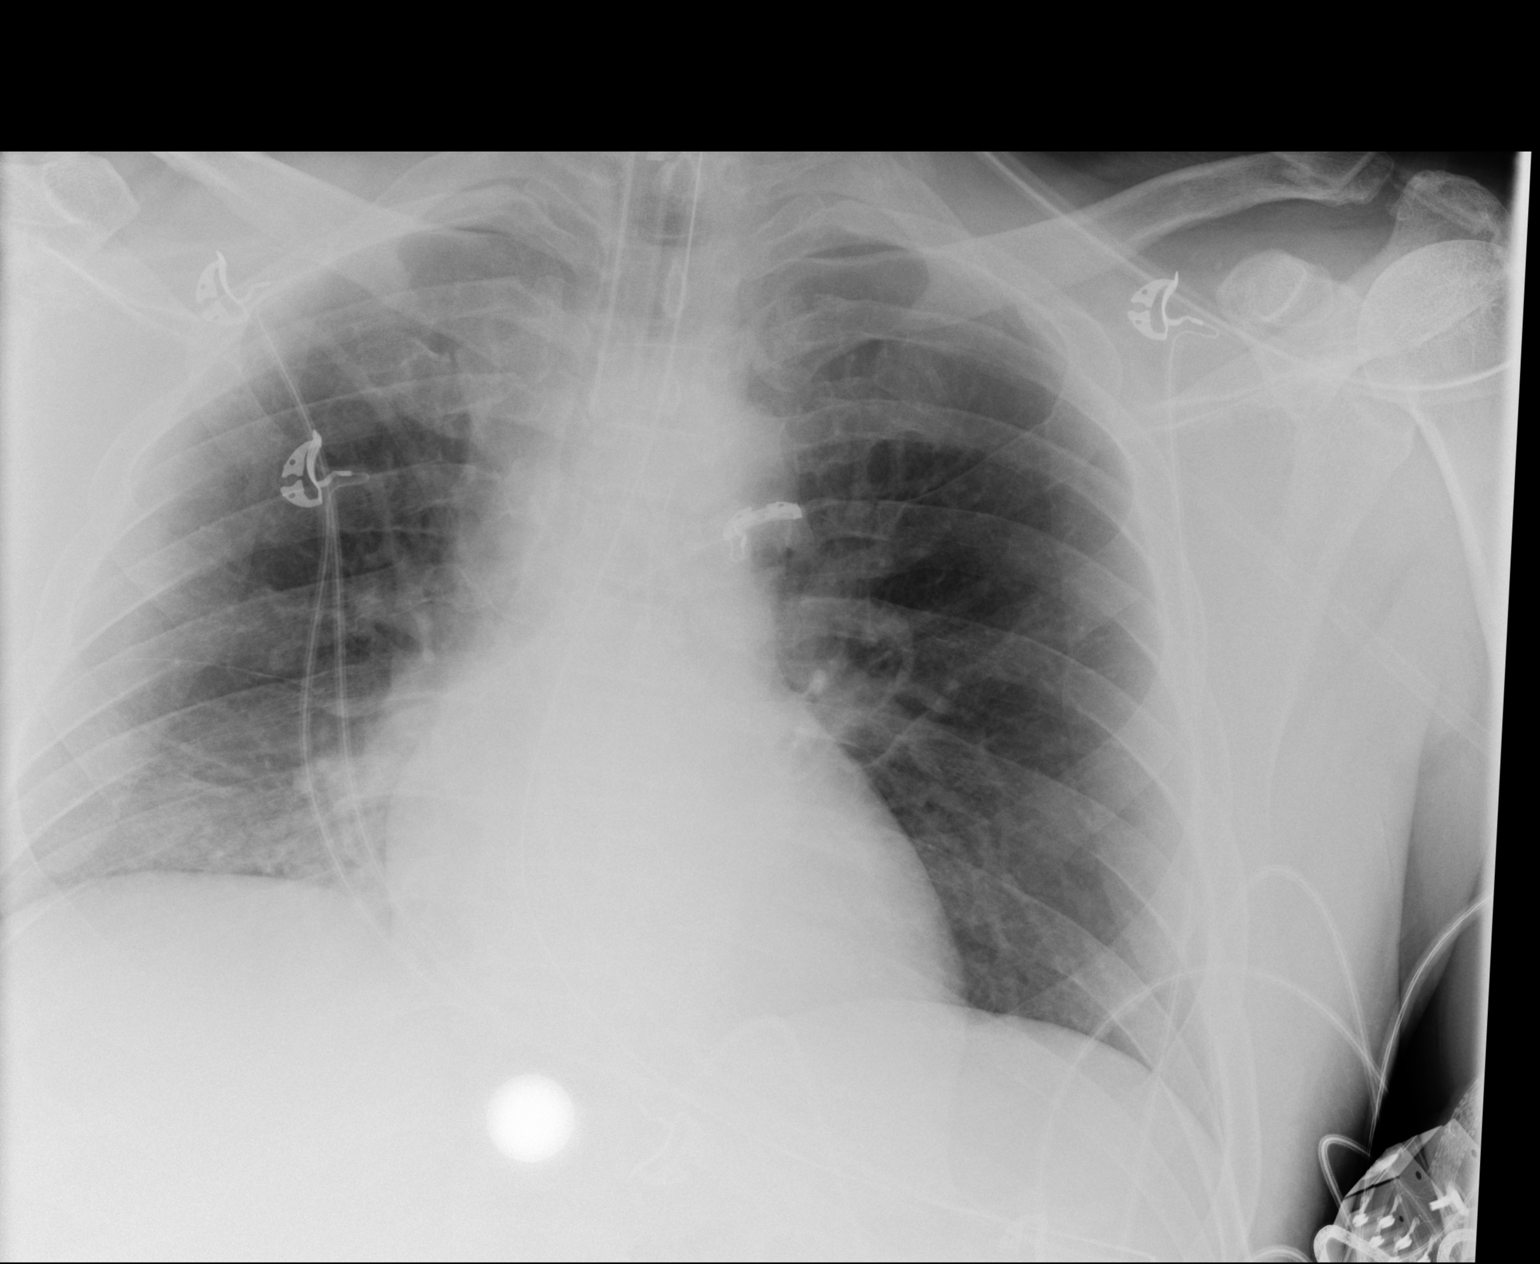

[1 of 1 positions shown; findings below may reference images not displayed]

FINDINGS: The endotracheal tube tip lies just below the inferior margin of the
clavicular heads approximately 4.4 cm above the carina. The
esophagogastric tube tip in proximal port lie in the gastric cardia.
The cardiac silhouette is normal in size. The pulmonary vascularity
is not engorged. The lungs remain mildly hypoinflated. The left lung
base is clear dirt on this study than earlier today. There is
persistent density at the right lung base.
IMPRESSION: Interval replacement of the endotracheal tube with adequate
radiographic positioning. Advancement of the esophagogastric tube
has occurred such that the proximal port and tip are in the cardia.

## 2017-08-12 IMAGING — CR DG CHEST 1V PORT
1 series · 1 of 1 positions shown · non-contrast
Comparison: 06/12/2016

CLINICAL DATA: History of COPD and asthma

EXAM:
PORTABLE CHEST 1 VIEW

[ap portable]
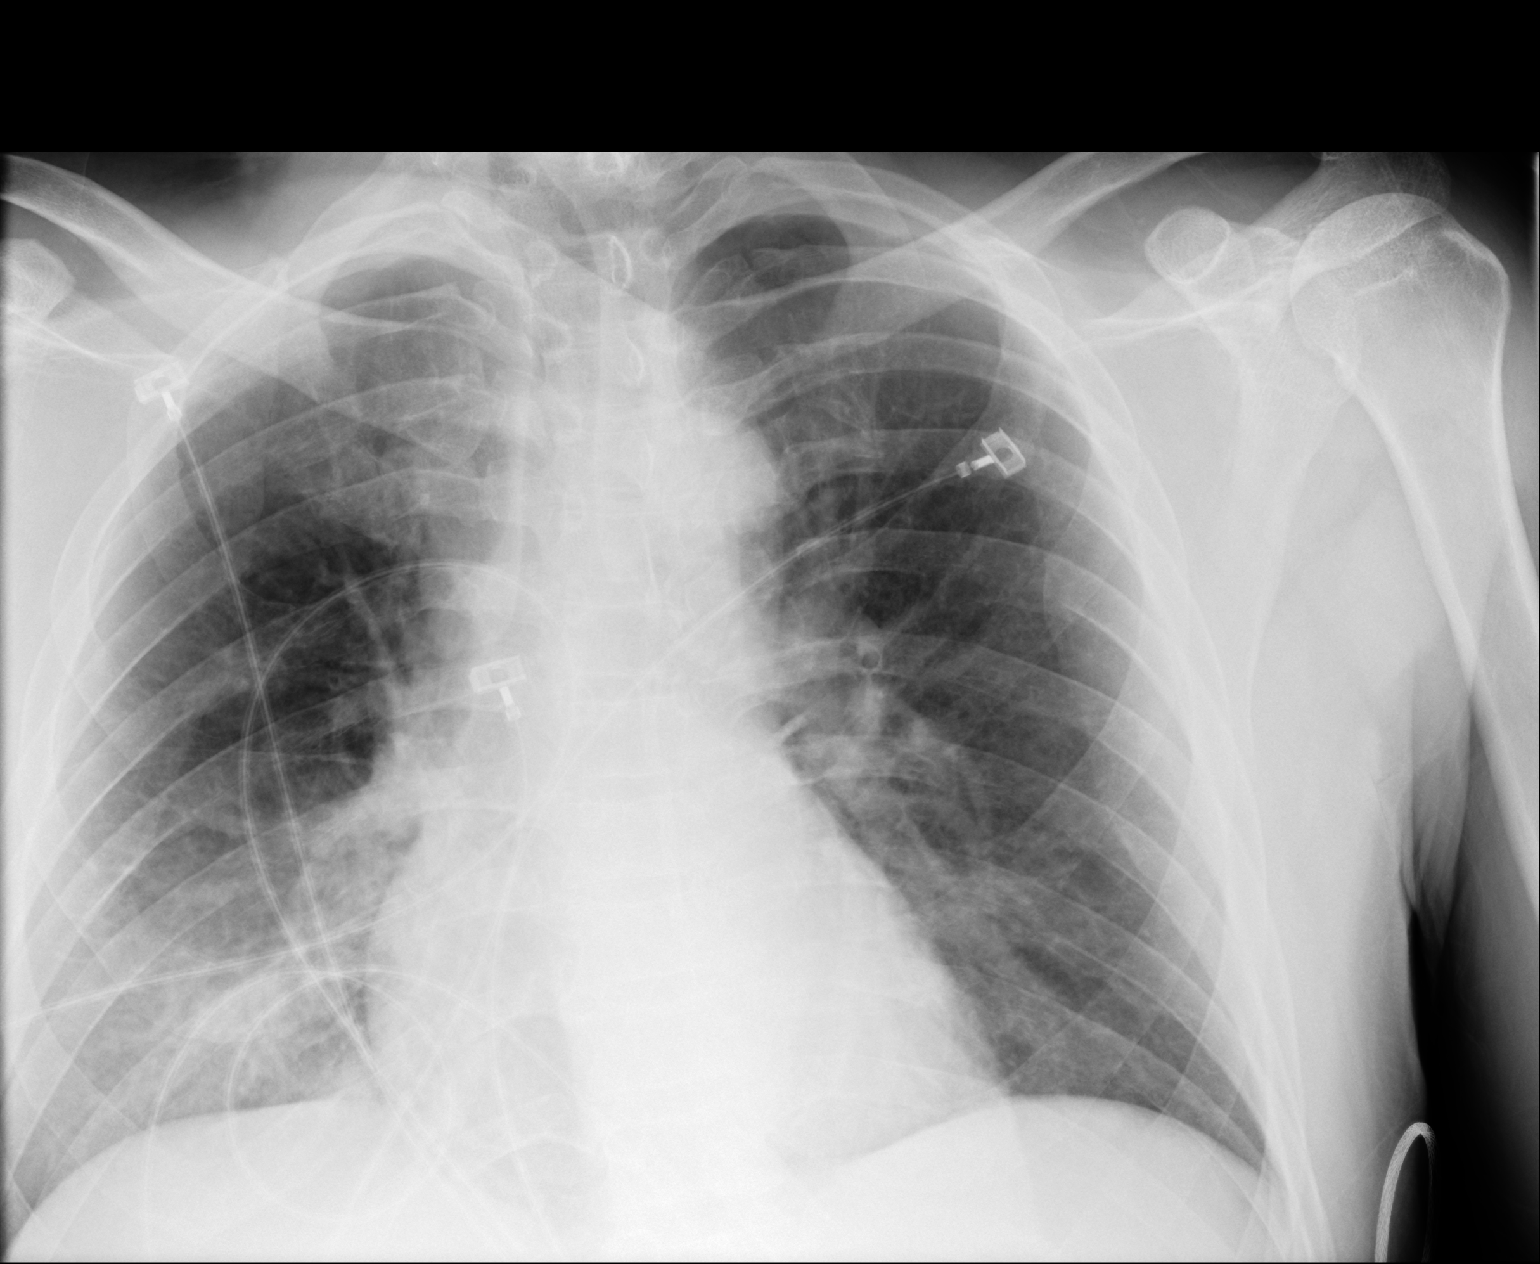

[1 of 1 positions shown; findings below may reference images not displayed]

FINDINGS: Non inclusion of the right CP angle. Removal of endotracheal and
esophageal tubes. The left lung is grossly clear. Hazy atelectasis
or infiltrate at the right base. Stable cardiomediastinal
silhouette.
IMPRESSION: Removal of endotracheal and esophageal tubes. Slight increased hazy
atelectasis or infiltrate at the right base

## 2017-10-10 ENCOUNTER — Encounter: Payer: Self-pay | Admitting: Orthopaedic Surgery

## 2018-01-02 ENCOUNTER — Other Ambulatory Visit: Payer: Self-pay

## 2018-01-02 ENCOUNTER — Emergency Department (HOSPITAL_COMMUNITY)
Admission: EM | Admit: 2018-01-02 | Discharge: 2018-01-02 | Discharge status: Against Medical Advice | Payer: Medicaid Other | Attending: Emergency Medicine | Admitting: Emergency Medicine

## 2018-01-02 ENCOUNTER — Emergency Department (HOSPITAL_COMMUNITY): Payer: Medicaid Other

## 2018-01-02 ENCOUNTER — Encounter (HOSPITAL_COMMUNITY): Payer: Self-pay | Admitting: Emergency Medicine

## 2018-01-02 DIAGNOSIS — S20212A Contusion of left front wall of thorax, initial encounter: Secondary | ICD-10-CM | POA: Diagnosis not present

## 2018-01-02 DIAGNOSIS — S3991XA Unspecified injury of abdomen, initial encounter: Secondary | ICD-10-CM | POA: Insufficient documentation

## 2018-01-02 DIAGNOSIS — Z79899 Other long term (current) drug therapy: Secondary | ICD-10-CM | POA: Diagnosis not present

## 2018-01-02 DIAGNOSIS — M542 Cervicalgia: Secondary | ICD-10-CM | POA: Diagnosis not present

## 2018-01-02 DIAGNOSIS — S299XXA Unspecified injury of thorax, initial encounter: Secondary | ICD-10-CM | POA: Diagnosis present

## 2018-01-02 DIAGNOSIS — F17210 Nicotine dependence, cigarettes, uncomplicated: Secondary | ICD-10-CM | POA: Insufficient documentation

## 2018-01-02 DIAGNOSIS — J449 Chronic obstructive pulmonary disease, unspecified: Secondary | ICD-10-CM | POA: Insufficient documentation

## 2018-01-02 DIAGNOSIS — R10812 Left upper quadrant abdominal tenderness: Secondary | ICD-10-CM | POA: Diagnosis not present

## 2018-01-02 DIAGNOSIS — Y999 Unspecified external cause status: Secondary | ICD-10-CM | POA: Insufficient documentation

## 2018-01-02 DIAGNOSIS — Y929 Unspecified place or not applicable: Secondary | ICD-10-CM | POA: Insufficient documentation

## 2018-01-02 DIAGNOSIS — Y939 Activity, unspecified: Secondary | ICD-10-CM | POA: Diagnosis not present

## 2018-01-02 DIAGNOSIS — Z23 Encounter for immunization: Secondary | ICD-10-CM | POA: Insufficient documentation

## 2018-01-02 LAB — HEPATIC FUNCTION PANEL
ALK PHOS: 71 U/L (ref 38–126)
ALT: 18 U/L (ref 0–44)
AST: 19 U/L (ref 15–41)
Albumin: 4.1 g/dL (ref 3.5–5.0)
BILIRUBIN TOTAL: 0.4 mg/dL (ref 0.3–1.2)
Total Protein: 7.3 g/dL (ref 6.5–8.1)

## 2018-01-02 LAB — BASIC METABOLIC PANEL
Anion gap: 7 (ref 5–15)
BUN: 7 mg/dL (ref 6–20)
CHLORIDE: 111 mmol/L (ref 98–111)
CO2: 24 mmol/L (ref 22–32)
Calcium: 9.1 mg/dL (ref 8.9–10.3)
Creatinine, Ser: 0.86 mg/dL (ref 0.61–1.24)
GFR calc Af Amer: >60 mL/min (ref >60)
GFR calc non Af Amer: >60 mL/min (ref >60)
Glucose, Bld: 119 mg/dL — ABNORMAL HIGH (ref 70–99)
Potassium: 3.6 mmol/L (ref 3.5–5.1)
SODIUM: 142 mmol/L (ref 135–145)

## 2018-01-02 LAB — LIPASE, BLOOD: Lipase: 42 U/L (ref 11–51)

## 2018-01-02 LAB — CBC
HEMATOCRIT: 45.5 % (ref 39.0–52.0)
HEMOGLOBIN: 15.4 g/dL (ref 13.0–17.0)
MCH: 32.4 pg (ref 26.0–34.0)
MCHC: 33.8 g/dL (ref 30.0–36.0)
MCV: 95.8 fL (ref 80.0–100.0)
Platelets: 277 10*3/uL (ref 150–400)
RBC: 4.75 MIL/uL (ref 4.22–5.81)
RDW: 12.5 % (ref 11.5–15.5)
WBC: 8.5 10*3/uL (ref 4.0–10.5)
nRBC: 0 % (ref 0.0–0.2)

## 2018-01-02 LAB — ETHANOL: Alcohol, Ethyl (B): 146 mg/dL — ABNORMAL HIGH

## 2018-01-02 MED ORDER — FENTANYL CITRATE (PF) 100 MCG/2ML IJ SOLN
50.0000 ug | Freq: Once | INTRAMUSCULAR | Status: AC
  Administered 2018-01-02: 50 ug via INTRAVENOUS
  Filled 2018-01-02: qty 2

## 2018-01-02 MED ORDER — SODIUM CHLORIDE 0.9 % IV SOLN: INTRAVENOUS | Status: DC

## 2018-01-02 MED ORDER — ONDANSETRON HCL 4 MG/2ML IJ SOLN
4.0000 mg | Freq: Once | INTRAMUSCULAR | Status: AC
  Administered 2018-01-02: 4 mg via INTRAVENOUS
  Filled 2018-01-02: qty 2

## 2018-01-02 MED ORDER — TETANUS-DIPHTH-ACELL PERTUSSIS 5-2.5-18.5 LF-MCG/0.5 IM SUSP
0.5000 mL | Freq: Once | INTRAMUSCULAR | Status: AC
  Administered 2018-01-02: 0.5 mL via INTRAMUSCULAR
  Filled 2018-01-02: qty 0.5

## 2018-01-02 NOTE — ED Notes (Signed)
Pt walked out to the waiting room stating he was leaving, but found that his parents were waiting for him in the waiting room and they encouraged him to return to his treatment room.  Pt returns now, apologizing for his actions.
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
## 2018-01-02 NOTE — ED Notes (Signed)
Patient jerked out his IV and stated he was tired of waiting on his results. Patient advised by RN that he was waiting on CT to come get him. Patient jerked out his IV intact and left AMA. Dr. Deretha EmoryZackowski was notified of the incident.
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
## 2018-02-07 ENCOUNTER — Encounter (HOSPITAL_COMMUNITY): Payer: Self-pay

## 2018-02-07 ENCOUNTER — Emergency Department (HOSPITAL_COMMUNITY)
Admission: EM | Admit: 2018-02-07 | Discharge: 2018-02-07 | Disposition: A | Discharge status: Home / Self Care | Payer: Medicaid Other | Attending: Emergency Medicine | Admitting: Emergency Medicine

## 2018-02-07 ENCOUNTER — Emergency Department (HOSPITAL_COMMUNITY): Payer: Medicaid Other

## 2018-02-07 ENCOUNTER — Other Ambulatory Visit: Payer: Self-pay

## 2018-02-07 DIAGNOSIS — F17210 Nicotine dependence, cigarettes, uncomplicated: Secondary | ICD-10-CM | POA: Insufficient documentation

## 2018-02-07 DIAGNOSIS — Z79899 Other long term (current) drug therapy: Secondary | ICD-10-CM | POA: Diagnosis not present

## 2018-02-07 DIAGNOSIS — R079 Chest pain, unspecified: Secondary | ICD-10-CM | POA: Insufficient documentation

## 2018-02-07 LAB — BASIC METABOLIC PANEL
ANION GAP: 8 (ref 5–15)
BUN: 10 mg/dL (ref 6–20)
CALCIUM: 9 mg/dL (ref 8.9–10.3)
CO2: 22 mmol/L (ref 22–32)
Chloride: 106 mmol/L (ref 98–111)
Creatinine, Ser: 0.74 mg/dL (ref 0.61–1.24)
GFR calc non Af Amer: >60 mL/min (ref >60)
GLUCOSE: 102 mg/dL — AB (ref 70–99)
POTASSIUM: 3.2 mmol/L — AB (ref 3.5–5.1)
SODIUM: 136 mmol/L (ref 135–145)

## 2018-02-07 LAB — URINALYSIS, ROUTINE W REFLEX MICROSCOPIC
Bilirubin Urine: NEGATIVE
GLUCOSE, UA: NEGATIVE mg/dL
Hgb urine dipstick: NEGATIVE
Ketones, ur: NEGATIVE mg/dL
LEUKOCYTES UA: NEGATIVE
Nitrite: NEGATIVE
PROTEIN: NEGATIVE mg/dL
SPECIFIC GRAVITY, URINE: 1.021 (ref 1.005–1.030)
pH: 6 (ref 5.0–8.0)

## 2018-02-07 LAB — TROPONIN I
TROPONIN I: 0.09 ng/mL — AB (ref <0.03)
TROPONIN I: 0.08 ng/mL — AB (ref <0.03)

## 2018-02-07 LAB — RAPID URINE DRUG SCREEN, HOSP PERFORMED
Amphetamines: POSITIVE — AB
BENZODIAZEPINES: NOT DETECTED
Barbiturates: NOT DETECTED
COCAINE: NOT DETECTED
Opiates: NOT DETECTED
Tetrahydrocannabinol: POSITIVE — AB

## 2018-02-07 LAB — CBC
HCT: 44.2 % (ref 39.0–52.0)
HEMOGLOBIN: 15.1 g/dL (ref 13.0–17.0)
MCH: 31.7 pg (ref 26.0–34.0)
MCHC: 34.2 g/dL (ref 30.0–36.0)
MCV: 92.7 fL (ref 80.0–100.0)
PLATELETS: 253 10*3/uL (ref 150–400)
RBC: 4.77 MIL/uL (ref 4.22–5.81)
RDW: 12.5 % (ref 11.5–15.5)
WBC: 10.2 10*3/uL (ref 4.0–10.5)
nRBC: 0 % (ref 0.0–0.2)

## 2018-02-07 LAB — ETHANOL

## 2018-02-07 NOTE — Discharge Instructions (Addendum)
The tests today are reassuring.  It does not appear that you have had a heart attack.  Sure that you are avoiding using any illegal drugs, get plenty of rest, and drink a lot of fluids like water.  You can try taking Tylenol if needed for pain.

## 2018-02-07 NOTE — ED Notes (Signed)
PT given po fluids at this time.

## 2018-02-07 NOTE — ED Notes (Signed)
Patient given discharge instruction, verbalized understand. IV removed, band aid applied. Patient ambulatory out of the department.  

## 2018-02-07 NOTE — ED Notes (Signed)
CRITICAL VALUE ALERT  Critical Value:  Trop 0.08  Date & Time Notied:  1748  Provider Notified: Dr. Effie ShyWentz  Orders Received/Actions taken: no new orders at this time.
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
## 2018-02-07 NOTE — ED Triage Notes (Signed)
Ems reports pt started having cp ago and was reproducible with palpation.  EMS says pt was very red from chest up to face.  Reports hadn't been doing anything strenuous,  Was eating when it started.  Reports being under  A lot of stress.  EMS started iv and gave 4baby asa and  1ntg.  Reports nitro took pain away.  Denies cp at this time.

## 2018-02-07 NOTE — ED Notes (Signed)
CRITICAL VALUE ALERT  Critical Value:  Trop 0.09  Date & Time Notied:  02/07/18, 1533  Provider Notified: Dr. Effie ShyWentz  Orders Received/Actions taken: none at this time

## 2018-10-22 ENCOUNTER — Emergency Department (HOSPITAL_COMMUNITY)
Admission: EM | Admit: 2018-10-22 | Discharge: 2018-10-23 | Discharge status: Against Medical Advice | Payer: Medicaid Other | Attending: Emergency Medicine | Admitting: Emergency Medicine

## 2018-10-22 ENCOUNTER — Other Ambulatory Visit: Payer: Self-pay

## 2018-10-22 ENCOUNTER — Encounter (HOSPITAL_COMMUNITY): Payer: Self-pay | Admitting: Emergency Medicine

## 2018-10-22 DIAGNOSIS — R569 Unspecified convulsions: Secondary | ICD-10-CM | POA: Diagnosis not present

## 2018-10-22 DIAGNOSIS — F17210 Nicotine dependence, cigarettes, uncomplicated: Secondary | ICD-10-CM | POA: Diagnosis not present

## 2018-10-22 DIAGNOSIS — J45909 Unspecified asthma, uncomplicated: Secondary | ICD-10-CM | POA: Diagnosis not present

## 2018-10-22 DIAGNOSIS — J449 Chronic obstructive pulmonary disease, unspecified: Secondary | ICD-10-CM | POA: Insufficient documentation

## 2018-10-22 MED ORDER — TOPIRAMATE 25 MG PO TABS
50.0000 mg | ORAL_TABLET | Freq: Once | ORAL | Status: DC
  Filled 2018-10-22: qty 2

## 2018-10-22 MED ORDER — DIVALPROEX SODIUM ER 500 MG PO TB24: 500.0000 mg | ORAL_TABLET | Freq: Three times a day (TID) | ORAL | 0 refills | Status: DC

## 2018-10-22 MED ORDER — VALPROATE SODIUM 500 MG/5ML IV SOLN: 1000.0000 mg | Freq: Once | INTRAVENOUS | Status: DC

## 2018-10-22 NOTE — ED Notes (Signed)
Pt refused to wait for treatment and Pt stated he wanted to leave in [facility] custody and leave AMA. Pt refused to sign out of system. MD Notified and aware.

## 2018-10-22 NOTE — ED Notes (Signed)
When RN Vivien Rota went to administer medications to Pt, Pt refused and ripped out both IV access. MD Notified.
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
## 2018-10-22 NOTE — Discharge Instructions (Signed)
If you do not take your seizure medication, you will have seizures.

## 2018-10-22 NOTE — ED Provider Notes (Signed)
[facility] EMERGENCY DEPARTMENT Provider Note   CSN: [id number] Arrival date & time: 10/22/18  2324    History   Chief Complaint Chief Complaint  Patient presents with  . Seizures    HPI [name] is a 53 y.o. male.   The history is provided by the patient.  He has history of seizure disorder, COPD, alcohol abuse and was brought in by police after having a seizure.  He was arrested for an assault and had a seizure in the police car.  He did have a bit tongue and is not sure if he had urinary incontinence.  He normally takes topiramate at bedtime and had not had his dose tonight.  He states he did have his usual dose last night.  He admits to having had 1 shot of alcohol earlier today.  Past Medical History:  Diagnosis Date  . Arthritis   . Asthma   . Chronic neck pain   . COPD (chronic obstructive pulmonary disease) (HCC)   . Injury of jawline   . Mental disorder   . Ruptured disc, cervical   . Seizures (HCC)   . Torn rotator cuff     Patient Active Problem List   Diagnosis Date Noted  . Protein-calorie malnutrition, severe 06/20/2016  . Aspiration pneumonia (HCC) 06/15/2016  . Metabolic encephalopathy 06/15/2016  . Alcohol abuse with intoxication (HCC) 06/12/2016  . COPD (chronic obstructive pulmonary disease) (HCC) 06/12/2016  . History of traumatic brain injury 06/12/2016  . Seizures (HCC) 06/12/2016  . Endotracheally intubated   . Respiratory failure (HCC)     History reviewed. No pertinent surgical history.      Home Medications    Prior to Admission medications   Medication Sig Start Date End Date Taking? Authorizing Provider  albuterol (PROVENTIL HFA;VENTOLIN HFA) 108 (90 BASE) MCG/ACT inhaler Inhale 2 puffs into the lungs every 6 (six) hours as needed. For shortness of breath    [provider]  ALPRAZolam (XANAX) 1 MG tablet Take 1 mg by mouth 3 (three) times daily as needed for anxiety.    [provider]   oxyCODONE-acetaminophen (PERCOCET) 10-325 MG tablet Take 1 tablet by mouth every 6 (six) hours as needed for pain.    [provider]  topiramate (TOPAMAX) 50 MG tablet Take 50 mg by mouth at bedtime.    [provider]    Family History Family History  Problem Relation Age of Onset  . Heart failure Father   . Cancer Father   . Alcoholism Brother     Social History Social History   Tobacco Use  . Smoking status: Current Some Day Smoker    Packs/day: 0.50    Types: Cigarettes, Cigars  . Smokeless tobacco: Never Used  Substance Use Topics  . Alcohol use: Yes    Alcohol/week: 12.0 standard drinks    Types: 12 Cans of beer per week    Comment: montly  . Drug use: No     Allergies   Patient has no known allergies.   Review of Systems Review of Systems  All other systems reviewed and are negative.    Physical Exam Updated Vital Signs BP 127/87   Pulse 83   Resp (!) 21   Wt 113.4 kg   SpO2 98%   BMI 32.98 kg/m   Physical Exam Vitals signs and nursing note reviewed.    53 year old male, resting comfortably and in no acute distress. Vital signs are normal. Oxygen saturation is  98%, which is normal. Head is normocephalic and atraumatic. PERRLA, EOMI. Oropharynx is clear. Neck is nontender and supple without adenopathy or JVD. Back is nontender and there is no CVA tenderness. Lungs are clear without rales, wheezes, or rhonchi. Chest is nontender. Heart has regular rate and rhythm without murmur. Abdomen is soft, flat, nontender without masses or hepatosplenomegaly and peristalsis is normoactive. Extremities have no cyanosis or edema, full range of motion is present. Skin is warm and dry without rash. Neurologic: Mental status is normal, cranial nerves are intact, there are no motor or sensory deficits.  ED Treatments / Results   Procedures Procedures   Medications Ordered in ED Medications - No data to display   Initial Impression /  Assessment and Plan / ED Course  I have reviewed the triage vital signs and the nursing notes.  Pertinent labs & imaging results that were available during my care of the patient were reviewed by me and considered in my medical decision making (see chart for details).  Seizure.  Old records are reviewed, and he had been placed on valproic acid 500 mg 3 times a day following his work-up for seizures as an inpatient in 2018.  At an ED visit on January 02, 2018, he still listed valproic as 1 of his medications, but it was no longer listed as a medication on February 07, 2018.  Patient is quite evasive when asked about why it was no longer listed.  I have requested that he be given oral topiramate and intravenous divalproex, but patient has ripped out his IVs and wishes to leave [name].  I will prescribe valproic acid for him.  Final Clinical Impressions(s) / ED Diagnoses   Final diagnoses:  Seizure [facility]    ED Discharge Orders         Ordered    divalproex (DEPAKOTE ER) 500 MG 24 hr tablet  3 times daily     10/22/18 5053           Delora Fuel, MD 97/67/34 [phone]

## 2018-10-22 NOTE — ED Triage Notes (Signed)
Pt brought in by [facility] accompanied by [employer]. Pt was arrested and placed in the back of police patrol car where he had 1 seizure. Pt states he did not take his Topamax today.

## 2018-10-25 ENCOUNTER — Encounter (HOSPITAL_COMMUNITY): Payer: Self-pay | Admitting: Emergency Medicine

## 2018-10-25 ENCOUNTER — Observation Stay (HOSPITAL_COMMUNITY)

## 2018-10-25 ENCOUNTER — Observation Stay (HOSPITAL_COMMUNITY)
Admission: EM | Admit: 2018-10-25 | Discharge: 2018-10-27 | Disposition: A | Discharge status: Home / Self Care | Attending: Pulmonary Disease | Admitting: Pulmonary Disease

## 2018-10-25 ENCOUNTER — Emergency Department (HOSPITAL_COMMUNITY)

## 2018-10-25 ENCOUNTER — Other Ambulatory Visit: Payer: Self-pay

## 2018-10-25 DIAGNOSIS — R569 Unspecified convulsions: Secondary | ICD-10-CM | POA: Diagnosis not present

## 2018-10-25 DIAGNOSIS — Z79899 Other long term (current) drug therapy: Secondary | ICD-10-CM | POA: Insufficient documentation

## 2018-10-25 DIAGNOSIS — Z23 Encounter for immunization: Secondary | ICD-10-CM | POA: Insufficient documentation

## 2018-10-25 DIAGNOSIS — J45909 Unspecified asthma, uncomplicated: Secondary | ICD-10-CM | POA: Diagnosis not present

## 2018-10-25 DIAGNOSIS — R079 Chest pain, unspecified: Secondary | ICD-10-CM | POA: Diagnosis not present

## 2018-10-25 DIAGNOSIS — F419 Anxiety disorder, unspecified: Secondary | ICD-10-CM | POA: Diagnosis not present

## 2018-10-25 DIAGNOSIS — F17210 Nicotine dependence, cigarettes, uncomplicated: Secondary | ICD-10-CM | POA: Insufficient documentation

## 2018-10-25 DIAGNOSIS — J449 Chronic obstructive pulmonary disease, unspecified: Secondary | ICD-10-CM | POA: Insufficient documentation

## 2018-10-25 DIAGNOSIS — Z20828 Contact with and (suspected) exposure to other viral communicable diseases: Secondary | ICD-10-CM | POA: Insufficient documentation

## 2018-10-25 DIAGNOSIS — R55 Syncope and collapse: Principal | ICD-10-CM | POA: Insufficient documentation

## 2018-10-25 DIAGNOSIS — Z87820 Personal history of traumatic brain injury: Secondary | ICD-10-CM | POA: Insufficient documentation

## 2018-10-25 LAB — GLUCOSE, CAPILLARY: Glucose-Capillary: 98 mg/dL (ref 70–99)

## 2018-10-25 LAB — BASIC METABOLIC PANEL
Anion gap: 10 (ref 5–15)
BUN: 10 mg/dL (ref 6–20)
CO2: 19 mmol/L — ABNORMAL LOW (ref 22–32)
Calcium: 9 mg/dL (ref 8.9–10.3)
Chloride: 109 mmol/L (ref 98–111)
Creatinine, Ser: 0.97 mg/dL (ref 0.61–1.24)
GFR calc Af Amer: >60 mL/min (ref >60)
GFR calc non Af Amer: >60 mL/min (ref >60)
Glucose, Bld: 139 mg/dL — ABNORMAL HIGH (ref 70–99)
Potassium: 3.9 mmol/L (ref 3.5–5.1)
Sodium: 138 mmol/L (ref 135–145)

## 2018-10-25 LAB — CBC
HCT: 47.5 % (ref 39.0–52.0)
Hemoglobin: 15.9 g/dL (ref 13.0–17.0)
MCH: 31.2 pg (ref 26.0–34.0)
MCHC: 33.5 g/dL (ref 30.0–36.0)
MCV: 93.3 fL (ref 80.0–100.0)
Platelets: 241 10*3/uL (ref 150–400)
RBC: 5.09 MIL/uL (ref 4.22–5.81)
RDW: 13.3 % (ref 11.5–15.5)
WBC: 8 10*3/uL (ref 4.0–10.5)
nRBC: 0 % (ref 0.0–0.2)

## 2018-10-25 LAB — CBG MONITORING, ED: Glucose-Capillary: 145 mg/dL — ABNORMAL HIGH (ref 70–99)

## 2018-10-25 LAB — TROPONIN I (HIGH SENSITIVITY)
Troponin I (High Sensitivity): 5 ng/L (ref <18)
Troponin I (High Sensitivity): 5 ng/L (ref <18)

## 2018-10-25 LAB — VALPROIC ACID LEVEL: Valproic Acid Lvl: 51 ug/mL (ref 50.0–100.0)

## 2018-10-25 LAB — SARS CORONAVIRUS 2 BY RT PCR (HOSPITAL ORDER, PERFORMED IN ~~LOC~~ HOSPITAL LAB): SARS Coronavirus 2: NEGATIVE

## 2018-10-25 MED ORDER — ENOXAPARIN SODIUM 60 MG/0.6ML ~~LOC~~ SOLN
0.5000 mg/kg | SUBCUTANEOUS | Status: DC
  Administered 2018-10-25 – 2018-10-26 (×2): 55 mg via SUBCUTANEOUS
  Filled 2018-10-25 (×2): qty 0.6

## 2018-10-25 MED ORDER — TOPIRAMATE 25 MG PO TABS
50.0000 mg | ORAL_TABLET | Freq: Every day | ORAL | Status: DC
  Administered 2018-10-25 – 2018-10-26 (×2): 50 mg via ORAL
  Filled 2018-10-25 (×2): qty 2

## 2018-10-25 MED ORDER — LEVETIRACETAM IN NACL 1000 MG/100ML IV SOLN
1000.0000 mg | Freq: Two times a day (BID) | INTRAVENOUS | Status: DC
  Administered 2018-10-25 – 2018-10-27 (×4): 1000 mg via INTRAVENOUS
  Filled 2018-10-25 (×4): qty 100

## 2018-10-25 MED ORDER — ONDANSETRON HCL 4 MG/2ML IJ SOLN: 4.0000 mg | Freq: Four times a day (QID) | INTRAMUSCULAR | Status: DC | PRN

## 2018-10-25 MED ORDER — OXYCODONE-ACETAMINOPHEN 10-325 MG PO TABS: 1.0000 | ORAL_TABLET | Freq: Four times a day (QID) | ORAL | Status: DC | PRN

## 2018-10-25 MED ORDER — INFLUENZA VAC SPLIT QUAD 0.5 ML IM SUSY
0.5000 mL | PREFILLED_SYRINGE | INTRAMUSCULAR | Status: AC
  Administered 2018-10-26: 09:00:00 0.5 mL via INTRAMUSCULAR
  Filled 2018-10-25: qty 0.5

## 2018-10-25 MED ORDER — DIVALPROEX SODIUM ER 500 MG PO TB24
500.0000 mg | ORAL_TABLET | Freq: Three times a day (TID) | ORAL | Status: DC
  Administered 2018-10-25 – 2018-10-27 (×5): 500 mg via ORAL
  Filled 2018-10-25 (×5): qty 1

## 2018-10-25 MED ORDER — ONDANSETRON HCL 4 MG PO TABS: 4.0000 mg | ORAL_TABLET | Freq: Four times a day (QID) | ORAL | Status: DC | PRN

## 2018-10-25 MED ORDER — SODIUM CHLORIDE 0.9% FLUSH
3.0000 mL | Freq: Two times a day (BID) | INTRAVENOUS | Status: DC
  Administered 2018-10-26 – 2018-10-27 (×3): 3 mL via INTRAVENOUS

## 2018-10-25 MED ORDER — OXYCODONE HCL 5 MG PO TABS
5.0000 mg | ORAL_TABLET | Freq: Four times a day (QID) | ORAL | Status: DC | PRN
  Administered 2018-10-25 – 2018-10-26 (×3): 5 mg via ORAL
  Filled 2018-10-25 (×3): qty 1

## 2018-10-25 MED ORDER — OXYCODONE-ACETAMINOPHEN 5-325 MG PO TABS
1.0000 | ORAL_TABLET | Freq: Four times a day (QID) | ORAL | Status: DC | PRN
  Administered 2018-10-25 – 2018-10-27 (×5): 1 via ORAL
  Filled 2018-10-25 (×5): qty 1

## 2018-10-25 MED ORDER — ALBUTEROL SULFATE (2.5 MG/3ML) 0.083% IN NEBU: 3.0000 mL | INHALATION_SOLUTION | Freq: Four times a day (QID) | RESPIRATORY_TRACT | Status: DC | PRN

## 2018-10-25 MED ORDER — ALPRAZOLAM 1 MG PO TABS
1.0000 mg | ORAL_TABLET | Freq: Three times a day (TID) | ORAL | Status: DC | PRN
  Administered 2018-10-26 – 2018-10-27 (×4): 1 mg via ORAL
  Filled 2018-10-25 (×5): qty 1

## 2018-10-25 NOTE — ED Triage Notes (Signed)
Pt was walking through the jail at lunch and fell in the floor having a presumed syncopal episode. Pt then began having chest pain and was given Aspirin 324 by ems along with nitro x2.

## 2018-10-25 NOTE — ED Notes (Addendum)
Pt back in room from radiology. Per radiology staff pt had "two mini episodes of being in a daze". Pt alert and oriented at time of arrival back in room. nad noted. EDP aware, no new orders given at this time.

## 2018-10-25 NOTE — ED Notes (Signed)
Pt found in floor at bedside. Pt had climbed over bed railing and hit r side of face on floor. Post-fall assessment completed per protocol and documented accordingly. Dr Nehemiah Settle notified and new orders given. Pt being admitted to floor and report had already been called at time of fall. Notified floor nurse that would be assuming care of pt of fall as well as post fall orders. Will continue to monitor.

## 2018-10-25 NOTE — ED Notes (Signed)
Pt given something to drink. Tolerating well

## 2018-10-25 NOTE — H&P (Signed)
History and Physical  [name] UMP:[id number] DOB: 07-29-1965 DOA: 10/25/2018  Referring physician: Dr Rogene Houston, ED physician PCP: Sinda Du, MD  Outpatient Specialists: n/a  Patient Coming From: jail  Chief Complaint: Syncope  HPI: [name] is a 53 y.o. male with a history of COPD, seizure disorder.  Patient seen for syncopal episode at jail.  He also admitted to some substernal chest pain when he woke up.  EMS was called and the patient received aspirin and nitroglycerin.  This improved his chest pain.  Patient was sent to the hospital for evaluation.  He had 2 further episodes of syncope in the emergency department that were self-limiting and the patient awoke shortly afterwards.  No seizure-like activities and the patient has been compliant with his seizure medication.  Denies fevers, chills, palpitations, shortness of breath, nausea, vomiting, diarrhea, abdominal pain.  Emergency Department Course: CT of the head is normal.  Chest x-ray normal.  Labs normal.  Troponin negative  Review of Systems:   Pt denies any fevers, chills, nausea, vomiting, diarrhea, constipation, abdominal pain, shortness of breath, dyspnea on exertion, orthopnea, cough, wheezing, palpitations, headache, vision changes, lightheadedness, dizziness, melena, rectal bleeding.  Review of systems are otherwise negative  Past Medical History:  Diagnosis Date  . Arthritis   . Asthma   . Chronic neck pain   . COPD (chronic obstructive pulmonary disease) ([place])   . Injury of jawline   . Mental disorder   . Ruptured disc, cervical   . Seizures ([place])   . Torn rotator cuff    History reviewed. No pertinent surgical history. Social History:  reports that he has been smoking cigarettes and cigars. He has been smoking about 0.50 packs per day. He has never used smokeless tobacco. He reports current alcohol use of about 12.0 standard drinks of alcohol per week. He reports that he does not use drugs.  Patient lives at home but is currently in jail for the past 2 days  No Known Allergies  Family History  Problem Relation Age of Onset  . Heart failure Father   . Cancer Father   . Alcoholism Brother       Prior to Admission medications   Medication Sig Start Date End Date Taking? Authorizing Provider  albuterol (PROVENTIL HFA;VENTOLIN HFA) 108 (90 BASE) MCG/ACT inhaler Inhale 2 puffs into the lungs every 6 (six) hours as needed. For shortness of breath    [provider]  ALPRAZolam (XANAX) 1 MG tablet Take 1 mg by mouth 3 (three) times daily as needed for anxiety.    [provider]  divalproex (DEPAKOTE ER) 500 MG 24 hr tablet Take 1 tablet (500 mg total) by mouth 3 (three) times daily. 4/0/08   Delora Fuel, MD  oxyCODONE-acetaminophen (PERCOCET) 10-325 MG tablet Take 1 tablet by mouth every 6 (six) hours as needed for pain.    [provider]  topiramate (TOPAMAX) 50 MG tablet Take 50 mg by mouth at bedtime.    [provider]    Physical Exam: BP 136/88   Pulse (!) 59   Temp (!) 97.5 F (36.4 C) (Oral)   Resp 15   Ht 6\' 1"  (1.854 m)   Wt 113.4 kg   SpO2 98%   BMI 32.98 kg/m   . General: Middle-age male. Awake and alert and oriented x3. No acute cardiopulmonary distress.  Marland Kitchen HEENT: Normocephalic atraumatic.  Right and left ears normal in appearance.  Pupils equal, round, reactive to light. Extraocular  muscles are intact. Sclerae anicteric and noninjected.  Moist mucosal membranes. No mucosal lesions.  . Neck: Neck supple without lymphadenopathy. No carotid bruits. No masses palpated.  . Cardiovascular: Regular rate with normal S1-S2 sounds. No murmurs, rubs, gallops auscultated. No JVD.  Marland Kitchen. Respiratory: Good respiratory effort with no wheezes, rales, rhonchi. Lungs clear to auscultation bilaterally.  No accessory muscle use. . Abdomen: Soft, nontender, nondistended. Active bowel sounds. No masses or hepatosplenomegaly  . Skin: No rashes,  lesions, or ulcerations.  Dry, warm to touch. 2+ dorsalis pedis and radial pulses. . Musculoskeletal: No calf or leg pain. All major joints not erythematous nontender.  No upper or lower joint deformation.  Good ROM.  No contractures  . Psychiatric: Intact judgment and insight. Pleasant and cooperative. . Neurologic: No focal neurological deficits. Strength is 5/5 and symmetric in upper and lower extremities.  Cranial nerves II through XII are grossly intact.           Labs on Admission: I have personally reviewed following labs and imaging studies  CBC: Recent Labs  Lab 10/25/18 1311  WBC 8.0  HGB 15.9  HCT 47.5  MCV 93.3  PLT 241   Basic Metabolic Panel: Recent Labs  Lab 10/25/18 1311  NA 138  K 3.9  CL 109  CO2 19*  GLUCOSE 139*  BUN 10  CREATININE 0.97  CALCIUM 9.0   GFR: Estimated Creatinine Clearance: 116.2 mL/min (by C-G formula based on SCr of 0.97 mg/dL). Liver Function Tests: No results for input(s): AST, ALT, ALKPHOS, BILITOT, PROT, ALBUMIN in the last 168 hours. No results for input(s): LIPASE, AMYLASE in the last 168 hours. No results for input(s): AMMONIA in the last 168 hours. Coagulation Profile: No results for input(s): INR, PROTIME in the last 168 hours. Cardiac Enzymes: No results for input(s): CKTOTAL, CKMB, CKMBINDEX, TROPONINI in the last 168 hours. BNP (last 3 results) No results for input(s): PROBNP in the last 8760 hours. HbA1C: No results for input(s): HGBA1C in the last 72 hours. CBG: Recent Labs  Lab 10/25/18 1306  GLUCAP 145*   Lipid Profile: No results for input(s): CHOL, HDL, LDLCALC, TRIG, CHOLHDL, LDLDIRECT in the last 72 hours. Thyroid Function Tests: No results for input(s): TSH, T4TOTAL, FREET4, T3FREE, THYROIDAB in the last 72 hours. Anemia Panel: No results for input(s): VITAMINB12, FOLATE, FERRITIN, TIBC, IRON, RETICCTPCT in the last 72 hours. Urine analysis:    Component Value Date/Time   COLORURINE YELLOW  02/07/2018 1535   APPEARANCEUR CLEAR 02/07/2018 1535   LABSPEC 1.021 02/07/2018 1535   PHURINE 6.0 02/07/2018 1535   GLUCOSEU NEGATIVE 02/07/2018 1535   HGBUR NEGATIVE 02/07/2018 1535   BILIRUBINUR NEGATIVE 02/07/2018 1535   KETONESUR NEGATIVE 02/07/2018 1535   PROTEINUR NEGATIVE 02/07/2018 1535   UROBILINOGEN 0.2 02/04/2010 0510   NITRITE NEGATIVE 02/07/2018 1535   LEUKOCYTESUR NEGATIVE 02/07/2018 1535   Sepsis Labs: @LABRCNTIP (procalcitonin:4,lacticidven:4) )No results found for this or any previous visit (from the past 240 hour(s)).   Radiological Exams on Admission: Dg Chest 2 View  Result Date: 10/25/2018 CLINICAL DATA:  syncope episode today. Pt states "It's been a rough two months". Pt states he has been dizzy and is c/o chest pain onset for several weeks. During 2 view CXR pt stated he was getting dizzy and suddenly slumped over in the stretcher. Pt woke and stated he "felt dizzy and passed out EXAM: CHEST - 2 VIEW COMPARISON:  02/07/2018 FINDINGS: Lungs are clear. Heart size and mediastinal contours are within normal limits.  No effusion. Visualized bones unremarkable. IMPRESSION: No acute cardiopulmonary disease. Electronically Signed   By: Corlis Leak M.D.   On: 10/25/2018 14:40   Ct Head Wo Contrast  Result Date: 10/25/2018 CLINICAL DATA:  Syncope, fell EXAM: CT HEAD WITHOUT CONTRAST TECHNIQUE: Contiguous axial images were obtained from the base of the skull through the vertex without intravenous contrast. COMPARISON:  08/20/2016 and previous FINDINGS: Brain: No evidence of acute infarction, hemorrhage, hydrocephalus, extra-axial collection or mass lesion/mass effect. Vascular: No hyperdense vessel or unexpected calcification. Skull: Normal. Negative for fracture or focal lesion. Sinuses/Orbits: No acute finding. Other: None IMPRESSION: Negative for bleed or other acute intracranial process. Electronically Signed   By: Corlis Leak M.D.   On: 10/25/2018 14:50    EKG: Independently  reviewed.  Sinus rhythm with baseline wander.  No acute ST changes  Assessment/Plan: Active Problems:   COPD (chronic obstructive pulmonary disease) (HCC)   Seizures (HCC)   Syncope and collapse    This patient was discussed with the ED physician, including pertinent vitals, physical exam findings, labs, and imaging.  We also discussed care given by the ED provider.  1. Syncope a. Observation b. Telemetry monitoring c. TSH, repeat labs in the morning d. Echo in the morning 2. Seizures a. Continue antiepileptics 3. COPD  DVT prophylaxis: Lovenox Consultants: None Code Status: Full code Family Communication: None Disposition Plan: Patient to return back to jail following evaluation   Levie Heritage, DO

## 2018-10-25 NOTE — Progress Notes (Signed)
Patient ID: [name], male   DOB: 04-27-1965, 53 y.o.   MRN: [id number]  Notified that patient had seizure, fell off bed and hit head on floor. Will bolus with keppra 1000mg  and get head CT.

## 2018-10-25 NOTE — ED Provider Notes (Signed)
[facility]ENCY [facility]PARTMENT Provider Note   CSN: [id number] Arrival date & time: 10/25/18  1244     History   Chief Complaint Chief Complaint  Patient presents with  . Near Syncope    HPI Roselle LocusSteven L Weichert is a 53 y.o. male.     HPI  Pt was seen at 1330. Per EMS and pt, c/o sudden onset and resolution of one episode of syncope that occurred PTA. Pt [place] and "woke up on the floor with everyone around me." Pt then states he developed mid-sternal chest "pains." EMS gave ASA and SL ntg x2 with improvement in CP. No reported seizure activity. Pt endorses compliance with seizure meds. Denies palpitations, no SOB/cough, no N/V/D, no abd pain, no back or neck pain, no focal motor weakness.   Past Medical History:  Diagnosis Date  . Arthritis   . Asthma   . Chronic neck pain   . COPD (chronic obstructive pulmonary disease) (HCC)   . Injury of jawline   . Mental disorder   . Ruptured disc, cervical   . Seizures (HCC)   . Torn rotator cuff     Patient Active Problem List   Diagnosis Date Noted  . Protein-calorie malnutrition, severe 06/20/2016  . Aspiration pneumonia (HCC) 06/15/2016  . Metabolic encephalopathy 06/15/2016  . Alcohol abuse with intoxication (HCC) 06/12/2016  . COPD (chronic obstructive pulmonary disease) (HCC) 06/12/2016  . History of traumatic brain injury 06/12/2016  . Seizures (HCC) 06/12/2016  . Endotracheally intubated   . Respiratory failure (HCC)     History reviewed. No pertinent surgical history.      Home Medications    Prior to Admission medications   Medication Sig Start Date End Date Taking? Authorizing Provider  albuterol (PROVENTIL HFA;VENTOLIN HFA) 108 (90 BASE) MCG/ACT inhaler Inhale 2 puffs into the lungs every 6 (six) hours as needed. For shortness of breath    [provider]  ALPRAZolam (XANAX) 1 MG tablet Take 1 mg by mouth 3 (three) times daily as needed for anxiety.     [provider]  divalproex (DEPAKOTE ER) 500 MG 24 hr tablet Take 1 tablet (500 mg total) by mouth 3 (three) times daily. 10/22/18   Dione BoozeGlick, David, MD  oxyCODONE-acetaminophen (PERCOCET) 10-325 MG tablet Take 1 tablet by mouth every 6 (six) hours as needed for pain.    [provider]  topiramate (TOPAMAX) 50 MG tablet Take 50 mg by mouth at bedtime.    [provider]    Family History Family History  Problem Relation Age of Onset  . Heart failure Father   . Cancer Father   . Alcoholism Brother     Social History Social History   Tobacco Use  . Smoking status: Current Some Day Smoker    Packs/day: 0.50    Types: Cigarettes, Cigars  . Smokeless tobacco: Never Used  Substance Use Topics  . Alcohol use: Yes    Alcohol/week: 12.0 standard drinks    Types: 12 Cans of beer per week    Comment: montly  . Drug use: No     Allergies   Patient has no known allergies.   Review of Systems Review of Systems ROS: Statement: All systems negative except as marked or noted in the HPI; Constitutional: Negative for fever and chills. ; ; Eyes: Negative for eye pain, redness and discharge. ; ; ENMT: Negative for ear pain, hoarseness, nasal congestion, sinus pressure and sore throat. ; ;  Cardiovascular: +CP. Negative for palpitations, diaphoresis, dyspnea and peripheral edema. ; ; Respiratory: Negative for cough, wheezing and stridor. ; ; Gastrointestinal: Negative for nausea, vomiting, diarrhea, abdominal pain, blood in stool, hematemesis, jaundice and rectal bleeding. . ; ; Genitourinary: Negative for dysuria, flank pain and hematuria. ; ; Musculoskeletal: Negative for back pain and neck pain. Negative for swelling and trauma.; ; Skin: Negative for pruritus, rash, abrasions, blisters, bruising and skin lesion.; ; Neuro: Negative for headache, lightheadedness and neck stiffness. Negative for weakness, extremity weakness, paresthesias, involuntary movement, seizure and  +syncope.       Physical Exam Updated Vital Signs BP 114/68   Pulse 72   Temp (!) 97.5 F (36.4 C) (Oral)   Resp 20   Ht 6\' 1"  (1.854 m)   Wt 113.4 kg   SpO2 98%   BMI 32.98 kg/m    Patient Vitals for the past 24 hrs:  BP Temp Temp src Pulse Resp SpO2 Height Weight  10/25/18 1500 114/68 - - 72 20 98 % - -  10/25/18 1430 - - - 64 16 99 % - -  10/25/18 1400 106/61 - - 63 14 99 % - -  10/25/18 1330 112/71 - - 74 (!) 21 99 % - -  10/25/18 1303 113/75 - - 68 (!) 24 95 % - -  10/25/18 1250 - - - - - - 6\' 1"  (1.854 m) 113.4 kg  10/25/18 1249 109/64 (!) 97.5 F (36.4 C) Oral 72 18 98 % - -  10/25/18 1247 109/64 - - 71 - 98 % - -     Physical Exam 1335: Physical examination:  Nursing notes reviewed; Vital signs and O2 SAT reviewed;  Constitutional: Well developed, Well nourished, Well hydrated, In no acute distress; Head:  Normocephalic, atraumatic; Eyes: EOMI, PERRL, No scleral icterus; ENMT: Mouth and pharynx normal, Mucous membranes moist; Neck: Supple, Full range of motion, No lymphadenopathy; Cardiovascular: Regular rate and rhythm, No gallop; Respiratory: Breath sounds clear & equal bilaterally, No wheezes.  Speaking full sentences with ease, Normal respiratory effort/excursion; Chest: Nontender, Movement normal; Abdomen: Soft, Nontender, Nondistended, Normal bowel sounds; Genitourinary: No CVA tenderness; Spine:  No midline CS, TS, LS tenderness.;; Extremities: Peripheral pulses normal, No tenderness, No edema, No calf edema or asymmetry.; Neuro: AA&Ox3, Major CN grossly intact.  Speech clear. No facial droop. No gross focal motor or sensory deficits in extremities.; Skin: Color normal, Warm, Dry.   ED Treatments / Results  Labs (all labs ordered are listed, but only abnormal results are displayed)   EKG EKG Interpretation  Date/Time:  Saturday October 25 2018 12:57:31 EDT Ventricular Rate:  75 PR Interval:    QRS Duration: 100 QT Interval:  395 QTC Calculation: 442  R Axis:   81 Text Interpretation:  Sinus rhythm Baseline wander When compared with ECG of 01/02/2018 No significant change was found Confirmed by Francine Graven [phone]) on 10/25/2018 1:39:40 PM   Radiology   Procedures Procedures (including critical care time)  Medications Ordered in ED Medications - No data to display   Initial Impression / Assessment and Plan / ED Course  I have reviewed the triage vital signs and the nursing notes.  Pertinent labs & imaging results that were available during my care of the patient were reviewed by me and considered in my medical decision making (see chart for details).    MDM Reviewed: previous chart, nursing note and vitals Reviewed previous: labs and ECG Interpretation: labs, ECG, x-ray and CT scan  Results for orders placed or performed during the hospital encounter of 10/25/18  Basic metabolic panel  Result Value Ref Range   Sodium 138 135 - 145 mmol/L   Potassium 3.9 3.5 - 5.1 mmol/L   Chloride 109 98 - 111 mmol/L   CO2 19 (L) 22 - 32 mmol/L   Glucose, Bld 139 (H) 70 - 99 mg/dL   BUN 10 6 - 20 mg/dL   Creatinine, Ser 1.610.97 0.61 - 1.24 mg/dL   Calcium 9.0 8.9 - 09.610.3 mg/dL   GFR calc non Af Amer >60 >60 mL/min   GFR calc Af Amer >60 >60 mL/min   Anion gap 10 5 - 15  CBC  Result Value Ref Range   WBC 8.0 4.0 - 10.5 K/uL   RBC 5.09 4.22 - 5.81 MIL/uL   Hemoglobin 15.9 13.0 - 17.0 g/dL   HCT 04.547.5 40.939.0 - 81.152.0 %   MCV 93.3 80.0 - 100.0 fL   MCH 31.2 26.0 - 34.0 pg   MCHC 33.5 30.0 - 36.0 g/dL   RDW 91.413.3 78.211.5 - 95.615.5 %   Platelets 241 150 - 400 K/uL   nRBC 0.0 0.0 - 0.2 %  Valproic acid level  Result Value Ref Range   Valproic Acid Lvl 51 50.0 - 100.0 ug/mL  CBG monitoring, ED  Result Value Ref Range   Glucose-Capillary 145 (H) 70 - 99 mg/dL  Troponin I (High Sensitivity)  Result Value Ref Range   Troponin I (High Sensitivity) 5 <18 ng/L   Dg Chest 2 View Result Date: 10/25/2018 CLINICAL DATA:  syncope episode today.  Pt states "It's been a rough two months". Pt states he has been dizzy and is c/o chest pain onset for several weeks. During 2 view CXR pt stated he was getting dizzy and suddenly slumped over in the stretcher. Pt woke and stated he "felt dizzy and passed out EXAM: CHEST - 2 VIEW COMPARISON:  02/07/2018 FINDINGS: Lungs are clear. Heart size and mediastinal contours are within normal limits. No effusion. Visualized bones unremarkable. IMPRESSION: No acute cardiopulmonary disease. Electronically Signed   By: Corlis Leak  Hassell M.D.   On: 10/25/2018 14:40   Ct Head Wo Contrast Result Date: 10/25/2018 CLINICAL DATA:  Syncope, fell EXAM: CT HEAD WITHOUT CONTRAST TECHNIQUE: Contiguous axial images were obtained from the base of the skull through the vertex without intravenous contrast. COMPARISON:  08/20/2016 and previous FINDINGS: Brain: No evidence of acute infarction, hemorrhage, hydrocephalus, extra-axial collection or mass lesion/mass effect. Vascular: No hyperdense vessel or unexpected calcification. Skull: Normal. Negative for fracture or focal lesion. Sinuses/Orbits: No acute finding. Other: None IMPRESSION: Negative for bleed or other acute intracranial process. Electronically Signed   By: Corlis Leak  Hassell M.D.   On: 10/25/2018 14:50   Roselle LocusSteven L Beer was evaluated in Emergency Department on 10/25/2018 for the symptoms described in the history of present illness. He was evaluated in the context of the global COVID-19 pandemic, which necessitated consideration that the patient might be at risk for infection with the SARS-CoV-2 virus that causes COVID-19. Institutional protocols and algorithms that pertain to the evaluation of patients at risk for COVID-19 are in a state of rapid change based on information released by regulatory bodies including the CDC and federal and state organizations. These policies and algorithms were followed during the patient's care in the ED.   1450:  Pt apparently had 2 more brief syncopal  episodes while in XR/CT. Rads Tech denies shaking episodes, just reported pt "looked dazed." No  AMS afterwards, no incont bowel/bladder. Workup reassuring. Will admit.  1530: Awaiting [facility] MD call back. Sign out to Dr. Deretha Emory.     Final Clinical Impressions(s) / ED Diagnoses   Final diagnoses:  Syncope and collapse  Chest pain in adult    ED Discharge Orders    None       Samuel Jester, DO 10/25/18 1536

## 2018-10-26 ENCOUNTER — Observation Stay (HOSPITAL_BASED_OUTPATIENT_CLINIC_OR_DEPARTMENT_OTHER)

## 2018-10-26 DIAGNOSIS — R55 Syncope and collapse: Secondary | ICD-10-CM

## 2018-10-26 LAB — BASIC METABOLIC PANEL
Anion gap: 11 (ref 5–15)
BUN: 13 mg/dL (ref 6–20)
CO2: 22 mmol/L (ref 22–32)
Calcium: 9 mg/dL (ref 8.9–10.3)
Chloride: 106 mmol/L (ref 98–111)
Creatinine, Ser: 1.1 mg/dL (ref 0.61–1.24)
GFR calc Af Amer: >60 mL/min (ref >60)
GFR calc non Af Amer: >60 mL/min (ref >60)
Glucose, Bld: 101 mg/dL — ABNORMAL HIGH (ref 70–99)
Potassium: 4 mmol/L (ref 3.5–5.1)
Sodium: 139 mmol/L (ref 135–145)

## 2018-10-26 LAB — TSH: TSH: 0.665 u[IU]/mL (ref 0.350–4.500)

## 2018-10-26 LAB — MRSA PCR SCREENING: MRSA by PCR: NEGATIVE

## 2018-10-26 NOTE — Progress Notes (Signed)
Bed alarm was irritating patient and guard.  Even on the lowest setting, when the patient would go to turn over because of the way he turned, it would go off.  Guard that was in the room at the time requested that I turn it off.  I explained why it was on and that he had fallen earlier.  Guard still wanting it off.  Patient is also requesting that it be turned off.  Turn off bed alarm. Guard did say if he got up, he would make sure he called [place] to help [place] get him to and from the bathroom.  Guard and patient were compliant with this and called to have assistance to the bathroom.

## 2018-10-26 NOTE — Progress Notes (Signed)
  Echocardiogram 2D Echocardiogram has been performed.  Darlina Sicilian M 10/26/2018, 2:39 PM

## 2018-10-26 NOTE — Progress Notes (Signed)
Subjective: He was admitted from jail after having a syncopal episode and chest discomfort.  EMS was called and he got aspirin and nitroglycerin.  He has a seizure disorder and had another seizure last night so his syncopal episode may have been related to that.  CT was done after he had seizure on the floor and struck his head and it was negative for acute injury.  He says he feels okay now.  No chest pain.  Objective: Vital signs in last 24 hours: Temp:  [97.5 F (36.4 C)-98 F (36.7 C)] 98 F (36.7 C) (09/13 0458) Pulse Rate:  [58-74] 66 (09/13 0458) Resp:  [14-24] 18 (09/13 0458) BP: (106-146)/(61-91) 110/63 (09/13 0458) SpO2:  [92 %-100 %] 100 % (09/13 0458) Weight:  [113.4 kg] 113.4 kg (09/12 1250) Weight change:  Last BM Date: 10/25/18  Intake/Output from previous day: 09/12 0701 - 09/13 0700 In: 127.5 [P.O.:120; IV Piggyback:7.5] Out: -   PHYSICAL EXAM General appearance: alert, cooperative and no distress Resp: clear to auscultation bilaterally Cardio: regular rate and rhythm, S1, S2 normal, no murmur, click, rub or gallop GI: soft, non-tender; bowel sounds normal; no masses,  no organomegaly Extremities: extremities normal, atraumatic, no cyanosis or edema  Lab Results:  Results for orders placed or performed during the hospital encounter of 10/25/18 (from the past 48 hour(s))  CBG monitoring, ED     Status: Abnormal   Collection Time: 10/25/18  1:06 PM  Result Value Ref Range   Glucose-Capillary 145 (H) 70 - 99 mg/dL  Basic metabolic panel     Status: Abnormal   Collection Time: 10/25/18  1:11 PM  Result Value Ref Range   Sodium 138 135 - 145 mmol/L   Potassium 3.9 3.5 - 5.1 mmol/L   Chloride 109 98 - 111 mmol/L   CO2 19 (L) 22 - 32 mmol/L   Glucose, Bld 139 (H) 70 - 99 mg/dL   BUN 10 6 - 20 mg/dL   Creatinine, Ser 1.610.97 0.61 - 1.24 mg/dL   Calcium 9.0 8.9 - 09.610.3 mg/dL   GFR calc non Af Amer >60 >60 mL/min   GFR calc Af Amer >60 >60 mL/min   Anion gap 10 5 -  15    Comment: Performed at [facility], [address] [place], [place] [address]  CBC     Status: None   Collection Time: 10/25/18  1:11 PM  Result Value Ref Range   WBC 8.0 4.0 - 10.5 K/uL   RBC 5.09 4.22 - 5.81 MIL/uL   Hemoglobin 15.9 13.0 - 17.0 g/dL   HCT 98.147.5 19.139.0 - 47.852.0 %   MCV 93.3 80.0 - 100.0 fL   MCH 31.2 26.0 - 34.0 pg   MCHC 33.5 30.0 - 36.0 g/dL   RDW 29.513.3 62.111.5 - 30.815.5 %   Platelets 241 150 - 400 K/uL   nRBC 0.0 0.0 - 0.2 %    Comment: Performed at [facility], [address] [place], [place] [address]  Troponin I (High Sensitivity)     Status: None   Collection Time: 10/25/18  1:11 PM  Result Value Ref Range   Troponin I (High Sensitivity) 5 <18 ng/L    Comment: (NOTE) Elevated high sensitivity troponin I (hsTnI) values and significant  changes across serial measurements may suggest ACS but many other  chronic and acute conditions are known to elevate hsTnI results.  Refer to the "Links" section for chest pain algorithms and additional  guidance. Performed at [facility], [address] [place], [place] [address]   Valproic acid level     Status: None   Collection Time: 10/25/18  1:11 PM  Result Value Ref Range   Valproic Acid Lvl 51 50.0 - 100.0 ug/mL    Comment: Performed at [facility], [address] [place], [place] [address]  TSH     Status: None   Collection Time: 10/25/18  1:11 PM  Result Value Ref Range   TSH 0.665 0.350 - 4.500 uIU/mL    Comment: Performed by a 3rd Generation assay with a functional sensitivity of <=0.01 uIU/mL. Performed at [facility], [address] [place], [place] [address]   Troponin I (High Sensitivity)     Status: None   Collection Time: 10/25/18  3:20 PM  Result Value Ref Range   Troponin I (High Sensitivity) 5 <18 ng/L    Comment: (NOTE) Elevated high sensitivity troponin I (hsTnI) values and significant  changes across serial measurements may suggest ACS but many other  chronic and acute conditions are known to  elevate hsTnI results.  Refer to the "Links" section for chest pain algorithms and additional  guidance. Performed at [facility], [address] [place], [place] [address]   SARS Coronavirus 2 [facility](Hospital order, Performed in [facility] hospital lab) Nasopharyngeal Nasopharyngeal Swab     Status: None   Collection Time: 10/25/18  5:40 PM   Specimen: Nasopharyngeal Swab  Result Value Ref Range   SARS Coronavirus 2 NEGATIVE NEGATIVE    Comment: (NOTE) If result is NEGATIVE SARS-CoV-2 target nucleic acids are NOT DETECTED. The SARS-CoV-2 RNA is generally detectable in upper and lower  respiratory specimens during the acute phase of infection. The lowest  concentration of SARS-CoV-2 viral copies this assay can detect is 250  copies / mL. A negative result does not preclude SARS-CoV-2 infection  and should not be used as the sole basis for treatment or other  patient management decisions.  A negative result may occur with  improper specimen collection / handling, submission of specimen other  than nasopharyngeal swab, presence of viral mutation(s) within the  areas targeted by this assay, and inadequate number of viral copies  (<250 copies / mL). A negative result must be combined with clinical  observations, patient history, and epidemiological information. If result is POSITIVE SARS-CoV-2 target nucleic acids are DETECTED. The SARS-CoV-2 RNA is generally detectable in upper and lower  respiratory specimens dur ing the acute phase of infection.  Positive  results are indicative of active infection with SARS-CoV-2.  Clinical  correlation with patient history and other diagnostic information is  necessary to determine patient infection status.  Positive results do  not rule out bacterial infection or co-infection with other viruses. If result is PRESUMPTIVE POSTIVE SARS-CoV-2 nucleic acids MAY BE PRESENT.   A presumptive positive result was obtained on the submitted specimen  and  confirmed on repeat testing.  While 2019 novel coronavirus  (SARS-CoV-2) nucleic acids may be present in the submitted sample  additional confirmatory testing may be necessary for epidemiological  and / or clinical management purposes  to differentiate between  SARS-CoV-2 and other Sarbecovirus currently known to infect humans.  If clinically indicated additional testing with an alternate test  methodology [phone] is advised. The SARS-CoV-2 RNA is generally  detectable in upper and lower respiratory sp ecimens during the acute  phase of infection. The expected result is Negative. Fact Sheet for Patients:  BoilerBrush.com.cyhttps://www.fda.gov/media/136312/download Fact Sheet for Healthcare Providers: https://pope.com/https://www.fda.gov/media/136313/download This test  is not yet approved or cleared by the [place] and has been authorized for detection and/or diagnosis of SARS-CoV-2 by FDA under an Emergency Use Authorization (EUA).  This EUA will remain in effect (meaning this test can be used) for the duration of the COVID-19 declaration under Section 564(b)(1) of the Act, 21 U.S.C. section 360bbb-3(b)(1), unless the authorization is terminated or revoked sooner. Performed at [facility], [address] [place], [place] [address]   Glucose, capillary     Status: None   Collection Time: 10/25/18  9:24 PM  Result Value Ref Range   Glucose-Capillary 98 70 - 99 mg/dL   Comment 1 Notify RN    Comment 2 Document in Chart   Basic metabolic panel     Status: Abnormal   Collection Time: 10/25/18 11:14 PM  Result Value Ref Range   Sodium 139 135 - 145 mmol/L   Potassium 4.0 3.5 - 5.1 mmol/L   Chloride 106 98 - 111 mmol/L   CO2 22 22 - 32 mmol/L   Glucose, Bld 101 (H) 70 - 99 mg/dL   BUN 13 6 - 20 mg/dL   Creatinine, Ser 1.49 0.61 - 1.24 mg/dL   Calcium 9.0 8.9 - 70.2 mg/dL   GFR calc non Af Amer >60 >60 mL/min   GFR calc Af Amer >60 >60 mL/min   Anion gap 11 5 - 15    Comment: Performed at [facility], [address] [place], [place] [address]  MRSA PCR Screening     Status: None   Collection Time: 10/26/18 12:01 AM   Specimen: Nasal Mucosa; Nasopharyngeal  Result Value Ref Range   MRSA by PCR NEGATIVE NEGATIVE    Comment:        The GeneXpert MRSA Assay (FDA approved for NASAL specimens only), is one component of a comprehensive MRSA colonization surveillance program. It is not intended to diagnose MRSA infection nor to guide or monitor treatment for MRSA infections. Performed at [facility], [address] [place], [place] [address]     ABGS No results for input(s): PHART, PO2ART, TCO2, HCO3 in the last 72 hours.  Invalid input(s): PCO2 CULTURES Recent Results (from the past 240 hour(s))  SARS Coronavirus 2 [facility] order, Performed in [facility] hospital lab) Nasopharyngeal Nasopharyngeal Swab     Status: None   Collection Time: 10/25/18  5:40 PM   Specimen: Nasopharyngeal Swab  Result Value Ref Range Status   SARS Coronavirus 2 NEGATIVE NEGATIVE Final    Comment: (NOTE) If result is NEGATIVE SARS-CoV-2 target nucleic acids are NOT DETECTED. The SARS-CoV-2 RNA is generally detectable in upper and lower  respiratory specimens during the acute phase of infection. The lowest  concentration of SARS-CoV-2 viral copies this assay can detect is 250  copies / mL. A negative result does not preclude SARS-CoV-2 infection  and should not be used as the sole basis for treatment or other  patient management decisions.  A negative result may occur with  improper specimen collection / handling, submission of specimen other  than nasopharyngeal swab, presence of viral mutation(s) within the  areas targeted by this assay, and inadequate number of viral copies  (<250 copies / mL). A negative result must be combined with clinical  observations, patient history, and epidemiological information. If result is POSITIVE SARS-CoV-2 target nucleic acids are DETECTED. The SARS-CoV-2  RNA is generally detectable in upper and lower  respiratory specimens dur ing the acute phase of infection.  Positive  results are indicative of active infection with SARS-CoV-2.  Clinical  correlation with patient history and other diagnostic information is  necessary to determine patient infection status.  Positive results do  not rule out bacterial infection or co-infection with other viruses. If result is PRESUMPTIVE POSTIVE SARS-CoV-2 nucleic acids MAY BE PRESENT.   A presumptive positive result was obtained on the submitted specimen  and confirmed on repeat testing.  While 2019 novel coronavirus  (SARS-CoV-2) nucleic acids may be present in the submitted sample  additional confirmatory testing may be necessary for epidemiological  and / or clinical management purposes  to differentiate between  SARS-CoV-2 and other Sarbecovirus currently known to infect humans.  If clinically indicated additional testing with an alternate test  methodology [phone]) is advised. The SARS-CoV-2 RNA is generally  detectable in upper and lower respiratory sp ecimens during the acute  phase of infection. The expected result is Negative. Fact Sheet for Patients:  BoilerBrush.com.cy Fact Sheet for Healthcare Providers: https://pope.com/ This test is not yet approved or cleared by the [place] FDA and has been authorized for detection and/or diagnosis of SARS-CoV-2 by FDA under an Emergency Use Authorization (EUA).  This EUA will remain in effect (meaning this test can be used) for the duration of the COVID-19 declaration under Section 564(b)(1) of the Act, 21 U.S.C. section 360bbb-3(b)(1), unless the authorization is terminated or revoked sooner. Performed at [facility], [address], [place], [place] [address]   MRSA PCR Screening     Status: None   Collection Time: 10/26/18 12:01 AM   Specimen: Nasal Mucosa; Nasopharyngeal  Result Value Ref  Range Status   MRSA by PCR NEGATIVE NEGATIVE Final    Comment:        The GeneXpert MRSA Assay (FDA approved for NASAL specimens only), is one component of a comprehensive MRSA colonization surveillance program. It is not intended to diagnose MRSA infection nor to guide or monitor treatment for MRSA infections. Performed at [facility], [address], [place], [place] [address]    Studies/Results: Dg Chest 2 View  Result Date: 10/25/2018 CLINICAL DATA:  syncope episode today. Pt states "It's been a rough two months". Pt states he has been dizzy and is c/o chest pain onset for several weeks. During 2 view CXR pt stated he was getting dizzy and suddenly slumped over in the stretcher. Pt woke and stated he "felt dizzy and passed out EXAM: CHEST - 2 VIEW COMPARISON:  02/07/2018 FINDINGS: Lungs are clear. Heart size and mediastinal contours are within normal limits. No effusion. Visualized bones unremarkable. IMPRESSION: No acute cardiopulmonary disease. Electronically Signed   By: Corlis Leak M.D.   On: 10/25/2018 14:40   Ct Head Wo Contrast  Result Date: 10/25/2018 CLINICAL DATA:  Patient with seizure. EXAM: CT HEAD WITHOUT CONTRAST TECHNIQUE: Contiguous axial images were obtained from the base of the skull through the vertex without intravenous contrast. COMPARISON:  Earlier same day FINDINGS: Brain: Ventricles and sulci are appropriate for patient's age. No evidence for acute cortically based infarct, intracranial hemorrhage, mass lesion or mass-effect. Vascular: Unremarkable Skull: Intact. Sinuses/Orbits: Mild mucosal thickening right maxillary sinus. Orbits unremarkable. Other: None. IMPRESSION: No acute intracranial process. Electronically Signed   By: Annia Belt M.D.   On: 10/25/2018 21:25   Ct Head Wo Contrast  Result Date: 10/25/2018 CLINICAL DATA:  Syncope, fell EXAM: CT HEAD WITHOUT CONTRAST TECHNIQUE: Contiguous axial images were obtained from the base of the skull through the  vertex without intravenous  contrast. COMPARISON:  08/20/2016 and previous FINDINGS: Brain: No evidence of acute infarction, hemorrhage, hydrocephalus, extra-axial collection or mass lesion/mass effect. Vascular: No hyperdense vessel or unexpected calcification. Skull: Normal. Negative for fracture or focal lesion. Sinuses/Orbits: No acute finding. Other: None IMPRESSION: Negative for bleed or other acute intracranial process. Electronically Signed   By: Lucrezia Europe M.D.   On: 10/25/2018 14:50    Medications:  Prior to Admission:  Medications Prior to Admission  Medication Sig Dispense Refill Last Dose  . albuterol (PROVENTIL HFA;VENTOLIN HFA) 108 (90 BASE) MCG/ACT inhaler Inhale 2 puffs into the lungs every 6 (six) hours as needed. For shortness of breath   Past Week at Unknown time  . ALPRAZolam (XANAX) 1 MG tablet Take 1 mg by mouth 3 (three) times daily as needed for anxiety.   10/24/2018 at Unknown time  . divalproex (DEPAKOTE ER) 500 MG 24 hr tablet Take 1 tablet (500 mg total) by mouth 3 (three) times daily. 90 tablet 0 10/24/2018 at 1900  . oxyCODONE-acetaminophen (PERCOCET) 10-325 MG tablet Take 1 tablet by mouth 4 (four) times daily as needed for pain.    10/24/2018 at Unknown time  . topiramate (TOPAMAX) 50 MG tablet Take 50 mg by mouth at bedtime.   10/24/2018 at Unknown time   Scheduled: . divalproex  500 mg Oral TID  . enoxaparin (LOVENOX) injection  0.5 mg/kg Subcutaneous Q24H  . influenza vac split quadrivalent PF  0.5 mL Intramuscular Tomorrow-1000  . sodium chloride flush  3 mL Intravenous Q12H  . topiramate  50 mg Oral QHS   Continuous: . levETIRAcetam 1,000 mg (10/25/18 2245)   WUX:[id number], ALPRAZolam, ondansetron **OR** ondansetron (ZOFRAN) IV, oxyCODONE-acetaminophen **AND** oxyCODONE  Assesment: He had syncope that I think was likely related to seizure disorder.  He had a seizure here and received a bolus of Keppra and he has not had any since  He has previous head  injury which is the source of his seizure disorder   Active Problems:   COPD (chronic obstructive pulmonary disease) (HCC)   Seizures (HCC)   Syncope and collapse    Plan: No change in treatments.  Observe 1 more day to make sure seizures are controlled    LOS: 0 days   [name] 10/26/2018, 8:15 AM

## 2018-10-27 DIAGNOSIS — R079 Chest pain, unspecified: Secondary | ICD-10-CM | POA: Diagnosis present

## 2018-10-27 LAB — ECHOCARDIOGRAM COMPLETE
Height: 73 in
Weight: 4000 oz

## 2018-10-27 LAB — GLUCOSE, CAPILLARY: Glucose-Capillary: 76 mg/dL (ref 70–99)

## 2018-10-27 MED ORDER — DIVALPROEX SODIUM ER 500 MG PO TB24: 1000.0000 mg | ORAL_TABLET | Freq: Three times a day (TID) | ORAL | 0 refills | Status: DC

## 2018-10-27 NOTE — Discharge Summary (Signed)
Physician Discharge Summary  Patient ID: LELDON NORMIL MRN: [id number] DOB/AGE: 53/19/1967 53 y.o. Primary Care Physician:Sarahi Borland, Ramon Dredge, MD Admit date: 10/25/2018 Discharge date: 10/27/2018    Discharge Diagnoses:   Active Problems:   COPD (chronic obstructive pulmonary disease) (HCC)   History of traumatic brain injury   Seizures (HCC)   Syncope and collapse   Chest pain  Chronic pain Anxiety Allergies as of 10/27/2018   No Known Allergies     Medication List    TAKE these medications   albuterol 108 (90 Base) MCG/ACT inhaler Commonly known as: VENTOLIN HFA Inhale 2 puffs into the lungs every 6 (six) hours as needed. For shortness of breath   ALPRAZolam 1 MG tablet Commonly known as: XANAX Take 1 mg by mouth 3 (three) times daily as needed for anxiety.   divalproex 500 MG 24 hr tablet Commonly known as: DEPAKOTE ER Take 2 tablets (1,000 mg total) by mouth 3 (three) times daily. What changed: how much to take   oxyCODONE-acetaminophen 10-325 MG tablet Commonly known as: PERCOCET Take 1 tablet by mouth 4 (four) times daily as needed for pain.   topiramate 50 MG tablet Commonly known as: TOPAMAX Take 50 mg by mouth at bedtime.       Discharged Condition: Improved    Consults: None  Significant Diagnostic Studies: Dg Chest 2 View  Result Date: 10/25/2018 CLINICAL DATA:  syncope episode today. Pt states "It's been a rough two months". Pt states he has been dizzy and is c/o chest pain onset for several weeks. During 2 view CXR pt stated he was getting dizzy and suddenly slumped over in the stretcher. Pt woke and stated he "felt dizzy and passed out EXAM: CHEST - 2 VIEW COMPARISON:  02/07/2018 FINDINGS: Lungs are clear. Heart size and mediastinal contours are within normal limits. No effusion. Visualized bones unremarkable. IMPRESSION: No acute cardiopulmonary disease. Electronically Signed   By: Corlis Leak M.D.   On: 10/25/2018 14:40   Ct Head Wo  Contrast  Result Date: 10/25/2018 CLINICAL DATA:  Patient with seizure. EXAM: CT HEAD WITHOUT CONTRAST TECHNIQUE: Contiguous axial images were obtained from the base of the skull through the vertex without intravenous contrast. COMPARISON:  Earlier same day FINDINGS: Brain: Ventricles and sulci are appropriate for patient's age. No evidence for acute cortically based infarct, intracranial hemorrhage, mass lesion or mass-effect. Vascular: Unremarkable Skull: Intact. Sinuses/Orbits: Mild mucosal thickening right maxillary sinus. Orbits unremarkable. Other: None. IMPRESSION: No acute intracranial process. Electronically Signed   By: Annia Belt M.D.   On: 10/25/2018 21:25   Ct Head Wo Contrast  Result Date: 10/25/2018 CLINICAL DATA:  Syncope, fell EXAM: CT HEAD WITHOUT CONTRAST TECHNIQUE: Contiguous axial images were obtained from the base of the skull through the vertex without intravenous contrast. COMPARISON:  08/20/2016 and previous FINDINGS: Brain: No evidence of acute infarction, hemorrhage, hydrocephalus, extra-axial collection or mass lesion/mass effect. Vascular: No hyperdense vessel or unexpected calcification. Skull: Normal. Negative for fracture or focal lesion. Sinuses/Orbits: No acute finding. Other: None IMPRESSION: Negative for bleed or other acute intracranial process. Electronically Signed   By: Corlis Leak M.D.   On: 10/25/2018 14:50    Lab Results: Basic Metabolic Panel: Recent Labs    10/25/18 1311 10/25/18 2314  NA 138 139  K 3.9 4.0  CL 109 106  CO2 19* 22  GLUCOSE 139* 101*  BUN 10 13  CREATININE 0.97 1.10  CALCIUM 9.0 9.0   Liver Function Tests: No results for input(s):  AST, ALT, ALKPHOS, BILITOT, PROT, ALBUMIN in the last 72 hours.   CBC: Recent Labs    10/25/18 1311  WBC 8.0  HGB 15.9  HCT 47.5  MCV 93.3  PLT 241    Recent Results (from the past 240 hour(s))  SARS Coronavirus 2 [facility] order, Performed in [facility] hospital lab) Nasopharyngeal  Nasopharyngeal Swab     Status: None   Collection Time: 10/25/18  5:40 PM   Specimen: Nasopharyngeal Swab  Result Value Ref Range Status   SARS Coronavirus 2 NEGATIVE NEGATIVE Final    Comment: (NOTE) If result is NEGATIVE SARS-CoV-2 target nucleic acids are NOT DETECTED. The SARS-CoV-2 RNA is generally detectable in upper and lower  respiratory specimens during the acute phase of infection. The lowest  concentration of SARS-CoV-2 viral copies this assay can detect is 250  copies / mL. A negative result does not preclude SARS-CoV-2 infection  and should not be used as the sole basis for treatment or other  patient management decisions.  A negative result may occur with  improper specimen collection / handling, submission of specimen other  than nasopharyngeal swab, presence of viral mutation(s) within the  areas targeted by this assay, and inadequate number of viral copies  (<250 copies / mL). A negative result must be combined with clinical  observations, patient history, and epidemiological information. If result is POSITIVE SARS-CoV-2 target nucleic acids are DETECTED. The SARS-CoV-2 RNA is generally detectable in upper and lower  respiratory specimens dur ing the acute phase of infection.  Positive  results are indicative of active infection with SARS-CoV-2.  Clinical  correlation with patient history and other diagnostic information is  necessary to determine patient infection status.  Positive results do  not rule out bacterial infection or co-infection with other viruses. If result is PRESUMPTIVE POSTIVE SARS-CoV-2 nucleic acids MAY BE PRESENT.   A presumptive positive result was obtained on the submitted specimen  and confirmed on repeat testing.  While 2019 novel coronavirus  (SARS-CoV-2) nucleic acids may be present in the submitted sample  additional confirmatory testing may be necessary for epidemiological  and / or clinical management purposes  to differentiate between   SARS-CoV-2 and other Sarbecovirus currently known to infect humans.  If clinically indicated additional testing with an alternate test  methodology [phone]) is advised. The SARS-CoV-2 RNA is generally  detectable in upper and lower respiratory sp ecimens during the acute  phase of infection. The expected result is Negative. Fact Sheet for Patients:  StrictlyIdeas.no Fact Sheet for Healthcare Providers: BankingDealers.co.za This test is not yet approved or cleared by the [place] FDA and has been authorized for detection and/or diagnosis of SARS-CoV-2 by FDA under an Emergency Use Authorization (EUA).  This EUA will remain in effect (meaning this test can be used) for the duration of the COVID-19 declaration under Section 564(b)(1) of the Act, 21 U.S.C. section 360bbb-3(b)(1), unless the authorization is terminated or revoked sooner. Performed at [facility], [address], [place], [place] [address]   MRSA PCR Screening     Status: None   Collection Time: 10/26/18 12:01 AM   Specimen: Nasal Mucosa; Nasopharyngeal  Result Value Ref Range Status   MRSA by PCR NEGATIVE NEGATIVE Final    Comment:        The GeneXpert MRSA Assay (FDA approved for NASAL specimens only), is one component of a comprehensive MRSA colonization surveillance program. It is not intended to diagnose MRSA infection nor to guide or monitor treatment  for MRSA infections. Performed at [facility], [address] [place], [place] [address]      Hospital Course: This is a 53 year old who [facility] because he had a syncopal episode.  He has seizure disorder and is not totally clear if he had a seizure or not.  He complained of chest pain after EMS was called and was brought to the emergency department.  He ruled out for acute coronary syndrome.  After he was taken to the floor from the emergency department he had a seizure and fell out of bed and  struck his head.  He had CT scan of his head that was negative for any sort of acute injury.  His chest pain resolved.  He had no further seizures.  Discharge Exam: Blood pressure 104/65, pulse (!) 58, temperature 97.6 F (36.4 C), temperature source Oral, resp. rate 18, height 6\' 1"  (1.854 m), weight 105.3 kg, SpO2 99 %. He is awake and alert.  Chest is clear.  Heart is regular.  Disposition: Back to jail.  I increased his divalproex to 1000 mg 3 times daily since he had breakthrough seizure on his previous 500 mg 3 times daily.  He is still listed as being on oxycodone and Xanax and I am not sure that he is actually getting those at the jail.      Signed: Fredirick MaudlinEdward L Minnie Shi   10/27/2018, 8:18 AM

## 2018-10-27 NOTE — Progress Notes (Signed)
Nsg Discharge Note  Admit Date:  10/25/2018 Discharge date: 10/27/2018   [name] to be D/C'd back [place] custody per MD order.  AVS completed.  Copy for chart, and copy for patient signed, and dated. Patient/caregiver able to verbalize understanding.  Discharge Medication: Allergies as of 10/27/2018   No Known Allergies     Medication List    TAKE these medications   albuterol 108 (90 Base) MCG/ACT inhaler Commonly known as: VENTOLIN HFA Inhale 2 puffs into the lungs every 6 (six) hours as needed. For shortness of breath   ALPRAZolam 1 MG tablet Commonly known as: XANAX Take 1 mg by mouth 3 (three) times daily as needed for anxiety.   divalproex 500 MG 24 hr tablet Commonly known as: DEPAKOTE ER Take 2 tablets (1,000 mg total) by mouth 3 (three) times daily. What changed: how much to take   oxyCODONE-acetaminophen 10-325 MG tablet Commonly known as: PERCOCET Take 1 tablet by mouth 4 (four) times daily as needed for pain.   topiramate 50 MG tablet Commonly known as: TOPAMAX Take 50 mg by mouth at bedtime.       Discharge Assessment: Vitals:   10/27/18 0420 10/27/18 0804  BP: 104/65   Pulse: (!) 58   Resp:    Temp: 97.6 F (36.4 C)   SpO2: 100% 99%   Skin clean, dry and intact without evidence of skin break down, no evidence of skin tears noted. IV catheter discontinued intact. Site without signs and symptoms of complications - no redness or edema noted at insertion site, patient denies c/o pain - only slight tenderness at site.  Dressing with slight pressure applied.  D/c Instructions-Education: Discharge instructions given to patient with verbalized understanding. D/c education completed with patient including follow up instructions, medication list, d/c activities limitations if indicated, with other d/c instructions as indicated by MD - patient able to verbalize understanding, all questions fully answered. Patient instructed  to return to ED, call 911, or call MD for any changes in condition.  Patient escorted via [facility], and D/C to [facility] department.  Clifford Mate, RN 10/27/2018 1:29 PM

## 2018-10-27 NOTE — Progress Notes (Signed)
Subjective: He feels okay.  No chest pain.  No seizures.  No other new complaints.  Echocardiogram has been done and is pending.  Objective: Vital signs in last 24 hours: Temp:  [97.6 F (36.4 C)-99 F (37.2 C)] 97.6 F (36.4 C) (09/14 0420) Pulse Rate:  [56-65] 58 (09/14 0420) Resp:  [18] 18 (09/13 1425) BP: (98-107)/(52-67) 104/65 (09/14 0420) SpO2:  [99 %-100 %] 99 % (09/14 0804) Weight:  [105.3 kg] 105.3 kg (09/14 0500) Weight change: -8.099 kg Last BM Date: 10/25/18  Intake/Output from previous day: 09/13 0701 - 09/14 0700 In: 720 [P.O.:720] Out: -   PHYSICAL EXAM General appearance: alert, cooperative and no distress Resp: clear to auscultation bilaterally Cardio: regular rate and rhythm, S1, S2 normal, no murmur, click, rub or gallop GI: soft, non-tender; bowel sounds normal; no masses,  no organomegaly Extremities: extremities normal, atraumatic, no cyanosis or edema  Lab Results:  Results for orders placed or performed during the hospital encounter of 10/25/18 (from the past 48 hour(s))  CBG monitoring, ED     Status: Abnormal   Collection Time: 10/25/18  1:06 PM  Result Value Ref Range   Glucose-Capillary 145 (H) 70 - 99 mg/dL  Basic metabolic panel     Status: Abnormal   Collection Time: 10/25/18  1:11 PM  Result Value Ref Range   Sodium 138 135 - 145 mmol/L   Potassium 3.9 3.5 - 5.1 mmol/L   Chloride 109 98 - 111 mmol/L   CO2 19 (L) 22 - 32 mmol/L   Glucose, Bld 139 (H) 70 - 99 mg/dL   BUN 10 6 - 20 mg/dL   Creatinine, Ser 0.97 0.61 - 1.24 mg/dL   Calcium 9.0 8.9 - 10.3 mg/dL   GFR calc non Af Amer >60 >60 mL/min   GFR calc Af Amer >60 >60 mL/min   Anion gap 10 5 - 15    Comment: Performed at [facility], [address] [place], [place] [address]  CBC     Status: None   Collection Time: 10/25/18  1:11 PM  Result Value Ref Range   WBC 8.0 4.0 - 10.5 K/uL   RBC 5.09 4.22 - 5.81 MIL/uL   Hemoglobin 15.9 13.0 - 17.0 g/dL   HCT 47.5 39.0 - 52.0 %    MCV 93.3 80.0 - 100.0 fL   MCH 31.2 26.0 - 34.0 pg   MCHC 33.5 30.0 - 36.0 g/dL   RDW 13.3 11.5 - 15.5 %   Platelets 241 150 - 400 K/uL   nRBC 0.0 0.0 - 0.2 %    Comment: Performed at [facility], [address] [place], [place] [address]  Troponin I (High Sensitivity)     Status: None   Collection Time: 10/25/18  1:11 PM  Result Value Ref Range   Troponin I (High Sensitivity) 5 <18 ng/L    Comment: (NOTE) Elevated high sensitivity troponin I (hsTnI) values and significant  changes across serial measurements may suggest ACS but many other  chronic and acute conditions are known to elevate hsTnI results.  Refer to the "Links" section for chest pain algorithms and additional  guidance. Performed at [facility], [address], [place], [place] [address]   Valproic acid level     Status: None   Collection Time: 10/25/18  1:11 PM  Result Value Ref Range   Valproic Acid Lvl 51 50.0 - 100.0 ug/mL    Comment: Performed at [facility], [address] [place], [place] [address]  TSH     Status: None   Collection Time: 10/25/18  1:11 PM  Result Value Ref Range   TSH 0.665 0.350 - 4.500 uIU/mL    Comment: Performed by a 3rd Generation assay with a functional sensitivity of <=0.01 uIU/mL. Performed at [facility], [address] [place], [place] [address]   Troponin I (High Sensitivity)     Status: None   Collection Time: 10/25/18  3:20 PM  Result Value Ref Range   Troponin I (High Sensitivity) 5 <18 ng/L    Comment: (NOTE) Elevated high sensitivity troponin I (hsTnI) values and significant  changes across serial measurements may suggest ACS but many other  chronic and acute conditions are known to elevate hsTnI results.  Refer to the "Links" section for chest pain algorithms and additional  guidance. Performed at [facility], [address] [place], [place] [address]   SARS Coronavirus 2 [facility](Hospital order, Performed in [facility] hospital lab) Nasopharyngeal Nasopharyngeal Swab      Status: None   Collection Time: 10/25/18  5:40 PM   Specimen: Nasopharyngeal Swab  Result Value Ref Range   SARS Coronavirus 2 NEGATIVE NEGATIVE    Comment: (NOTE) If result is NEGATIVE SARS-CoV-2 target nucleic acids are NOT DETECTED. The SARS-CoV-2 RNA is generally detectable in upper and lower  respiratory specimens during the acute phase of infection. The lowest  concentration of SARS-CoV-2 viral copies this assay can detect is 250  copies / mL. A negative result does not preclude SARS-CoV-2 infection  and should not be used as the sole basis for treatment or other  patient management decisions.  A negative result may occur with  improper specimen collection / handling, submission of specimen other  than nasopharyngeal swab, presence of viral mutation(s) within the  areas targeted by this assay, and inadequate number of viral copies  (<250 copies / mL). A negative result must be combined with clinical  observations, patient history, and epidemiological information. If result is POSITIVE SARS-CoV-2 target nucleic acids are DETECTED. The SARS-CoV-2 RNA is generally detectable in upper and lower  respiratory specimens dur ing the acute phase of infection.  Positive  results are indicative of active infection with SARS-CoV-2.  Clinical  correlation with patient history and other diagnostic information is  necessary to determine patient infection status.  Positive results do  not rule out bacterial infection or co-infection with other viruses. If result is PRESUMPTIVE POSTIVE SARS-CoV-2 nucleic acids MAY BE PRESENT.   A presumptive positive result was obtained on the submitted specimen  and confirmed on repeat testing.  While 2019 novel coronavirus  (SARS-CoV-2) nucleic acids may be present in the submitted sample  additional confirmatory testing may be necessary for epidemiological  and / or clinical management purposes  to differentiate between  SARS-CoV-2 and other Sarbecovirus  currently known to infect humans.  If clinically indicated additional testing with an alternate test  methodology [phone]([id number]) is advised. The SARS-CoV-2 RNA is generally  detectable in upper and lower respiratory sp ecimens during the acute  phase of infection. The expected result is Negative. Fact Sheet for Patients:  BoilerBrush.com.cyhttps://www.fda.gov/media/136312/download Fact Sheet for Healthcare Providers: https://pope.com/https://www.fda.gov/media/136313/download This test is not yet approved or cleared by the [place] FDA and has been authorized for detection and/or diagnosis of SARS-CoV-2 by FDA under an Emergency Use Authorization (EUA).  This EUA will remain in effect (meaning this test can be used) for the duration of the COVID-19 declaration under Section 564(b)(1) of the Act, 21 U.S.C.  section 360bbb-3(b)(1), unless the authorization is terminated or revoked sooner. Performed at [facility], [address], [place], [place] [address]   Glucose, capillary     Status: None   Collection Time: 10/25/18  9:24 PM  Result Value Ref Range   Glucose-Capillary 98 70 - 99 mg/dL   Comment 1 Notify RN    Comment 2 Document in Chart   Basic metabolic panel     Status: Abnormal   Collection Time: 10/25/18 11:14 PM  Result Value Ref Range   Sodium 139 135 - 145 mmol/L   Potassium 4.0 3.5 - 5.1 mmol/L   Chloride 106 98 - 111 mmol/L   CO2 22 22 - 32 mmol/L   Glucose, Bld 101 (H) 70 - 99 mg/dL   BUN 13 6 - 20 mg/dL   Creatinine, Ser 6.04 0.61 - 1.24 mg/dL   Calcium 9.0 8.9 - 54.0 mg/dL   GFR calc non Af Amer >60 >60 mL/min   GFR calc Af Amer >60 >60 mL/min   Anion gap 11 5 - 15    Comment: Performed at [facility], [address] [place], [place] [address]  MRSA PCR Screening     Status: None   Collection Time: 10/26/18 12:01 AM   Specimen: Nasal Mucosa; Nasopharyngeal  Result Value Ref Range   MRSA by PCR NEGATIVE NEGATIVE    Comment:        The GeneXpert MRSA Assay (FDA approved for NASAL  specimens only), is one component of a comprehensive MRSA colonization surveillance program. It is not intended to diagnose MRSA infection nor to guide or monitor treatment for MRSA infections. Performed at [facility], [address] [place], [place] [address]   Glucose, capillary     Status: None   Collection Time: 10/27/18  7:42 AM  Result Value Ref Range   Glucose-Capillary 76 70 - 99 mg/dL    ABGS No results for input(s): PHART, PO2ART, TCO2, HCO3 in the last 72 hours.  Invalid input(s): PCO2 CULTURES Recent Results (from the past 240 hour(s))  SARS Coronavirus 2 [facility] order, Performed in [facility] hospital lab) Nasopharyngeal Nasopharyngeal Swab     Status: None   Collection Time: 10/25/18  5:40 PM   Specimen: Nasopharyngeal Swab  Result Value Ref Range Status   SARS Coronavirus 2 NEGATIVE NEGATIVE Final    Comment: (NOTE) If result is NEGATIVE SARS-CoV-2 target nucleic acids are NOT DETECTED. The SARS-CoV-2 RNA is generally detectable in upper and lower  respiratory specimens during the acute phase of infection. The lowest  concentration of SARS-CoV-2 viral copies this assay can detect is 250  copies / mL. A negative result does not preclude SARS-CoV-2 infection  and should not be used as the sole basis for treatment or other  patient management decisions.  A negative result may occur with  improper specimen collection / handling, submission of specimen other  than nasopharyngeal swab, presence of viral mutation(s) within the  areas targeted by this assay, and inadequate number of viral copies  (<250 copies / mL). A negative result must be combined with clinical  observations, patient history, and epidemiological information. If result is POSITIVE SARS-CoV-2 target nucleic acids are DETECTED. The SARS-CoV-2 RNA is generally detectable in upper and lower  respiratory specimens dur ing the acute phase of infection.  Positive  results are indicative of active  infection with SARS-CoV-2.  Clinical  correlation with patient history and other diagnostic information is  necessary to determine patient infection status.  Positive results do  not rule out bacterial infection or co-infection with other viruses. If result is PRESUMPTIVE POSTIVE SARS-CoV-2 nucleic acids MAY BE PRESENT.   A presumptive positive result was obtained on the submitted specimen  and confirmed on repeat testing.  While 2019 novel coronavirus  (SARS-CoV-2) nucleic acids may be present in the submitted sample  additional confirmatory testing may be necessary for epidemiological  and / or clinical management purposes  to differentiate between  SARS-CoV-2 and other Sarbecovirus currently known to infect humans.  If clinically indicated additional testing with an alternate test  methodology [phone]([id number]) is advised. The SARS-CoV-2 RNA is generally  detectable in upper and lower respiratory sp ecimens during the acute  phase of infection. The expected result is Negative. Fact Sheet for Patients:  BoilerBrush.com.cyhttps://www.fda.gov/media/136312/download Fact Sheet for Healthcare Providers: https://pope.com/https://www.fda.gov/media/136313/download This test is not yet approved or cleared by the [place] FDA and has been authorized for detection and/or diagnosis of SARS-CoV-2 by FDA under an Emergency Use Authorization (EUA).  This EUA will remain in effect (meaning this test can be used) for the duration of the COVID-19 declaration under Section 564(b)(1) of the Act, 21 U.S.C. section 360bbb-3(b)(1), unless the authorization is terminated or revoked sooner. Performed at [facility], [address] [place], [place] [address]   MRSA PCR Screening     Status: None   Collection Time: 10/26/18 12:01 AM   Specimen: Nasal Mucosa; Nasopharyngeal  Result Value Ref Range Status   MRSA by PCR NEGATIVE NEGATIVE Final    Comment:        The GeneXpert MRSA Assay (FDA approved for NASAL specimens only), is one  component of a comprehensive MRSA colonization surveillance program. It is not intended to diagnose MRSA infection nor to guide or monitor treatment for MRSA infections. Performed at [facility], [address] [place], [place] [address]    Studies/Results: Dg Chest 2 View  Result Date: 10/25/2018 CLINICAL DATA:  syncope episode today. Pt states "It's been a rough two months". Pt states he has been dizzy and is c/o chest pain onset for several weeks. During 2 view CXR pt stated he was getting dizzy and suddenly slumped over in the stretcher. Pt woke and stated he "felt dizzy and passed out EXAM: CHEST - 2 VIEW COMPARISON:  02/07/2018 FINDINGS: Lungs are clear. Heart size and mediastinal contours are within normal limits. No effusion. Visualized bones unremarkable. IMPRESSION: No acute cardiopulmonary disease. Electronically Signed   By: Corlis Leak  Hassell M.D.   On: 10/25/2018 14:40   Ct Head Wo Contrast  Result Date: 10/25/2018 CLINICAL DATA:  Patient with seizure. EXAM: CT HEAD WITHOUT CONTRAST TECHNIQUE: Contiguous axial images were obtained from the base of the skull through the vertex without intravenous contrast. COMPARISON:  Earlier same day FINDINGS: Brain: Ventricles and sulci are appropriate for patient's age. No evidence for acute cortically based infarct, intracranial hemorrhage, mass lesion or mass-effect. Vascular: Unremarkable Skull: Intact. Sinuses/Orbits: Mild mucosal thickening right maxillary sinus. Orbits unremarkable. Other: None. IMPRESSION: No acute intracranial process. Electronically Signed   By: Annia Beltrew  Davis M.D.   On: 10/25/2018 21:25   Ct Head Wo Contrast  Result Date: 10/25/2018 CLINICAL DATA:  Syncope, fell EXAM: CT HEAD WITHOUT CONTRAST TECHNIQUE: Contiguous axial images were obtained from the base of the skull through the vertex without intravenous contrast. COMPARISON:  08/20/2016 and previous FINDINGS: Brain: No evidence of acute infarction, hemorrhage, hydrocephalus,  extra-axial collection or mass lesion/mass effect. Vascular: No hyperdense vessel or unexpected calcification.  Skull: Normal. Negative for fracture or focal lesion. Sinuses/Orbits: No acute finding. Other: None IMPRESSION: Negative for bleed or other acute intracranial process. Electronically Signed   By: Corlis Leak M.D.   On: 10/25/2018 14:50    Medications:  Prior to Admission:  Medications Prior to Admission  Medication Sig Dispense Refill Last Dose  . albuterol (PROVENTIL HFA;VENTOLIN HFA) 108 (90 BASE) MCG/ACT inhaler Inhale 2 puffs into the lungs every 6 (six) hours as needed. For shortness of breath   Past Week at Unknown time  . ALPRAZolam (XANAX) 1 MG tablet Take 1 mg by mouth 3 (three) times daily as needed for anxiety.   10/24/2018 at Unknown time  . divalproex (DEPAKOTE ER) 500 MG 24 hr tablet Take 1 tablet (500 mg total) by mouth 3 (three) times daily. 90 tablet 0 10/24/2018 at 1900  . oxyCODONE-acetaminophen (PERCOCET) 10-325 MG tablet Take 1 tablet by mouth 4 (four) times daily as needed for pain.    10/24/2018 at Unknown time  . topiramate (TOPAMAX) 50 MG tablet Take 50 mg by mouth at bedtime.   10/24/2018 at Unknown time   Scheduled: . divalproex  500 mg Oral TID  . enoxaparin (LOVENOX) injection  0.5 mg/kg Subcutaneous Q24H  . sodium chloride flush  3 mL Intravenous Q12H  . topiramate  50 mg Oral QHS   Continuous: . levETIRAcetam 1,000 mg (10/27/18 0804)   GGE:ZMOQHUTML, ALPRAZolam, ondansetron **OR** ondansetron (ZOFRAN) IV, oxyCODONE-acetaminophen **AND** oxyCODONE  Assesment: He came to the emergency department after a syncopal episode.  He may have had a seizure because he had a seizure here soon afterward.  His chest pain has resolved.  He has ruled out for acute coronary syndrome.  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  His dose of seizure medications has been increased.  He has COPD at baseline  which is stable Active Problems:   COPD (chronic obstructive pulmonary disease) (HCC)   Seizures (HCC)   Syncope and collapse    Plan: Likely discharge after echocardiogram report is available.  He is in jail and will return to the jail    LOS: 0 days   Fredirick Maudlin 10/27/2018, 8:12 AM

## 2018-10-28 LAB — HIV ANTIBODY (ROUTINE TESTING W REFLEX): HIV Screen 4th Generation wRfx: NONREACTIVE

## 2018-11-20 ENCOUNTER — Ambulatory Visit: Payer: Medicaid Other | Admitting: Cardiovascular Disease

## 2018-11-20 ENCOUNTER — Other Ambulatory Visit: Payer: Self-pay

## 2019-04-08 IMAGING — DX DG CHEST 2V
2 series · 2 of 2 positions shown · non-contrast
Comparison: 06/28/2016

CLINICAL DATA: Chest pain

EXAM:
CHEST - 2 VIEW

[chest lat]
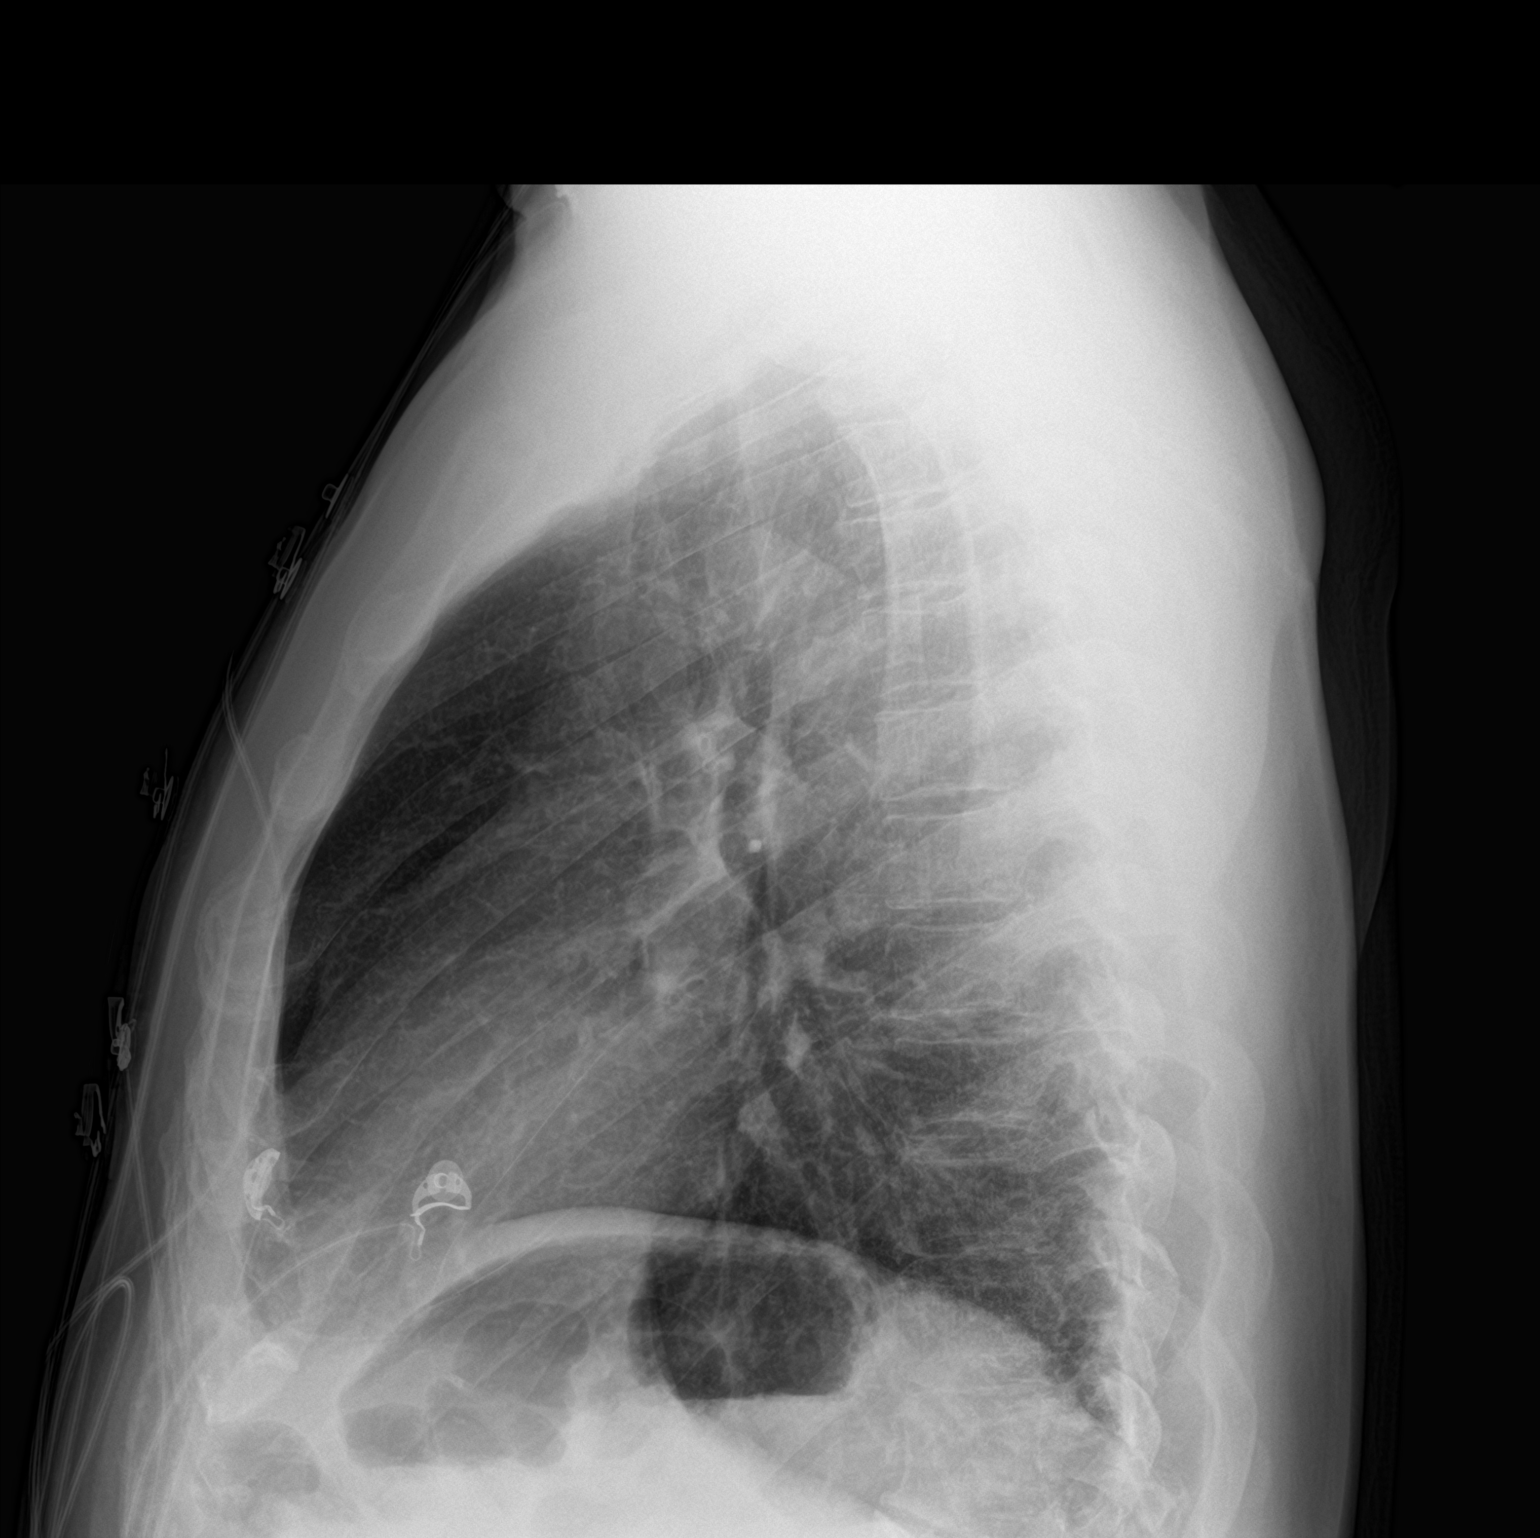

[chest pa]
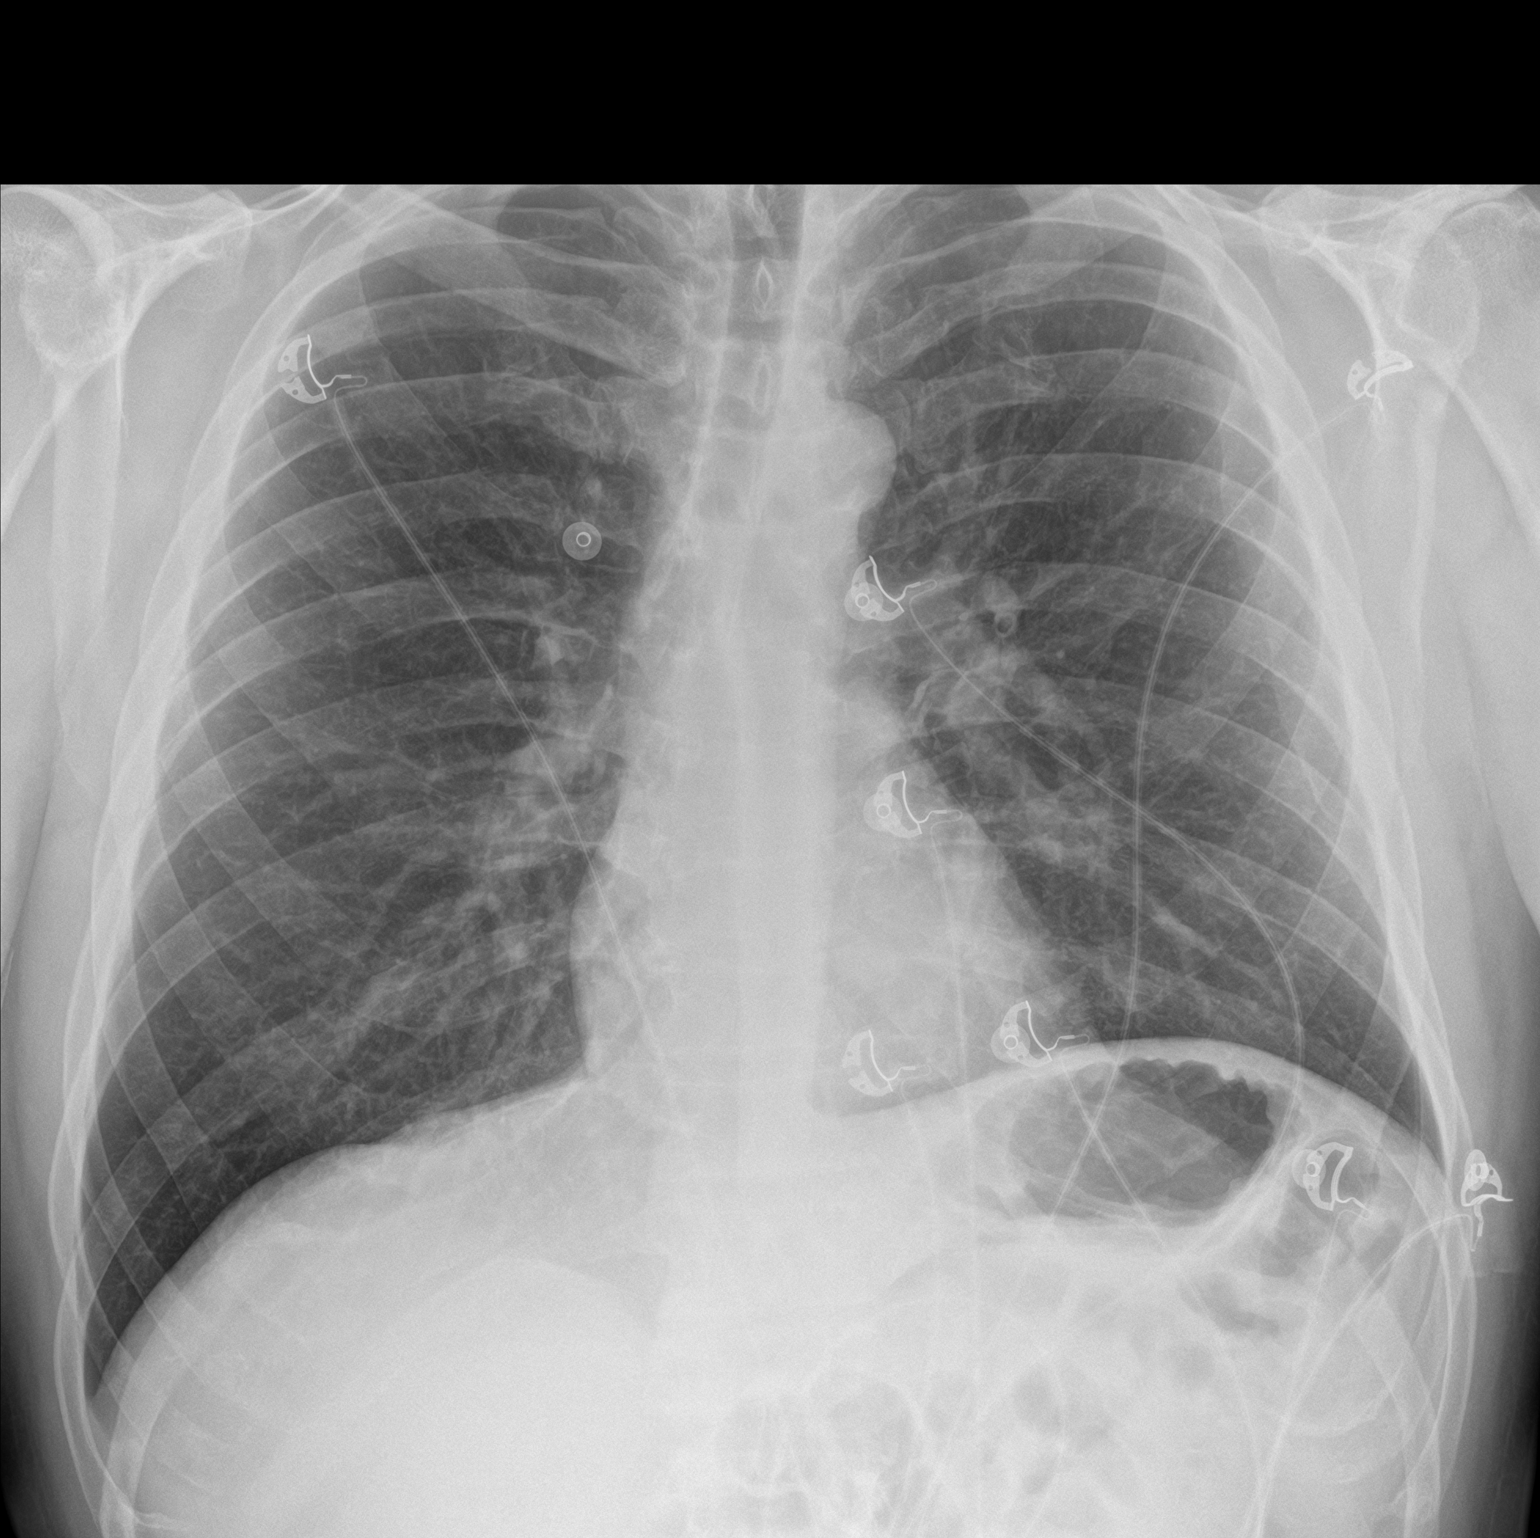

[2 of 2 positions shown; findings below may reference images not displayed]

FINDINGS: There is mild elevation of the left diaphragm. There is no focal
parenchymal opacity. There is no pleural effusion or pneumothorax.
The heart and mediastinal contours are unremarkable.

The osseous structures are unremarkable.
IMPRESSION: No active cardiopulmonary disease.

## 2021-06-21 ENCOUNTER — Encounter (HOSPITAL_COMMUNITY): Payer: Self-pay

## 2021-06-21 ENCOUNTER — Other Ambulatory Visit: Payer: Self-pay

## 2021-06-21 ENCOUNTER — Emergency Department (HOSPITAL_COMMUNITY): Payer: Medicaid Other

## 2021-06-21 ENCOUNTER — Emergency Department (HOSPITAL_COMMUNITY)
Admission: EM | Admit: 2021-06-21 | Discharge: 2021-06-21 | Disposition: A | Discharge status: Home / Self Care | Payer: Medicaid Other | Attending: Emergency Medicine | Admitting: Emergency Medicine

## 2021-06-21 DIAGNOSIS — Z23 Encounter for immunization: Secondary | ICD-10-CM | POA: Diagnosis not present

## 2021-06-21 DIAGNOSIS — S61411A Laceration without foreign body of right hand, initial encounter: Secondary | ICD-10-CM | POA: Diagnosis not present

## 2021-06-21 DIAGNOSIS — R569 Unspecified convulsions: Secondary | ICD-10-CM | POA: Insufficient documentation

## 2021-06-21 DIAGNOSIS — X58XXXA Exposure to other specified factors, initial encounter: Secondary | ICD-10-CM | POA: Insufficient documentation

## 2021-06-21 DIAGNOSIS — S6991XA Unspecified injury of right wrist, hand and finger(s), initial encounter: Secondary | ICD-10-CM | POA: Diagnosis present

## 2021-06-21 LAB — COMPREHENSIVE METABOLIC PANEL
ALT: 32 U/L (ref 0–44)
AST: 29 U/L (ref 15–41)
Albumin: 4.1 g/dL (ref 3.5–5.0)
Alkaline Phosphatase: 78 U/L (ref 38–126)
Anion gap: 13 (ref 5–15)
BUN: 12 mg/dL (ref 6–20)
CO2: 20 mmol/L — ABNORMAL LOW (ref 22–32)
Calcium: 8.6 mg/dL — ABNORMAL LOW (ref 8.9–10.3)
Chloride: 105 mmol/L (ref 98–111)
Creatinine, Ser: 0.8 mg/dL (ref 0.61–1.24)
GFR, Estimated: >60 mL/min (ref >60)
Glucose, Bld: 118 mg/dL — ABNORMAL HIGH (ref 70–99)
Potassium: 3.4 mmol/L — ABNORMAL LOW (ref 3.5–5.1)
Sodium: 138 mmol/L (ref 135–145)
Total Bilirubin: 0.5 mg/dL (ref 0.3–1.2)
Total Protein: 7.9 g/dL (ref 6.5–8.1)

## 2021-06-21 LAB — CBC WITH DIFFERENTIAL/PLATELET
Abs Immature Granulocytes: 0.02 10*3/uL (ref 0.00–0.07)
Basophils Absolute: 0 10*3/uL (ref 0.0–0.1)
Basophils Relative: 1 %
Eosinophils Absolute: 0.1 10*3/uL (ref 0.0–0.5)
Eosinophils Relative: 2 %
HCT: 45.9 % (ref 39.0–52.0)
Hemoglobin: 16.2 g/dL (ref 13.0–17.0)
Immature Granulocytes: 0 %
Lymphocytes Relative: 45 %
Lymphs Abs: 2.8 10*3/uL (ref 0.7–4.0)
MCH: 32 pg (ref 26.0–34.0)
MCHC: 35.3 g/dL (ref 30.0–36.0)
MCV: 90.5 fL (ref 80.0–100.0)
Monocytes Absolute: 0.5 10*3/uL (ref 0.1–1.0)
Monocytes Relative: 8 %
Neutro Abs: 2.6 10*3/uL (ref 1.7–7.7)
Neutrophils Relative %: 44 %
Platelets: 230 10*3/uL (ref 150–400)
RBC: 5.07 MIL/uL (ref 4.22–5.81)
RDW: 13.4 % (ref 11.5–15.5)
WBC: 6 10*3/uL (ref 4.0–10.5)
nRBC: 0 % (ref 0.0–0.2)

## 2021-06-21 LAB — ETHANOL: Alcohol, Ethyl (B): 113 mg/dL — ABNORMAL HIGH (ref <10)

## 2021-06-21 LAB — VALPROIC ACID LEVEL: Valproic Acid Lvl: <10 ug/mL — ABNORMAL LOW (ref 50.0–100.0)

## 2021-06-21 LAB — CBG MONITORING, ED: Glucose-Capillary: 119 mg/dL — ABNORMAL HIGH (ref 70–99)

## 2021-06-21 MED ORDER — AMOXICILLIN-POT CLAVULANATE 875-125 MG PO TABS: 1.0000 | ORAL_TABLET | Freq: Two times a day (BID) | ORAL | 0 refills | Status: DC

## 2021-06-21 MED ORDER — SODIUM CHLORIDE 0.9 % IV SOLN
3.0000 g | Freq: Once | INTRAVENOUS | Status: AC
  Administered 2021-06-21: 3 g via INTRAVENOUS
  Filled 2021-06-21: qty 8

## 2021-06-21 MED ORDER — LORAZEPAM 2 MG/ML IJ SOLN
2.0000 mg | Freq: Once | INTRAMUSCULAR | Status: AC
  Administered 2021-06-21: 2 mg via INTRAVENOUS
  Filled 2021-06-21: qty 1

## 2021-06-21 MED ORDER — DOXYCYCLINE HYCLATE 100 MG PO CAPS: 100.0000 mg | ORAL_CAPSULE | Freq: Two times a day (BID) | ORAL | 0 refills | Status: DC

## 2021-06-21 MED ORDER — TETANUS-DIPHTH-ACELL PERTUSSIS 5-2.5-18.5 LF-MCG/0.5 IM SUSY
0.5000 mL | PREFILLED_SYRINGE | Freq: Once | INTRAMUSCULAR | Status: AC
  Administered 2021-06-21: 0.5 mL via INTRAMUSCULAR
  Filled 2021-06-21: qty 0.5

## 2021-06-21 NOTE — ED Triage Notes (Signed)
Pt arrived by [facility] due to multiple seizures. EMS states that seizures last around one minute, was agonal for 30 seconds and started breathing on own. Pt alert on arrival.  ?

## 2021-06-21 NOTE — Discharge Instructions (Signed)
Seizure precautions: Per [place] DMV statutes, patients with seizures are not allowed to drive until they have been seizure-free for six months and cleared by a physician    Use caution when using heavy equipment or power tools. Avoid working on ladders or at heights. Take showers instead of baths. Ensure the water temperature is not too high on the home water heater. Do not go swimming alone. Do not lock yourself in a room alone (i.e. bathroom). When caring for infants or small children, sit down when holding, feeding, or changing them to minimize risk of injury to the child in the event you have a seizure. Maintain good sleep hygiene. Avoid alcohol.  
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
## 2021-06-22 LAB — TOPIRAMATE LEVEL: Topiramate Lvl: <1.5 ug/mL — ABNORMAL LOW (ref 2.0–25.0)

## 2021-06-23 ENCOUNTER — Other Ambulatory Visit: Payer: Self-pay

## 2021-06-23 ENCOUNTER — Emergency Department (HOSPITAL_COMMUNITY): Payer: Medicaid Other

## 2021-06-23 ENCOUNTER — Encounter (HOSPITAL_COMMUNITY): Payer: Self-pay | Admitting: Emergency Medicine

## 2021-06-23 ENCOUNTER — Inpatient Hospital Stay (HOSPITAL_COMMUNITY)
Admission: EM | Admit: 2021-06-23 | Discharge: 2021-06-26 | DRG: 074 | Discharge status: Against Medical Advice | Payer: Medicaid Other | Attending: Internal Medicine | Admitting: Internal Medicine

## 2021-06-23 DIAGNOSIS — F10129 Alcohol abuse with intoxication, unspecified: Secondary | ICD-10-CM | POA: Diagnosis present

## 2021-06-23 DIAGNOSIS — F17290 Nicotine dependence, other tobacco product, uncomplicated: Secondary | ICD-10-CM | POA: Diagnosis present

## 2021-06-23 DIAGNOSIS — Z79899 Other long term (current) drug therapy: Secondary | ICD-10-CM | POA: Diagnosis not present

## 2021-06-23 DIAGNOSIS — G40509 Epileptic seizures related to external causes, not intractable, without status epilepticus: Secondary | ICD-10-CM | POA: Diagnosis present

## 2021-06-23 DIAGNOSIS — S80812A Abrasion, left lower leg, initial encounter: Secondary | ICD-10-CM | POA: Diagnosis not present

## 2021-06-23 DIAGNOSIS — F1410 Cocaine abuse, uncomplicated: Secondary | ICD-10-CM | POA: Diagnosis present

## 2021-06-23 DIAGNOSIS — F10239 Alcohol dependence with withdrawal, unspecified: Secondary | ICD-10-CM | POA: Diagnosis not present

## 2021-06-23 DIAGNOSIS — R569 Unspecified convulsions: Secondary | ICD-10-CM

## 2021-06-23 DIAGNOSIS — X58XXXA Exposure to other specified factors, initial encounter: Secondary | ICD-10-CM | POA: Diagnosis not present

## 2021-06-23 DIAGNOSIS — Z6833 Body mass index (BMI) 33.0-33.9, adult: Secondary | ICD-10-CM

## 2021-06-23 DIAGNOSIS — E876 Hypokalemia: Secondary | ICD-10-CM | POA: Diagnosis present

## 2021-06-23 DIAGNOSIS — J45909 Unspecified asthma, uncomplicated: Secondary | ICD-10-CM | POA: Diagnosis not present

## 2021-06-23 DIAGNOSIS — E669 Obesity, unspecified: Secondary | ICD-10-CM | POA: Diagnosis present

## 2021-06-23 DIAGNOSIS — F319 Bipolar disorder, unspecified: Secondary | ICD-10-CM | POA: Diagnosis present

## 2021-06-23 DIAGNOSIS — Z91148 Patient's other noncompliance with medication regimen for other reason: Secondary | ICD-10-CM | POA: Diagnosis not present

## 2021-06-23 DIAGNOSIS — R531 Weakness: Principal | ICD-10-CM

## 2021-06-23 DIAGNOSIS — I639 Cerebral infarction, unspecified: Secondary | ICD-10-CM | POA: Diagnosis not present

## 2021-06-23 DIAGNOSIS — R7303 Prediabetes: Secondary | ICD-10-CM | POA: Diagnosis present

## 2021-06-23 DIAGNOSIS — F410 Panic disorder [episodic paroxysmal anxiety] without agoraphobia: Secondary | ICD-10-CM | POA: Diagnosis present

## 2021-06-23 DIAGNOSIS — F17210 Nicotine dependence, cigarettes, uncomplicated: Secondary | ICD-10-CM | POA: Diagnosis present

## 2021-06-23 DIAGNOSIS — I6389 Other cerebral infarction: Secondary | ICD-10-CM | POA: Diagnosis not present

## 2021-06-23 DIAGNOSIS — Z5900 Homelessness unspecified: Secondary | ICD-10-CM | POA: Diagnosis not present

## 2021-06-23 DIAGNOSIS — Z8673 Personal history of transient ischemic attack (TIA), and cerebral infarction without residual deficits: Secondary | ICD-10-CM

## 2021-06-23 DIAGNOSIS — R4587 Impulsiveness: Secondary | ICD-10-CM | POA: Diagnosis present

## 2021-06-23 DIAGNOSIS — R2990 Unspecified symptoms and signs involving the nervous system: Secondary | ICD-10-CM | POA: Diagnosis not present

## 2021-06-23 DIAGNOSIS — I10 Essential (primary) hypertension: Secondary | ICD-10-CM | POA: Diagnosis present

## 2021-06-23 DIAGNOSIS — R000 Tachycardia, unspecified: Secondary | ICD-10-CM | POA: Diagnosis not present

## 2021-06-23 DIAGNOSIS — E785 Hyperlipidemia, unspecified: Secondary | ICD-10-CM | POA: Diagnosis present

## 2021-06-23 DIAGNOSIS — J449 Chronic obstructive pulmonary disease, unspecified: Secondary | ICD-10-CM | POA: Diagnosis present

## 2021-06-23 DIAGNOSIS — S8992XA Unspecified injury of left lower leg, initial encounter: Secondary | ICD-10-CM | POA: Diagnosis present

## 2021-06-23 DIAGNOSIS — Z811 Family history of alcohol abuse and dependence: Secondary | ICD-10-CM | POA: Diagnosis not present

## 2021-06-23 DIAGNOSIS — F10229 Alcohol dependence with intoxication, unspecified: Secondary | ICD-10-CM | POA: Diagnosis present

## 2021-06-23 DIAGNOSIS — G510 Bell's palsy: Secondary | ICD-10-CM | POA: Diagnosis present

## 2021-06-23 DIAGNOSIS — Z20822 Contact with and (suspected) exposure to covid-19: Secondary | ICD-10-CM | POA: Diagnosis present

## 2021-06-23 DIAGNOSIS — F419 Anxiety disorder, unspecified: Secondary | ICD-10-CM | POA: Diagnosis not present

## 2021-06-23 DIAGNOSIS — F1210 Cannabis abuse, uncomplicated: Secondary | ICD-10-CM | POA: Diagnosis present

## 2021-06-23 DIAGNOSIS — Z7952 Long term (current) use of systemic steroids: Secondary | ICD-10-CM | POA: Diagnosis not present

## 2021-06-23 DIAGNOSIS — F1910 Other psychoactive substance abuse, uncomplicated: Secondary | ICD-10-CM | POA: Diagnosis not present

## 2021-06-23 DIAGNOSIS — S80811A Abrasion, right lower leg, initial encounter: Secondary | ICD-10-CM | POA: Diagnosis not present

## 2021-06-23 LAB — COMPREHENSIVE METABOLIC PANEL
ALT: 31 U/L (ref 0–44)
AST: 30 U/L (ref 15–41)
Albumin: 4.3 g/dL (ref 3.5–5.0)
Alkaline Phosphatase: 78 U/L (ref 38–126)
Anion gap: 9 (ref 5–15)
BUN: 10 mg/dL (ref 6–20)
CO2: 23 mmol/L (ref 22–32)
Calcium: 9 mg/dL (ref 8.9–10.3)
Chloride: 106 mmol/L (ref 98–111)
Creatinine, Ser: 0.9 mg/dL (ref 0.61–1.24)
GFR, Estimated: >60 mL/min (ref >60)
Glucose, Bld: 114 mg/dL — ABNORMAL HIGH (ref 70–99)
Potassium: 3 mmol/L — ABNORMAL LOW (ref 3.5–5.1)
Sodium: 138 mmol/L (ref 135–145)
Total Bilirubin: 0.9 mg/dL (ref 0.3–1.2)
Total Protein: 8 g/dL (ref 6.5–8.1)

## 2021-06-23 LAB — CBC
HCT: 43.9 % (ref 39.0–52.0)
Hemoglobin: 15.8 g/dL (ref 13.0–17.0)
MCH: 32.2 pg (ref 26.0–34.0)
MCHC: 36 g/dL (ref 30.0–36.0)
MCV: 89.6 fL (ref 80.0–100.0)
Platelets: 242 10*3/uL (ref 150–400)
RBC: 4.9 MIL/uL (ref 4.22–5.81)
RDW: 13.5 % (ref 11.5–15.5)
WBC: 7.6 10*3/uL (ref 4.0–10.5)
nRBC: 0 % (ref 0.0–0.2)

## 2021-06-23 LAB — DIFFERENTIAL
Abs Immature Granulocytes: 0.01 10*3/uL (ref 0.00–0.07)
Basophils Absolute: 0 10*3/uL (ref 0.0–0.1)
Basophils Relative: 1 %
Eosinophils Absolute: 0.1 10*3/uL (ref 0.0–0.5)
Eosinophils Relative: 1 %
Immature Granulocytes: 0 %
Lymphocytes Relative: 31 %
Lymphs Abs: 2.4 10*3/uL (ref 0.7–4.0)
Monocytes Absolute: 0.6 10*3/uL (ref 0.1–1.0)
Monocytes Relative: 8 %
Neutro Abs: 4.5 10*3/uL (ref 1.7–7.7)
Neutrophils Relative %: 59 %

## 2021-06-23 LAB — URINALYSIS, ROUTINE W REFLEX MICROSCOPIC
Bilirubin Urine: NEGATIVE
Glucose, UA: NEGATIVE mg/dL
Hgb urine dipstick: NEGATIVE
Ketones, ur: NEGATIVE mg/dL
Nitrite: POSITIVE — AB
Protein, ur: NEGATIVE mg/dL
Specific Gravity, Urine: >1.046 — ABNORMAL HIGH (ref 1.005–1.030)
pH: 6 (ref 5.0–8.0)

## 2021-06-23 LAB — RESP PANEL BY RT-PCR (FLU A&B, COVID) ARPGX2
Influenza A by PCR: NEGATIVE
Influenza B by PCR: NEGATIVE
SARS Coronavirus 2 by RT PCR: NEGATIVE

## 2021-06-23 LAB — I-STAT CHEM 8, ED
BUN: 8 mg/dL (ref 6–20)
Calcium, Ion: 1.14 mmol/L — ABNORMAL LOW (ref 1.15–1.40)
Chloride: 102 mmol/L (ref 98–111)
Creatinine, Ser: 0.8 mg/dL (ref 0.61–1.24)
Glucose, Bld: 113 mg/dL — ABNORMAL HIGH (ref 70–99)
HCT: 47 % (ref 39.0–52.0)
Hemoglobin: 16 g/dL (ref 13.0–17.0)
Potassium: 3.1 mmol/L — ABNORMAL LOW (ref 3.5–5.1)
Sodium: 137 mmol/L (ref 135–145)
TCO2: 22 mmol/L (ref 22–32)

## 2021-06-23 LAB — RAPID URINE DRUG SCREEN, HOSP PERFORMED
Amphetamines: NOT DETECTED
Barbiturates: NOT DETECTED
Benzodiazepines: POSITIVE — AB
Cocaine: POSITIVE — AB
Opiates: NOT DETECTED
Tetrahydrocannabinol: POSITIVE — AB

## 2021-06-23 LAB — PROTIME-INR
INR: 1 (ref 0.8–1.2)
Prothrombin Time: 13.3 seconds (ref 11.4–15.2)

## 2021-06-23 LAB — APTT: aPTT: 33 seconds (ref 24–36)

## 2021-06-23 LAB — VALPROIC ACID LEVEL: Valproic Acid Lvl: <10 ug/mL — ABNORMAL LOW (ref 50.0–100.0)

## 2021-06-23 LAB — CBG MONITORING, ED: Glucose-Capillary: 122 mg/dL — ABNORMAL HIGH (ref 70–99)

## 2021-06-23 LAB — ETHANOL: Alcohol, Ethyl (B): <10 mg/dL (ref <10)

## 2021-06-23 LAB — AMMONIA: Ammonia: 21 umol/L (ref 9–35)

## 2021-06-23 MED ORDER — STROKE: EARLY STAGES OF RECOVERY BOOK
Freq: Once | Status: DC
  Filled 2021-06-23: qty 1

## 2021-06-23 MED ORDER — ASPIRIN EC 81 MG PO TBEC
81.0000 mg | DELAYED_RELEASE_TABLET | Freq: Every day | ORAL | Status: DC
  Administered 2021-06-24 – 2021-06-25 (×2): 81 mg via ORAL
  Filled 2021-06-23 (×3): qty 1

## 2021-06-23 MED ORDER — LORAZEPAM 1 MG PO TABS
1.0000 mg | ORAL_TABLET | ORAL | Status: DC | PRN
  Administered 2021-06-25 (×2): 1 mg via ORAL
  Administered 2021-06-26: 2 mg via ORAL
  Filled 2021-06-23: qty 1
  Filled 2021-06-23: qty 2
  Filled 2021-06-23: qty 1

## 2021-06-23 MED ORDER — ACETAMINOPHEN 650 MG RE SUPP: 650.0000 mg | RECTAL | Status: DC | PRN

## 2021-06-23 MED ORDER — ENOXAPARIN SODIUM 40 MG/0.4ML IJ SOSY
40.0000 mg | PREFILLED_SYRINGE | INTRAMUSCULAR | Status: DC
  Administered 2021-06-23 – 2021-06-25 (×3): 40 mg via SUBCUTANEOUS
  Filled 2021-06-23 (×3): qty 0.4

## 2021-06-23 MED ORDER — IOHEXOL 350 MG/ML SOLN
100.0000 mL | Freq: Once | INTRAVENOUS | Status: AC | PRN
  Administered 2021-06-23: 100 mL via INTRAVENOUS

## 2021-06-23 MED ORDER — LORAZEPAM 2 MG/ML IJ SOLN
1.0000 mg | INTRAMUSCULAR | Status: DC | PRN
  Administered 2021-06-25: 2 mg via INTRAVENOUS
  Administered 2021-06-26 (×2): 4 mg via INTRAVENOUS
  Filled 2021-06-23 (×2): qty 1
  Filled 2021-06-23 (×2): qty 2

## 2021-06-23 MED ORDER — CLOPIDOGREL BISULFATE 75 MG PO TABS
300.0000 mg | ORAL_TABLET | Freq: Once | ORAL | Status: AC
  Administered 2021-06-23: 300 mg via ORAL
  Filled 2021-06-23: qty 4

## 2021-06-23 MED ORDER — ASPIRIN 300 MG RE SUPP: 300.0000 mg | Freq: Once | RECTAL | Status: AC

## 2021-06-23 MED ORDER — ADULT MULTIVITAMIN W/MINERALS CH
1.0000 | ORAL_TABLET | Freq: Every day | ORAL | Status: DC
  Administered 2021-06-23 – 2021-06-26 (×4): 1 via ORAL
  Filled 2021-06-23 (×4): qty 1

## 2021-06-23 MED ORDER — FOLIC ACID 1 MG PO TABS
1.0000 mg | ORAL_TABLET | Freq: Every day | ORAL | Status: DC
  Administered 2021-06-23 – 2021-06-26 (×4): 1 mg via ORAL
  Filled 2021-06-23 (×5): qty 1

## 2021-06-23 MED ORDER — SENNOSIDES-DOCUSATE SODIUM 8.6-50 MG PO TABS: 1.0000 | ORAL_TABLET | Freq: Every evening | ORAL | Status: DC | PRN

## 2021-06-23 MED ORDER — CLOPIDOGREL BISULFATE 75 MG PO TABS
75.0000 mg | ORAL_TABLET | Freq: Every day | ORAL | Status: DC
  Administered 2021-06-24 – 2021-06-25 (×2): 75 mg via ORAL
  Filled 2021-06-23 (×2): qty 1

## 2021-06-23 MED ORDER — ACETAMINOPHEN 160 MG/5ML PO SOLN: 650.0000 mg | ORAL | Status: DC | PRN

## 2021-06-23 MED ORDER — ACETAMINOPHEN 325 MG PO TABS
650.0000 mg | ORAL_TABLET | ORAL | Status: DC | PRN
  Filled 2021-06-23: qty 2

## 2021-06-23 MED ORDER — ACETAMINOPHEN 325 MG PO TABS: 650.0000 mg | ORAL_TABLET | ORAL | Status: DC | PRN

## 2021-06-23 MED ORDER — THIAMINE HCL 100 MG/ML IJ SOLN: 100.0000 mg | Freq: Every day | INTRAMUSCULAR | Status: DC

## 2021-06-23 MED ORDER — DIVALPROEX SODIUM ER 500 MG PO TB24
1000.0000 mg | ORAL_TABLET | Freq: Three times a day (TID) | ORAL | Status: DC
  Administered 2021-06-23 – 2021-06-24 (×4): 1000 mg via ORAL
  Filled 2021-06-23 (×4): qty 2

## 2021-06-23 MED ORDER — ASPIRIN 325 MG PO TABS
325.0000 mg | ORAL_TABLET | Freq: Once | ORAL | Status: AC
  Administered 2021-06-23: 325 mg via ORAL
  Filled 2021-06-23: qty 1

## 2021-06-23 MED ORDER — SODIUM CHLORIDE 0.9 % IV SOLN: INTRAVENOUS | Status: DC

## 2021-06-23 MED ORDER — THIAMINE HCL 100 MG PO TABS
100.0000 mg | ORAL_TABLET | Freq: Every day | ORAL | Status: DC
  Administered 2021-06-23 – 2021-06-26 (×4): 100 mg via ORAL
  Filled 2021-06-23 (×4): qty 1

## 2021-06-23 NOTE — Assessment & Plan Note (Addendum)
Stable  ?Continue home Depakote ?Most recent Depakote level subtherapeutic ?Prn ativan in setting of CIWA protocol  ?Follow up neuro recs  ?

## 2021-06-23 NOTE — Assessment & Plan Note (Signed)
K 3.0 ?Replete  ?Check Mag level  ?

## 2021-06-23 NOTE — ED Notes (Signed)
Patient returned from MRI at this time.  

## 2021-06-23 NOTE — Assessment & Plan Note (Addendum)
+   ETOH abuse  ?ETOH level WNL  ?Start CIWA protocol  ?Thiamine, folate, multivitamin  ?-TOC consulted for SA resources  ?

## 2021-06-23 NOTE — ED Triage Notes (Addendum)
Patient here for HTN after being turned away from drug rehab. While in waiting room patient passed out. Complaining of left arm numbness that started while on way to ED. Noted with right facial droop. States he noticed it sometime this morning. CBG 122. MD called to bedside to assess patient, code stroke called at 1720. ?

## 2021-06-23 NOTE — ED Notes (Signed)
Beeped out and Called CODE STROKE . ?

## 2021-06-23 NOTE — ED Provider Notes (Signed)
?[name] ?Provider Note ? ? ?CSN: [id number] ?Arrival date & time: 06/23/21  1703 ? ?An emergency department physician performed an initial assessment on this suspected stroke patient at 1720. ? ?History ? ?Chief Complaint  ?Patient presents with  ? Code Stroke  ? ? ?[name] is a 56 y.o. male. ? ?Patient has a history of COPD.  He presented with left arm numbness and weakness and left leg numbness which occurred about 30 minutes prior to come to the emergency department.  He also has right facial drooping which occurred earlier today. ? ?The history is provided by the patient and medical records. No language interpreter was used.  ?Weakness ?Severity:  Moderate ?Onset quality:  Sudden ?Timing:  Constant ?Progression:  Worsening ?Chronicity:  New ?Context: not alcohol use   ?Relieved by:  Nothing ?Worsened by:  Nothing ?Associated symptoms: no abdominal pain, no chest pain, no cough, no diarrhea, no frequency, no headaches and no seizures   ? ?  ? ?Home Medications ?Prior to Admission medications   ?Medication Sig Start Date End Date Taking? Authorizing Provider  ?albuterol (PROVENTIL HFA;VENTOLIN HFA) 108 (90 BASE) MCG/ACT inhaler Inhale 2 puffs into the lungs every 6 (six) hours as needed. For shortness of breath   Yes [provider]  ?divalproex (DEPAKOTE ER) 500 MG 24 hr tablet Take 2 tablets (1,000 mg total) by mouth 3 (three) times daily. 10/27/18  Yes Kari Baars, MD  ?amoxicillin-clavulanate (AUGMENTIN) 875-125 MG tablet Take 1 tablet by mouth 2 (two) times daily. ?Patient not taking: Reported on 06/23/2021 06/21/21   Gilda Crease, MD  ?doxycycline (VIBRAMYCIN) 100 MG capsule Take 1 capsule (100 mg total) by mouth 2 (two) times daily. ?Patient not taking: Reported on 06/23/2021 06/21/21   Gilda Crease, MD  ?   ? ?Allergies    ?Patient has no known allergies.   ? ?Review of Systems   ?Review of Systems  ?Constitutional:  Negative for appetite change  and fatigue.  ?HENT:  Negative for congestion, ear discharge and sinus pressure.   ?Eyes:  Negative for discharge.  ?Respiratory:  Negative for cough.   ?Cardiovascular:  Negative for chest pain.  ?Gastrointestinal:  Negative for abdominal pain and diarrhea.  ?Genitourinary:  Negative for frequency and hematuria.  ?Musculoskeletal:  Negative for back pain.  ?Skin:  Negative for rash.  ?Neurological:  Positive for weakness. Negative for seizures and headaches.  ?     Left arm and leg numbness and weakness with right facial droop  ?Psychiatric/Behavioral:  Negative for hallucinations.   ? ?Physical Exam ?Updated Vital Signs ?BP (!) 123/93   Pulse 91   Temp 97.9 ?F (36.6 ?C) (Oral)   Resp 16   Wt 115.7 kg   SpO2 98%   BMI 33.65 kg/m?  ?Physical Exam ?Vitals and nursing note reviewed.  ?Constitutional:   ?   Appearance: He is well-developed.  ?HENT:  ?   Head: Normocephalic.  ?   Nose: Nose normal.  ?Eyes:  ?   General: No scleral icterus. ?   Conjunctiva/sclera: Conjunctivae normal.  ?Neck:  ?   Thyroid: No thyromegaly.  ?Cardiovascular:  ?   Rate and Rhythm: Normal rate and regular rhythm.  ?   Heart sounds: No murmur heard. ?  No friction rub. No gallop.  ?Pulmonary:  ?   Breath sounds: No stridor. No wheezing or rales.  ?Chest:  ?   Chest wall: No tenderness.  ?Abdominal:  ?   General:  There is no distension.  ?   Tenderness: There is no abdominal tenderness. There is no rebound.  ?Musculoskeletal:     ?   General: Normal range of motion.  ?   Cervical back: Neck supple.  ?Lymphadenopathy:  ?   Cervical: No cervical adenopathy.  ?Skin: ?   Findings: No erythema or rash.  ?Neurological:  ?   Mental Status: He is alert and oriented to person, place, and time.  ?   Motor: No abnormal muscle tone.  ?   Coordination: Coordination normal.  ?   Comments: Right facial droop, with left arm weakness and numbness in his left arm and left leg  ?Psychiatric:     ?   Behavior: Behavior normal.  ? ? ?ED Results / Procedures  / Treatments   ?Labs ?(all labs ordered are listed, but only abnormal results are displayed) ?Labs Reviewed  ?COMPREHENSIVE METABOLIC PANEL - Abnormal; Notable for the following components:  ?    Result Value  ? Potassium 3.0 (*)   ? Glucose, Bld 114 (*)   ? All other components within normal limits  ?URINALYSIS, ROUTINE W REFLEX MICROSCOPIC - Abnormal; Notable for the following components:  ? Specific Gravity, Urine >1.046 (*)   ? Nitrite POSITIVE (*)   ? Leukocytes,Ua TRACE (*)   ? Bacteria, UA RARE (*)   ? All other components within normal limits  ?CBG MONITORING, ED - Abnormal; Notable for the following components:  ? Glucose-Capillary 122 (*)   ? All other components within normal limits  ?I-STAT CHEM 8, ED - Abnormal; Notable for the following components:  ? Potassium 3.1 (*)   ? Glucose, Bld 113 (*)   ? Calcium, Ion 1.14 (*)   ? All other components within normal limits  ?RESP PANEL BY RT-PCR (FLU A&B, COVID) [id number]  ?ETHANOL  ?PROTIME-INR  ?APTT  ?CBC  ?DIFFERENTIAL  ?RAPID URINE DRUG SCREEN, HOSP PERFORMED  ? ? ?EKG ?None ? ?Radiology ?MR ANGIO HEAD WO CONTRAST ? ?Result Date: 06/23/2021 ?CLINICAL DATA:  Neuro deficit, acute, stroke suspected. Left arm numbness and facial droop. EXAM: MRI HEAD WITHOUT CONTRAST MRA HEAD WITHOUT CONTRAST TECHNIQUE: Multiplanar, multi-echo pulse sequences of the brain and surrounding structures were acquired without intravenous contrast. Angiographic images of the Circle of Willis were acquired using MRA technique without intravenous contrast. COMPARISON:  Head CT and CTA 06/23/2021.  Head MRI 10/18/2010. FINDINGS: MRI HEAD FINDINGS Brain: There is no evidence of an acute infarct, intracranial hemorrhage, mass, midline shift, or extra-axial fluid collection. The ventricles are normal in size. There is a small chronic left opercular infarct. Scattered punctate T2 hyperintensities in the cerebral white matter bilaterally are nonspecific but may reflect minimal chronic small  vessel ischemic disease. Vascular: Major intracranial vascular flow voids are preserved. Skull and upper cervical spine: Unremarkable bone marrow signal. Sinuses/Orbits: Unremarkable orbits. Mild mucosal thickening in the paranasal sinuses. No significant mastoid fluid. Other: None. MRA HEAD FINDINGS The study is mildly motion degraded. Anterior circulation: The internal carotid arteries are widely patent from skull base to carotid termini. The left A1 segment is diminutive and poorly visualized, likely congenitally hypoplastic, with the left ACA being predominantly supplied by the anterior communicating artery. The right ACA and both MCAs are patent without evidence of a significant proximal stenosis. No aneurysm is identified. Posterior circulation: The included portion of the distal left vertebral artery is widely patent and supplies the basilar. The right vertebral artery is hypoplastic and poorly visualized. The basilar artery  is widely patent. Patent left PICA and bilateral SCA origins are identified. There is a patent right posterior communicating artery. Both PCAs are patent without evidence of a significant proximal stenosis. No aneurysm is identified. Anatomic variants: Hypoplastic right vertebral artery and left A1 segment. IMPRESSION: 1. No acute intracranial abnormality. 2. Small chronic left opercular infarct. 3. No large vessel occlusion or significant proximal stenosis. Electronically Signed   By: Sebastian AcheAllen  Grady M.D.   On: 06/23/2021 19:17  ? ?MR BRAIN WO CONTRAST ? ?Result Date: 06/23/2021 ?CLINICAL DATA:  Neuro deficit, acute, stroke suspected. Left arm numbness and facial droop. EXAM: MRI HEAD WITHOUT CONTRAST MRA HEAD WITHOUT CONTRAST TECHNIQUE: Multiplanar, multi-echo pulse sequences of the brain and surrounding structures were acquired without intravenous contrast. Angiographic images of the Circle of Willis were acquired using MRA technique without intravenous contrast. COMPARISON:  Head CT and  CTA 06/23/2021.  Head MRI 10/18/2010. FINDINGS: MRI HEAD FINDINGS Brain: There is no evidence of an acute infarct, intracranial hemorrhage, mass, midline shift, or extra-axial fluid collection. The ventricles are

## 2021-06-23 NOTE — ED Notes (Addendum)
Pt refused tylenol for pain. Pt stated that "I'm not going to sit here in pain. I will leave and drop dead before I sit here in pain. If I would have known ya'll weren't going to give me anything for pain, I would have left when my ride did." Reminded pt why he was in the hospital and the harm it could cause him if he left AMA. Pt calmed down and decided to stay for now. ? ?Provider made aware ?

## 2021-06-23 NOTE — Assessment & Plan Note (Signed)
Stable from a resp standpoint  ?Cont home inhalers ?Follow  ?

## 2021-06-23 NOTE — ED Notes (Signed)
Mom states that patient started complaining of left arm numbness while at [facility]  ?

## 2021-06-23 NOTE — ED Notes (Signed)
Patient to MRI at this time.

## 2021-06-23 NOTE — ED Notes (Signed)
Patient is inconsistent with symptoms and time frame. States at one point that he developed weakness to left side two days ago and then woke with right side facial droop this then reports he noticed it at 1000. Reports that he has been sleeping in the woods and has removed at least 20 of them. Noted two to be crawling on him. ?

## 2021-06-23 NOTE — H&P (Signed)
?History and Physical  ? ? ?Patient: [name] [id number] DOB: [birth year] ?DOA: 06/23/2021 ?DOS: the patient was seen and examined on 06/23/2021 ?PCP: Pcp, No  ?Patient coming from: Home ? ?Chief Complaint:  ?Chief Complaint  ?Patient presents with  ? Code Stroke  ? ?HPI: [name] is a 56 y.o. 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  Patient reports developing generalized weakness around 10 AM today while at an alcoholic treatment facility.  Because of symptoms, per report, patient states he had his blood pressure checked which showed systolic pressures in the 190s.  While symptoms are somewhat vague, patient does report having right-sided facial droop as well as some dysarthria and left-sided weakness.  Patient reports having left lower extremity weakness in the setting of a bulging disks in the past.  Patient denies any significant episodes like this in the past.  Patient does admit to having mild weakness and headaches associated with alcohol as well as polysubstance abuse.  Patient reports taking crack cocaine within the past week or so.  No chest pain.  No abdominal pain.  No vomiting or diarrhea.  Drinks cocktails on a regular basis.  1/2 pack/day smoker.  Has a baseline history of seizure disorder.  Patient reports having seizures associated with alcohol use. ?Presented to the ER afebrile, hemodynamically stable.  Code stroke called.  Preliminary imaging including CT head as well as MRI and MRI of the brain are grossly within normal limits apart from a small chronic left opercular infarct.  Labs grossly within normal limits apart from UDS positive for marijuana benzos and cocaine.  Alcohol level within normal limits.  Potassium 3.0.  Case discussed with on-call neurologist Dr. Thea Alken. Plan for admission with formal in house neuro consult with concern for possible brainstem  infarct.  ?Review of Systems: As mentioned in the history of present illness. All other systems reviewed and are negative. ?Past Medical History:  ?Diagnosis Date  ? Arthritis   ? Asthma   ? Chronic neck pain   ? COPD (chronic obstructive pulmonary disease) ([place])   ? Injury of jawline   ? Mental disorder   ? Ruptured disc, cervical   ? Seizures (HCC)   ? Torn rotator cuff   ? ?History reviewed. No pertinent surgical history. ?Social History:  reports that he has been smoking cigarettes and cigars. He has been smoking an average of .5 packs per day. He has never used smokeless tobacco. He reports current alcohol use of about 12.0 standard drinks per week. He reports that he does not currently use drugs after having used the following drugs: Cocaine and Marijuana. ? ?No Known Allergies ? ?Family History  ?Problem Relation Age of Onset  ? Heart failure Father   ? Cancer Father   ? Alcoholism Brother   ? ? ?Prior to Admission medications   ?Medication Sig Start Date End Date Taking? Authorizing Provider  ?albuterol (PROVENTIL HFA;VENTOLIN HFA) 108 (90 BASE) MCG/ACT inhaler Inhale 2 puffs into the lungs every 6 (six) hours as needed. For shortness of breath   Yes [provider]  ?divalproex (DEPAKOTE ER) 500 MG 24 hr tablet Take 2 tablets (1,000 mg total) by mouth 3 (three) times daily. 10/27/18  Yes Sinda Du, MD  ?amoxicillin-clavulanate (AUGMENTIN) 875-125 MG tablet Take 1 tablet by mouth 2 (two) times daily. ?Patient not taking: Reported on 06/23/2021 06/21/21   Orpah Greek, MD  ?  doxycycline (VIBRAMYCIN) 100 MG capsule Take 1 capsule (100 mg total) by mouth 2 (two) times daily. ?Patient not taking: Reported on 06/23/2021 06/21/21   Orpah Greek, MD  ? ? ?Physical Exam: ?Vitals:  ? 06/23/21 1945 06/23/21 2000 06/23/21 2030 06/23/21 2045  ?BP: (!) 144/94 (!) 123/93 (!) 134/100 (!) 149/87  ?Pulse: 85 91  92  ?Resp: 17 16 (!) 27 (!) 24  ?Temp:      ?TempSrc:      ?SpO2: 95% 98%  97%   ?Weight:      ? ?Physical Exam ?Constitutional:   ?   General: He is not in acute distress. ?HENT:  ?   Head:  ?   Comments: Minimal to mild R sided facial droop  ?   Mouth/Throat:  ?   Mouth: Mucous membranes are moist.  ?Eyes:  ?   Pupils: Pupils are equal, round, and reactive to light.  ?Cardiovascular:  ?   Rate and Rhythm: Normal rate and regular rhythm.  ?Pulmonary:  ?   Effort: Pulmonary effort is normal.  ?Abdominal:  ?   General: Bowel sounds are normal.  ?Musculoskeletal:  ?   Cervical back: Normal range of motion.  ?   Comments: + L sided upper and lower extremity weakness  ?  ?Skin: ?   General: Skin is warm.  ?Neurological:  ?   Comments: + minimal to mild R sided facial droop  ?+ L upper and lower extremity weakness  ?  ?Psychiatric:     ?   Mood and Affect: Mood normal.  ?  ?Data Reviewed:There are no new results to review at this time. ?MR ANGIO HEAD WO CONTRAST ?CLINICAL DATA:  Neuro deficit, acute, stroke suspected. Left arm ?numbness and facial droop. ? ?EXAM: ?MRI HEAD WITHOUT CONTRAST ? ?MRA HEAD WITHOUT CONTRAST ? ?TECHNIQUE: ?Multiplanar, multi-echo pulse sequences of the brain and surrounding ?structures were acquired without intravenous contrast. Angiographic ?images of the Circle of Willis were acquired using MRA technique ?without intravenous contrast. ? ?COMPARISON:  Head CT and CTA 06/23/2021.  Head MRI 10/18/2010. ? ?FINDINGS: ?MRI HEAD FINDINGS ? ?Brain: There is no evidence of an acute infarct, intracranial ?hemorrhage, mass, midline shift, or extra-axial fluid collection. ?The ventricles are normal in size. There is a small chronic left ?opercular infarct. Scattered punctate T2 hyperintensities in the ?cerebral white matter bilaterally are nonspecific but may reflect ?minimal chronic small vessel ischemic disease. ? ?Vascular: Major intracranial vascular flow voids are preserved. ? ?Skull and upper cervical spine: Unremarkable bone marrow signal. ? ?Sinuses/Orbits: Unremarkable  orbits. Mild mucosal thickening in the ?paranasal sinuses. No significant mastoid fluid. ? ?Other: None. ? ?MRA HEAD FINDINGS ? ?The study is mildly motion degraded. ? ?Anterior circulation: The internal carotid arteries are widely ?patent from skull base to carotid termini. The left A1 segment is ?diminutive and poorly visualized, likely congenitally hypoplastic, ?with the left ACA being predominantly supplied by the anterior ?communicating artery. The right ACA and both MCAs are patent without ?evidence of a significant proximal stenosis. No aneurysm is ?identified. ? ?Posterior circulation: The included portion of the distal left ?vertebral artery is widely patent and supplies the basilar. The ?right vertebral artery is hypoplastic and poorly visualized. The ?basilar artery is widely patent. Patent left PICA and bilateral SCA ?origins are identified. There is a patent right posterior ?communicating artery. Both PCAs are patent without evidence of a ?significant proximal stenosis. No aneurysm is identified. ? ?Anatomic variants: Hypoplastic right vertebral artery and left  A1 ?segment. ? ?IMPRESSION: ?1. No acute intracranial abnormality. ?2. Small chronic left opercular infarct. ?3. No large vessel occlusion or significant proximal stenosis. ? ?Electronically Signed ?  By: Logan Bores M.D. ?  On: 06/23/2021 19:17 ?MR BRAIN WO CONTRAST ?CLINICAL DATA:  Neuro deficit, acute, stroke suspected. Left arm ?numbness and facial droop. ? ?EXAM: ?MRI HEAD WITHOUT CONTRAST ? ?MRA HEAD WITHOUT CONTRAST ? ?TECHNIQUE: ?Multiplanar, multi-echo pulse sequences of the brain and surrounding ?structures were acquired without intravenous contrast. Angiographic ?images of the Circle of Willis were acquired using MRA technique ?without intravenous contrast. ? ?COMPARISON:  Head CT and CTA 06/23/2021.  Head MRI 10/18/2010. ? ?FINDINGS: ?MRI HEAD FINDINGS ? ?Brain: There is no evidence of an acute infarct, intracranial ?hemorrhage,  mass, midline shift, or extra-axial fluid collection. ?The ventricles are normal in size. There is a small chronic left ?opercular infarct. Scattered punctate T2 hyperintensities in the ?cerebral white matter bilat

## 2021-06-23 NOTE — Assessment & Plan Note (Addendum)
Pt self admits to polydrug use including cocaine, ETOH, MJ  ?UDS + for MJ, benzos and cocaine  ?Will place on CIWA protocol  ?Prn ativan  ?Regular ETOH use  ?Follow up ammonia level  ?Thiamine, folate, multivitamin  ?

## 2021-06-23 NOTE — Consult Note (Addendum)
[facility] Telemedicine Consult ? ? ?Requesting Provider: Milton Ferguson ?Consult Participants: Myself, bedside nurse, atrium nurse, patient, family members  ?Location of the provider: [place], [place] ?Location of the patient: [place] ? ?This consult was provided via telemedicine with 2-way video and audio communication. The patient/family was informed that care would be provided in this way and agreed to receive care in this manner.  ? ? ?Chief Complaint: Elevated blood pressure, right facial droop, left-sided numbness ? ?HPI: 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 ? ?The history he provides is somewhat inconsistent.  As best as we are able to piece together, he was last in his normal state of health yesterday evening when he went to bed around midnight or 1 AM.  Certainly by 11 AM today he was noted to be having a right facial droop.  He reports he has also been having some shortness of breath today but denies any fevers.  He asked his family to take him to an alcohol treatment center, but his blood pressure on arrival was elevated to the 190s and therefore he was sent to the ED.  On route he began to have some left-sided numbness in the arm that is new.  He additionally is having some blurry vision in the right eye ? ?LKW: 1 AM 5/12 ?tpa given?: No, out of the window ?IR Thrombectomy? No, no LVO ?Modified Rankin Scale: 1-No significant post stroke disability and can perform usual duties with stroke symptoms ?Time of teleneurologist evaluation: 5:36 PM ? ?Exam: ?Current vital signs: ?BP (!) 128/101   Pulse 85   Temp 97.9 ?F (36.6 ?C) (Oral)   Resp 17   Wt 115.7 kg   SpO2 99%   BMI 33.65 kg/m?  ?Vital signs in last 24 hours: ?Temp:  [97.9 ?F (36.6 ?C)] 97.9 ?F (36.6 ?C) (05/12 1738) ?Pulse Rate:  [85-93] 85 (05/12 1922) ?Resp:  [15-25] 17 (05/12 1922) ?BP: (128-147)/(79-102)  128/101 (05/12 1922) ?SpO2:  [95 %-99 %] 99 % (05/12 1922) ?Weight:  [115.7 kg] 115.7 kg (05/12 1726) ? ?General: No acute distress ?Pulmonary: breathing comfortably ?Cardiac: regular rate and rhythm on monitor  ? ?NIH Stroke scale ?1A: Level of Consciousness - 0 ?1B: Ask Month and Age - 0 ?1C: 'Blink Eyes' & 'Squeeze Hands' - 0 ?2: Test Horizontal Extraocular Movements - 1 -- right eye incomplete abduction, reports double vision to the right ?3: Test Visual Fields - 1 --not a true hemianopia, but does have some increased difficulty seeing on the right side with only the right eye, not the left eye ?4: Test Facial Palsy - 2 right facial droop ?5A: Test Left Arm Motor Drift - 1 left arm numbness ?5B: Test Right Arm Motor Drift - 0 ?6A: Test Left Leg Motor Drift - 1 ?6B: Test Right Leg Motor Drift - 0 ?7: Test Limb Ataxia - 0 ?8: Test Sensation - 1 left arm and leg numbness ?9: Test Language/Aphasia- 0 ?10: Test Dysarthria - 1 ?11: Test Extinction/Inattention - 0 ?NIHSS score: 8 ? ? ?Imaging Reviewed:  ?Head CT personally reviewed, no acute intracranial process ?CTA/CTP personally reviewed, suboptimal study without clear large vessel occlusion or tissue at risk ?MRI brain personally reviewed, no acute intracranial process ? ?Labs reviewed in epic and pertinent values follow: ? ?Basic Metabolic Panel: ?Recent Labs  ?Lab 06/21/21 ?0111 06/23/21 ?1726 06/23/21 ?1738  ?[place]ris 138 138 137  ?K  3.4* 3.0* 3.1*  ?CL 105 106 102  ?CO2 20* 23  --   ?GLUCOSE 118* 114* 113*  ?BUN 12 10 8   ?CREATININE 0.80 0.90 0.80  ?CALCIUM 8.6* 9.0  --   ? ? ?CBC: ?Recent Labs  ?Lab 06/21/21 ?0111 06/23/21 ?1726 06/23/21 ?1738  ?WBC 6.0 7.6  --   ?NEUTROABS 2.6 4.5  --   ?HGB 16.2 15.8 16.0  ?HCT 45.9 43.9 47.0  ?MCV 90.5 89.6  --   ?PLT 230 242  --   ? ? ?Coagulation Studies: ?Recent Labs  ?  06/23/21 ?1726  ?LABPROT 13.3  ?INR 1.0  ?  ? ? ?Assessment: This is a 55 year old man presenting with right eye vision changes, right facial droop, left arm  and leg numbness and weakness with unclear timeline of symptom onset.  While his blood pressure was quite elevated per her report at the alcohol rehab facility, here his blood pressures have normalized here.  While the history is somewhat challenging and video neurological examination has with limitations, the crossed findings of right face weakness and left arm and leg weakness are concerning for potential brainstem stroke (pons at the level of the facial nucleus).  ? ?Recommendations:  ?-No tPA secondary to out of the window ?-No intra-arterial thrombectomy secondary to no LVO ?-MRI brain and MRA to evaluate for infarct ?-Given the possibility of a small MRI negative brainstem stroke with the crossed findings on teleneurology evaluation, would recommend full stroke work-up ?-Permissive hypertension to 220/110 for 24-48 hrs ?-ECHO, A1c, lipid panel, tele, PT/OT/SLP eval ?-325 mg ASA, followed by 81 mg daily ?-Please add 300 mg Plavix followed by 75 mg daily if patient confirms no recent bleeding issues ?-Given no neurology at [facility] over the weekend, appropriate for medicine admission at [facility], stroke team to follow in consultation.  ? ?This patient is receiving care for possible acute neurological changes. There was 45 minutes of care by this provider at the time of service, including time for direct evaluation via telemedicine, review of medical records, imaging studies and discussion of findings with providers, the patient and/or family. ? ?Lesleigh Noe MD-PhD ?[facility] ?[phone] ?If 8pm-8am, please page neurology on call as listed in [place]. ? ?CRITICAL CARE ?Performed by: Lorenza Chick ? ? ?Total critical care time: 45 minutes ? ?Critical care time was exclusive of separately billable procedures and treating other patients. ? ?Critical care was necessary to treat or prevent imminent or life-threatening deterioration, critical decision making regarding potential to treat with IV  thrombolytic and evaluation for potential interarterial thrombectomy . ? ?Critical care was time spent personally by me on the following activities: development of treatment plan with patient and/or surrogate as well as nursing, discussions with consultants, evaluation of patient's response to treatment, examination of patient, obtaining history from patient or surrogate, ordering and performing treatments and interventions, ordering and review of laboratory studies, ordering and review of radiographic studies, pulse oximetry and re-evaluation of patient's condition. ? ? ?

## 2021-06-23 NOTE — ED Notes (Signed)
Pt given urinal at bedside.

## 2021-06-23 NOTE — ED Triage Notes (Addendum)
This RN called out to waiting area. Registration reported that patient blacked out. When this RN arrived, pt was on all 4s in the waiting room alert and dry heaving. Pt stated he momentarily "blacked out." Pt's reported that he is trying to get into rehab and went to [facility] today. His BP there was 190/120 and was told to come to ED. Pt stated that on the way to the ED, he began to feel dizzy and his LT arm felt numb. Pt noted to have RIGHT sided facial droop. Family reports that she noticed it this morning. Pt hx of seizures, ETOH abuse, and crack cocaine use. EDP notified and at beside. Code stroke initiated.  ?

## 2021-06-23 NOTE — Consult Note (Addendum)
Code stroke activated at 1720-pt in CT. ? ?Returned from [place] at 1731. ? ?Dr Curly Shores on camera for neuro exam at 1736. ? ?Off camera at 1750. ?

## 2021-06-23 NOTE — Assessment & Plan Note (Addendum)
+   R sided facial droop and L sided weakness since 10am today  ?Noted RFs include HTN, polysubstance abuse, tobacco abuse ?S/p code stroke in ER  ?CT head, MRI, MRA head grossly stable.  ?Per neuro recs, some concern for brainstem CVA ?Will send to [facility] where formal neurology rounding is available per recs ?Cont stroke eval including 2D ECHO, carotid dopplers, risk stratification labs  ?Permissive hypertension to 220/110 for 24-48 hrs ?325 mg ASA, followed by 81 mg daily ?300 mg Plavix followed by 75 mg daily  ?Follow up neuro recs in am  ?

## 2021-06-24 ENCOUNTER — Other Ambulatory Visit (HOSPITAL_COMMUNITY): Payer: Self-pay | Admitting: *Deleted

## 2021-06-24 ENCOUNTER — Inpatient Hospital Stay (HOSPITAL_COMMUNITY): Payer: Medicaid Other

## 2021-06-24 DIAGNOSIS — E785 Hyperlipidemia, unspecified: Secondary | ICD-10-CM

## 2021-06-24 DIAGNOSIS — I6389 Other cerebral infarction: Secondary | ICD-10-CM

## 2021-06-24 DIAGNOSIS — R7303 Prediabetes: Secondary | ICD-10-CM | POA: Diagnosis present

## 2021-06-24 LAB — LIPID PANEL
Cholesterol: 193 mg/dL (ref 0–200)
HDL: 43 mg/dL (ref >40)
LDL Cholesterol: 121 mg/dL — ABNORMAL HIGH (ref 0–99)
Total CHOL/HDL Ratio: 4.5 RATIO
Triglycerides: 144 mg/dL (ref <150)
VLDL: 29 mg/dL (ref 0–40)

## 2021-06-24 LAB — GLUCOSE, CAPILLARY
Glucose-Capillary: 98 mg/dL (ref 70–99)
Glucose-Capillary: 110 mg/dL — ABNORMAL HIGH (ref 70–99)

## 2021-06-24 LAB — ECHOCARDIOGRAM COMPLETE
Area-P 1/2: 4.68 cm2
S' Lateral: 3.1 cm
Weight: 4081.16 oz

## 2021-06-24 LAB — HEMOGLOBIN A1C
Hgb A1c MFr Bld: 5.8 % — ABNORMAL HIGH (ref 4.8–5.6)
Mean Plasma Glucose: 119.76 mg/dL

## 2021-06-24 LAB — HIV ANTIBODY (ROUTINE TESTING W REFLEX): HIV Screen 4th Generation wRfx: NONREACTIVE

## 2021-06-24 MED ORDER — DIPHENHYDRAMINE-ZINC ACETATE 2-0.1 % EX CREA
TOPICAL_CREAM | Freq: Three times a day (TID) | CUTANEOUS | Status: DC | PRN
  Filled 2021-06-24 (×2): qty 28

## 2021-06-24 MED ORDER — POTASSIUM CHLORIDE CRYS ER 20 MEQ PO TBCR
40.0000 meq | EXTENDED_RELEASE_TABLET | Freq: Once | ORAL | Status: AC
Start: 2021-06-24 — End: 2021-06-24
  Administered 2021-06-24: 40 meq via ORAL
  Filled 2021-06-24: qty 2

## 2021-06-24 MED ORDER — INSULIN ASPART 100 UNIT/ML IJ SOLN: 0.0000 [IU] | Freq: Three times a day (TID) | INTRAMUSCULAR | Status: DC

## 2021-06-24 MED ORDER — ATORVASTATIN CALCIUM 40 MG PO TABS
40.0000 mg | ORAL_TABLET | Freq: Every evening | ORAL | Status: DC
Start: 2021-06-24 — End: 2021-06-26
  Administered 2021-06-24 – 2021-06-25 (×2): 40 mg via ORAL
  Filled 2021-06-24 (×2): qty 1

## 2021-06-24 NOTE — Discharge Instructions (Signed)
? ?                 Intensive Outpatient Programs ? ?[place] [address] [place]     [address] ?[place],  [place], [place] ?[place] ? ?[place]  ?(Inpatient and outpatient)  [phone] (Suboxone and Methadone) ?[address]           ?[phone]          ? ?[facility]: [facility]    [facility] - Intensive Outpatient ?[phone] ?[place], [place] [address]     [place], [place]  ?[place] ? ?[facility] (Outpatient, Inpatient, [facility]  [facility] (Groups and [facility]idental) ?(insurance only) [phone]    [place], [place]   ?       [phone] ? ?     ?[facility] (for caregivers and family) ?[address] ?[place], [place], [place] ?[place]      [phone] ? ?Residential Treatment Programs ? ?[facility]  ?[facility](2 years) Residential: 90 days)  [facility] ([place] ?[address] ?[place], [place], [place] ?[phone]      [phone] or [phone] ? ?[facility] ?[address] ?[place], [place], [place] ?[phone]      [phone] ? ?[place]   [facility] ([facility]) ?[place]     [address] ?[place], [place] [address]     [place], [place] ?[place] ?Admissions: 8am-3pm M-F ? ?[facility]: Residential Program [phone] Days)              [facility]: [facility]  ?[place], [place], [place]  ?[phone] or [phone]    (Walk in Hours over the weekend or by referral) ? ? ?[facility] Crisis: [facility]:[phone] (for crisis  response 24 hours  ?

## 2021-06-24 NOTE — ED Notes (Addendum)
This RN spoke with pt and hooked pt back up after he removed monitoring equipment. This RN convinced pt to stay. Pt's request is to not be woke up if asleep.  ?

## 2021-06-24 NOTE — Assessment & Plan Note (Signed)
--   LDL suboptimally controlled, added atorvastatin 40 mg every evening ?

## 2021-06-24 NOTE — ED Notes (Signed)
Pt ambulated to bathroom 

## 2021-06-24 NOTE — Hospital Course (Addendum)
56 y.o. 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  Patient reports developing generalized weakness around 10 AM today while at an alcoholic treatment facility.  Because of symptoms, per report, patient states he had his blood pressure checked which showed systolic pressures in the 190s.  While symptoms are somewhat vague, patient does report having right-sided facial droop as well as some dysarthria and left-sided weakness.  Patient reports having left lower extremity weakness in the setting of a bulging disks in the past.  Patient denies any significant episodes like this in the past.  Patient does admit to having mild weakness and headaches associated with alcohol as well as polysubstance abuse.  Patient reports taking crack cocaine within the past week or so.  No chest pain.  No abdominal pain.  No vomiting or diarrhea.  Drinks cocktails on a regular basis.  1/2 pack/day smoker.  Has a baseline history of seizure disorder.  Patient reports having seizures associated with alcohol use.  Presented to the ER afebrile, hemodynamically stable.  Code stroke called.  Preliminary imaging including CT head as well as MRI and MRI of the brain are grossly within normal limits apart from a small chronic left opercular infarct.  Labs grossly within normal limits apart from UDS positive for marijuana benzos and cocaine.  Alcohol level within normal limits.  Potassium 3.0.  Case discussed with on-call neurologist Dr. Berline Lopes. Plan for admission with formal in house neuro consult with concern for possible brainstem infarct.  ?

## 2021-06-24 NOTE — ED Notes (Signed)
Pt copntinues  ?

## 2021-06-24 NOTE — Assessment & Plan Note (Signed)
--   screening A1c 5.8% ?-- try diet control first ?-- monitor CBG with sensitive SSI with goal BS in hospital 140-180 ?

## 2021-06-24 NOTE — TOC Initial Note (Signed)
Transition of Care (TOC) - Initial/Assessment Note  ? ? ?Patient Details  ?Name: [name] ?MRN: [id number] ?Date of Birth: 1965-02-13 ? ?Transition of Care (TOC) CM/SW Contact:    ?Lockie Pares, RN ?Phone Number: ?06/24/2021, 11:18 AM ? ?Clinical Narrative:                 ?Patient originally presented to [facility]. ETOH and Illicit drugs. While there they noted extreme hypertension and sent him to the ED. Code stroke as initiated in the ED waiting room for strokelike symptoms. Patient somewhat agitated here, taking monitoring off, threatening to leave AMA.  ?He is under observation and is getting testing, MRI Once cleared he can re-present to [facility] for consideration of Rehabilitation.  ?TOC consult placed TOC will be monitoring patient for needs, recommendations, and transitions. ?PCP suggested in patient instructions, along with SA resources ? ?Expected Discharge Plan: Home/Self Care ?Barriers to Discharge: Financial Resources, Continued Medical Work up, Unsafe home situation ? ? ?Patient Goals and CMS Choice ?Patient states their goals for this hospitalization and ongoing recovery are:: Go to Rehab ?  ?  ? ?Expected Discharge Plan and Services ?Expected Discharge Plan: Home/Self Care ?In-house Referral: Clinical Social Work ?  ?  ?Living arrangements for the past 2 months: Homeless (Living outside in the [place]) ?                ?  ?  ?  ?  ?  ?  ?  ?  ?  ?  ? ?Prior Living Arrangements/Services ?Living arrangements for the past 2 months: Homeless (Living outside in the woods) ?  ?Patient language and need for interpreter reviewed:: Yes ?       ?Need for Family Participation in Patient Care: Yes (Comment) ?Care giver support system in place?: Yes (comment) ?  ?Criminal Activity/Legal Involvement Pertinent to Current Situation/Hospitalization: No - Comment as needed ? ?Activities of Daily Living ?  ?  ? ?Permission Sought/Granted ?  ?  ?   ?   ?   ?   ? ?Emotional Assessment ?   ?Attitude/Demeanor/Rapport: Guarded ?Affect (typically observed): Defensive ?Orientation: : Oriented to Self, Oriented to Place, Oriented to  Time ?Alcohol / Substance Use: Illicit Drugs, Alcohol Use ?Psych Involvement: No (comment) ? ?Admission diagnosis:  CVA (cerebral vascular accident) (HCC) [I63.9] ?Patient Active Problem List  ? Diagnosis Date Noted  ? CVA (cerebral vascular accident) (HCC) 06/23/2021  ? Polysubstance abuse (HCC) 06/23/2021  ? Hypokalemia 06/23/2021  ? Chest pain 10/27/2018  ? Syncope and collapse 10/25/2018  ? Alcohol abuse with intoxication (HCC) 06/12/2016  ? COPD (chronic obstructive pulmonary disease) (HCC) 06/12/2016  ? History of traumatic brain injury 06/12/2016  ? Seizures (HCC) 06/12/2016  ? Endotracheally intubated   ? ?PCP:  Pcp, No ?Pharmacy:   ?[facility] - [place], [place] - [address] ?[address] ? [place] [address] ?Phone: [phone] Fax: [phone] ? ? ? ? ?Social Determinants of Health (SDOH) Interventions ?  ? ?Readmission Risk Interventions ?   ? View : No data to display.  ?  ?  ?  ? ? ? ?

## 2021-06-24 NOTE — Progress Notes (Signed)
*  PRELIMINARY RESULTS* ?Echocardiogram ?2D Echocardiogram has been performed. ? ?Stacey Drain ?06/24/2021, 9:36 AM ?

## 2021-06-24 NOTE — Progress Notes (Addendum)
?PROGRESS NOTE ? ? ?[name]  BZJ:[id number] DOB: 04/16/1965 DOA: 06/23/2021 ?PCP: Pcp, No  ? ?Chief Complaint  ?Patient presents with  ? Code Stroke  ? ?Level of care: Telemetry Medical ? ?Brief Admission History:  ?56 y.o. 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  Patient reports developing generalized weakness around 10 AM today while at an alcoholic treatment facility.  Because of symptoms, per report, patient states he had his blood pressure checked which showed systolic pressures in the 190s.  While symptoms are somewhat vague, patient does report having right-sided facial droop as well as some dysarthria and left-sided weakness.  Patient reports having left lower extremity weakness in the setting of a bulging disks in the past.  Patient denies any significant episodes like this in the past.  Patient does admit to having mild weakness and headaches associated with alcohol as well as polysubstance abuse.  Patient reports taking crack cocaine within the past week or so.  No chest pain.  No abdominal pain.  No vomiting or diarrhea.  Drinks cocktails on a regular basis.  1/2 pack/day smoker.  Has a baseline history of seizure disorder.  Patient reports having seizures associated with alcohol use.  Presented to the ER afebrile, hemodynamically stable.  Code stroke called.  Preliminary imaging including CT head as well as MRI and MRI of the brain are grossly within normal limits apart from a small chronic left opercular infarct.  Labs grossly within normal limits apart from UDS positive for marijuana benzos and cocaine.  Alcohol level within normal limits.  Potassium 3.0.  Case discussed with on-call neurologist Dr. Berline Lopes. Plan for admission with formal in house neuro consult with concern for possible brainstem infarct.  ?  ?Assessment and Plan: ?* CVA (cerebral vascular accident)  (HCC) ?+ R sided facial droop and L sided weakness since 10am today  ?Noted RFs include HTN, polysubstance abuse, tobacco abuse ?S/p code stroke in ER  ?CT head, MRI, MRA head grossly stable.  ?Per neuro recs, some concern for brainstem CVA ?Will send to [facility] where formal neurology rounding is available per recs ?Cont stroke eval including 2D ECHO, carotid dopplers, risk stratification labs  ?Permissive hypertension to 220/110 for 24-48 hrs ?325 mg ASA, followed by 81 mg daily ?300 mg Plavix followed by 75 mg daily  ?Follow up neuro recs in am  ? ?Prediabetes ?-- screening A1c 5.8% ?-- try diet control first ?-- monitor CBG with sensitive SSI with goal BS in hospital 140-180 ? ?Dyslipidemia ?-- LDL suboptimally controlled, added atorvastatin 40 mg every evening ? ?Hypokalemia ?K 3.0 ?Replete  ?Check Mag level  ? ?Polysubstance abuse (HCC) ?Pt self admits to polydrug use including cocaine, ETOH, MJ  ?UDS + for MJ, benzos and cocaine  ?Will place on CIWA protocol  ?Prn ativan  ?Regular ETOH use  ?Follow up ammonia level  ?Thiamine, folate, multivitamin  ? ?Seizures (HCC) ?Stable  ?Continue home Depakote ?Most recent Depakote level subtherapeutic ?Prn ativan in setting of CIWA protocol  ?Follow up neuro recs  ? ?COPD (chronic obstructive pulmonary disease) (HCC) ?Stable from a resp standpoint  ?Cont home inhalers ?Follow  ? ?Alcohol abuse with intoxication (HCC) ?+ ETOH abuse  ?ETOH level WNL  ?Start CIWA protocol  ?Thiamine, folate, multivitamin  ?-TOC consulted for SA resources  ? ?DVT prophylaxis: enoxaparin  ?Code Status: full  ?Family Communication:  ?Disposition: Status is: Inpatient ?Remains inpatient  appropriate because: needs inpatient neurology consult, IV fluid required  ?  ?Consultants:  ?Neurology  ? ?Procedures:  ? ?Antimicrobials:  ?  ?Subjective: ?Pt reports that he still has weakness on left upper extremity, left hand grip  ?Objective: ?Vitals:  ? 06/24/21 1400 06/24/21 1415 06/24/21 1500 06/24/21 1515   ?BP:      ?Pulse: 79 81 74 69  ?Resp: 17 17 16 18   ?Temp:      ?TempSrc:      ?SpO2: 98% 96% 97% 97%  ?Weight:      ? ?No intake or output data in the 24 hours ending 06/24/21 1524 ?Filed Weights  ? 06/23/21 1726  ?Weight: 115.7 kg  ? ?Examination: ? ?General exam: Appears calm and comfortable  ?Respiratory system: Clear to auscultation. Respiratory effort normal. ?Cardiovascular system: normal S1 & S2 heard. No JVD, murmurs, rubs, gallops or clicks. No pedal edema. ?Gastrointestinal system: Abdomen is nondistended, soft and nontender. No organomegaly or masses felt. Normal bowel sounds heard. ?Central nervous system: Alert and oriented. 4/5 LUE/LLE, 5/5 RLE, RUE. ?Extremities: Symmetric 5 x 5 power. ?Skin: No rashes, lesions or ulcers. ?Psychiatry: Judgement and insight appear poor. Mood & affect appropriate.  ? ?Data Reviewed: I have personally reviewed following labs and imaging studies ? ?CBC: ?Recent Labs  ?Lab 06/21/21 ?0111 06/23/21 ?1726 06/23/21 ?1738  ?WBC 6.0 7.6  --   ?NEUTROABS 2.6 4.5  --   ?HGB 16.2 15.8 16.0  ?HCT 45.9 43.9 47.0  ?MCV 90.5 89.6  --   ?PLT 230 242  --   ? ? ?Basic Metabolic Panel: ?Recent Labs  ?Lab 06/21/21 ?0111 06/23/21 ?1726 06/23/21 ?1738  ?NA 138 138 137  ?K 3.4* 3.0* 3.1*  ?CL 105 106 102  ?CO2 20* 23  --   ?GLUCOSE 118* 114* 113*  ?BUN 12 10 8   ?CREATININE 0.80 0.90 0.80  ?CALCIUM 8.6* 9.0  --   ? ? ?CBG: ?Recent Labs  ?Lab 06/21/21 ?0106 06/23/21 ?1720  ?GLUCAP 119* 122*  ? ? ?Recent Results (from the past 240 hour(s))  ?Resp Panel by RT-PCR (Flu A&B, Covid) Nasopharyngeal Swab     Status: None  ? Collection Time: 06/23/21  5:41 PM  ? Specimen: Nasopharyngeal Swab; Nasopharyngeal(NP) swabs in vial transport medium  ?Result Value Ref Range Status  ? SARS Coronavirus 2 by RT PCR NEGATIVE NEGATIVE Final  ?  Comment: (NOTE) ?SARS-CoV-2 target nucleic acids are NOT DETECTED. ? ?The SARS-CoV-2 RNA is generally detectable in upper respiratory ?specimens during the acute phase of  infection. The lowest ?concentration of SARS-CoV-2 viral copies this assay can detect is ?138 copies/mL. A negative result does not preclude SARS-Cov-2 ?infection and should not be used as the sole basis for treatment or ?other patient management decisions. A negative result may occur with  ?improper specimen collection/handling, submission of specimen other ?than nasopharyngeal swab, presence of viral mutation(s) within the ?areas targeted by this assay, and inadequate number of viral ?copies(<138 copies/mL). A negative result must be combined with ?clinical observations, patient history, and epidemiological ?information. The expected result is Negative. ? ?Fact Sheet for Patients:  ?08/23/21 ? ?Fact Sheet for Healthcare Providers:  ?08/23/21 ? ?This test is no t yet approved or cleared by the [employer] FDA and  ?has been authorized for detection and/or diagnosis of SARS-CoV-2 by ?FDA under an Emergency Use Authorization (EUA). This EUA will remain  ?in effect (meaning this test can be used) for the duration of the ?COVID-19 declaration under Section 564(b)(1)  of the Act, 21 ?U.S.C.section 360bbb-3(b)(1), unless the authorization is terminated  ?or revoked sooner.  ? ? ?  ? Influenza A by PCR NEGATIVE NEGATIVE Final  ? Influenza B by PCR NEGATIVE NEGATIVE Final  ?  Comment: (NOTE) ?The Xpert Xpress SARS-CoV-2/FLU/RSV plus assay is intended as an aid ?in the diagnosis of influenza from Nasopharyngeal swab specimens and ?should not be used as a sole basis for treatment. Nasal washings and ?aspirates are unacceptable for Xpert Xpress SARS-CoV-2/FLU/RSV ?testing. ? ?Fact Sheet for Patients: ?[employer]logger[employer].comhttps://www.fda.gov/media/152166/download ? ?Fact Sheet for Healthcare Providers: ?SeriousBroker.ithttps://www.fda.gov/media/152162/download ? ?This test is not yet approved or cleared by the [place] FDA and ?has been authorized for detection and/or diagnosis of SARS-CoV-2  by ?FDA under an Emergency Use Authorization (EUA). This EUA will remain ?in effect (meaning this test can be used) for the duration of the ?COVID-19 declaration under Section 564(b)(1) of the Act, 21 U.S.C. ?

## 2021-06-25 DIAGNOSIS — G510 Bell's palsy: Secondary | ICD-10-CM

## 2021-06-25 LAB — BASIC METABOLIC PANEL
Anion gap: 9 (ref 5–15)
BUN: 5 mg/dL — ABNORMAL LOW (ref 6–20)
CO2: 24 mmol/L (ref 22–32)
Calcium: 8.7 mg/dL — ABNORMAL LOW (ref 8.9–10.3)
Chloride: 106 mmol/L (ref 98–111)
Creatinine, Ser: 0.77 mg/dL (ref 0.61–1.24)
GFR, Estimated: >60 mL/min (ref >60)
Glucose, Bld: 98 mg/dL (ref 70–99)
Potassium: 3.6 mmol/L (ref 3.5–5.1)
Sodium: 139 mmol/L (ref 135–145)

## 2021-06-25 LAB — GLUCOSE, CAPILLARY
Glucose-Capillary: 100 mg/dL — ABNORMAL HIGH (ref 70–99)
Glucose-Capillary: 116 mg/dL — ABNORMAL HIGH (ref 70–99)
Glucose-Capillary: 106 mg/dL — ABNORMAL HIGH (ref 70–99)

## 2021-06-25 LAB — MAGNESIUM: Magnesium: 2.1 mg/dL (ref 1.7–2.4)

## 2021-06-25 MED ORDER — ACETAMINOPHEN 325 MG PO TABS: 650.0000 mg | ORAL_TABLET | ORAL | Status: DC | PRN

## 2021-06-25 MED ORDER — DIVALPROEX SODIUM ER 500 MG PO TB24
500.0000 mg | ORAL_TABLET | Freq: Three times a day (TID) | ORAL | Status: DC
  Administered 2021-06-25 – 2021-06-26 (×4): 500 mg via ORAL
  Filled 2021-06-25 (×4): qty 1

## 2021-06-25 MED ORDER — CHLORDIAZEPOXIDE HCL 25 MG PO CAPS
25.0000 mg | ORAL_CAPSULE | Freq: Four times a day (QID) | ORAL | Status: AC
  Administered 2021-06-25 – 2021-06-26 (×4): 25 mg via ORAL
  Filled 2021-06-25 (×4): qty 1

## 2021-06-25 MED ORDER — OXYCODONE HCL 5 MG PO TABS
5.0000 mg | ORAL_TABLET | ORAL | Status: DC | PRN
  Administered 2021-06-25 (×2): 10 mg via ORAL
  Filled 2021-06-25: qty 1
  Filled 2021-06-25: qty 2
  Filled 2021-06-25: qty 1
  Filled 2021-06-25: qty 2

## 2021-06-25 MED ORDER — TRAMADOL HCL 50 MG PO TABS
50.0000 mg | ORAL_TABLET | Freq: Four times a day (QID) | ORAL | Status: DC | PRN
  Administered 2021-06-25: 50 mg via ORAL
  Filled 2021-06-25: qty 1

## 2021-06-25 MED ORDER — PREDNISONE 20 MG PO TABS
60.0000 mg | ORAL_TABLET | Freq: Every day | ORAL | Status: DC
  Administered 2021-06-26: 60 mg via ORAL
  Filled 2021-06-25 (×2): qty 3

## 2021-06-25 MED ORDER — TRAMADOL HCL 50 MG PO TABS: 50.0000 mg | ORAL_TABLET | Freq: Four times a day (QID) | ORAL | Status: DC | PRN

## 2021-06-25 MED ORDER — HALOPERIDOL LACTATE 5 MG/ML IJ SOLN
2.0000 mg | Freq: Once | INTRAMUSCULAR | Status: AC
  Administered 2021-06-25: 2 mg via INTRAVENOUS
  Filled 2021-06-25: qty 1

## 2021-06-25 MED ORDER — MORPHINE SULFATE (PF) 2 MG/ML IV SOLN
2.0000 mg | INTRAVENOUS | Status: DC | PRN
  Administered 2021-06-25 – 2021-06-26 (×4): 2 mg via INTRAVENOUS
  Filled 2021-06-25 (×4): qty 1

## 2021-06-25 MED ORDER — ACETAMINOPHEN 500 MG PO TABS: 500.0000 mg | ORAL_TABLET | ORAL | Status: DC | PRN

## 2021-06-25 MED ORDER — VALACYCLOVIR HCL 500 MG PO TABS
1000.0000 mg | ORAL_TABLET | Freq: Three times a day (TID) | ORAL | Status: DC
  Administered 2021-06-25 – 2021-06-26 (×3): 1000 mg via ORAL
  Filled 2021-06-25 (×3): qty 2

## 2021-06-25 MED ORDER — CHLORDIAZEPOXIDE HCL 25 MG PO CAPS
25.0000 mg | ORAL_CAPSULE | Freq: Three times a day (TID) | ORAL | Status: DC
  Filled 2021-06-25: qty 1

## 2021-06-25 MED ORDER — CHLORDIAZEPOXIDE HCL 25 MG PO CAPS: 25.0000 mg | ORAL_CAPSULE | Freq: Two times a day (BID) | ORAL | Status: DC

## 2021-06-25 MED ORDER — HALOPERIDOL LACTATE 5 MG/ML IJ SOLN: 2.0000 mg | Freq: Four times a day (QID) | INTRAMUSCULAR | Status: DC | PRN

## 2021-06-25 NOTE — Evaluation (Signed)
Speech Language Pathology Evaluation ?Patient Details ?Name: [name] ?MRN: [id number] ?DOB: 1965/10/01 ?Today's Date: 06/25/2021 ?Time: [id number] ?SLP Time Calculation (min) (ACUTE ONLY): 18 min ? ?Problem List:  ?Patient Active Problem List  ? Diagnosis Date Noted  ? Dyslipidemia 06/24/2021  ? Prediabetes 06/24/2021  ? CVA (cerebral vascular accident) ([place]) 06/23/2021  ? Polysubstance abuse ([place]) 06/23/2021  ? Hypokalemia 06/23/2021  ? Chest pain 10/27/2018  ? Syncope and collapse 10/25/2018  ? Alcohol abuse with intoxication ([place]) 06/12/2016  ? COPD (chronic obstructive pulmonary disease) ([place]) 06/12/2016  ? History of traumatic brain injury 06/12/2016  ? Seizures ([place]) 06/12/2016  ? Endotracheally intubated   ? ?Past Medical History:  ?Past Medical History:  ?Diagnosis Date  ? Arthritis   ? Asthma   ? Chronic neck pain   ? COPD (chronic obstructive pulmonary disease) ([place])   ? Injury of jawline   ? Mental disorder   ? Ruptured disc, cervical   ? Seizures (HCC)   ? Torn rotator cuff   ? ?Past Surgical History: History reviewed. No pertinent surgical history. ?HPI:  ?56 y.o. male presenting [id number] from alcoholic treatment facility with CVA. Preliminary imaging including CT head as well as MRI and MRI of the brain are grossly within normal limits. Neuro recommended admission for ?brainstem infarct. PMH significant of COPD, polysubstance abuse, traumatic brain injury, seizure disorder status post traumatic brain injury  ? ?Assessment / Plan / Recommendation ?Clinical Impression ? Patient presents with deficits in the areas of short term memory and advanced reasoning/problem solving skills as well as a mild dysarthria due to right sided facial weakness.  Right facial droop new with acute CVA. Per patient, short term memory impaired since TBI in 2018. 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  He reported that he feels that these cognitive skills are worse however since this admission but states that anxiety is likely impacting. He was able to verbalize appropriate judgement and safety awareness however shortly before today's evaluation, patient attempted to leave AMA stating that he was having a "panick attack."  Patient verbalizing desire to participate in rehab. Encouraged patient to remain in hospital until medically ready for discharge. He will benefit from acute SLP f/u for dysarthria and cognition (may be worse from baseline) and likely f/u after d/c. ?   ?SLP Assessment ? SLP Recommendation/Assessment: Patient needs continued [facility] ?SLP Visit Diagnosis: Dysarthria and anarthria (R47.1);Frontal lobe and executive function deficit ?Frontal lobe and executive function deficit following: Cerebral infarction  ?  ?Recommendations for follow up therapy are one component of a multi-disciplinary discharge planning process, led by the attending physician.  Recommendations may be updated based on patient status, additional functional criteria and insurance authorization. ?   ?Follow Up Recommendations ? Outpatient SLP (vs home health)  ?  ?Assistance Recommended at Discharge ? None  ?   ?Frequency and Duration min 1 x/week  ?2 weeks ?  ?   ?SLP Evaluation ?Cognition ? Overall Cognitive Status: No family/caregiver present to determine baseline cognitive functioning ?Arousal/Alertness: Awake/alert ?Orientation Level: Oriented X4 ?Memory: Impaired ?Memory Impairment: Storage deficit;Retrieval deficit;Decreased short term memory ?Decreased Short Term Memory: Verbal complex;Functional complex;Verbal basic ?Awareness: Appears intact ?Problem Solving: Appears intact ?Executive Function: Reasoning (mathmatical reasoning impaired) ?Behaviors: Impulsive (tried to leave AMA shortly before evaluation, panick attack) ?Safety/Judgment:  (see impression statement)  ?  ?   ?  Comprehension ?  Auditory Comprehension ?Overall Auditory Comprehension: Appears within functional limits for tasks assessed ?Visual Recognition/Discrimination ?Discrimination: Within Function Limits ?Reading Comprehension ?Reading Status: Not tested  ?  ?Expression Expression ?Primary Mode of Expression: Verbal ?Verbal Expression ?Overall Verbal Expression: Appears within functional limits for tasks assessed ?Written Expression ?Dominant Hand: Right   ?Oral / Motor ? Oral Motor/Sensory Function ?Overall Oral Motor/Sensory Function: Moderate impairment ?Facial ROM: Reduced right;Suspected CN VII (facial) dysfunction ?Facial Symmetry: Abnormal symmetry right;Suspected CN VII (facial) dysfunction ?Facial Strength: Reduced right;Suspected CN VII (facial) dysfunction ?Facial Sensation: Within Functional Limits ?Lingual ROM: Within Functional Limits ?Lingual Symmetry: Within Functional Limits ?Lingual Strength: Within Functional Limits ?Lingual Sensation: Within Functional Limits ?Velum: Within Functional Limits ?Mandible: Within Functional Limits ?Motor Speech ?Overall Motor Speech: Impaired ?Respiration: Within functional limits ?Phonation: Normal ?Resonance: Within functional limits ?Articulation: Impaired ?Level of Impairment: Word ?Intelligibility: Intelligibility reduced ?Word: 75-100% accurate ?Phrase: 75-100% accurate ?Sentence: 75-100% accurate ?Conversation: 75-100% accurate ?Motor Planning: Witnin functional limits ?Effective Techniques: Over-articulate;Slow rate   ?        ?Juliya Magill MA, CCC-SLP ? ?[name] ?06/25/2021, 11:36 AM ? ?

## 2021-06-25 NOTE — Progress Notes (Signed)
Patient returned to room and is being examined by MD; tx plan discussed. ?

## 2021-06-25 NOTE — Progress Notes (Addendum)
Patient's request for pain medication and verbalizing increased anxiety and impatience to see the doctor were addressed at 0951.  At 1015 patient went off the unit, when approached by unit director the patient stated, "Back off" and went onto an elevator. Unwilling to return to the unit. Will not wait to discuss or sign AMA paperwork. Dr. Thereasa Solo was called and communicated with regarding this patient. Notified patient's mother that he left the unit. ?

## 2021-06-25 NOTE — Progress Notes (Addendum)
STROKE TEAM PROGRESS NOTE  ? ?INTERVAL HISTORY ?No family at the bedside. MRA head shows small chronic left opercular infarct but no acute infarct.  ?Seen in [facility] ED on 5/10 via EMS for seizure like activity- received versed and ativan in the ED and then was discharged the next day. EEG ordered.  ?Patient states that he has a hx of panic attacks and bipolar disorder. He uses xanax at home for his anxiety. His father recently passed away which has contributed significantly to his increased anxiety and drug/alcohol use. Noncompliance with medications for approximately 1 month. ?He is motivated to quite using and has requested assistance/discharge to a treatment facility.  ?Symptoms are consistent with Bells Palsy, prednisone and valtrex ordered.  ? ?Vitals:  ? 06/24/21 1927 06/24/21 2100 06/24/21 2318 06/25/21 0546  ?BP: 120/74 120/82 (!) 105/59 132/87  ?Pulse: 70   75  ?Resp: (!) 22 20 18 16   ?Temp: 98.6 ?F (37 ?C) 97.8 ?F (36.6 ?C) 98.7 ?F (37.1 ?C) 98.2 ?F (36.8 ?C)  ?TempSrc: Oral Oral Oral Oral  ?SpO2: 98% 99% 97% 100%  ?Weight:      ? ?CBC:  ?Recent Labs  ?Lab 06/21/21 ?0111 06/23/21 ?1726 06/23/21 ?1738  ?WBC 6.0 7.6  --   ?NEUTROABS 2.6 4.5  --   ?HGB 16.2 15.8 16.0  ?HCT 45.9 43.9 47.0  ?MCV 90.5 89.6  --   ?PLT 230 242  --   ? ?Basic Metabolic Panel:  ?Recent Labs  ?Lab 06/21/21 ?0111 06/23/21 ?1726 06/23/21 ?1738  ?NA 138 138 137  ?K 3.4* 3.0* 3.1*  ?CL 105 106 102  ?CO2 20* 23  --   ?GLUCOSE 118* 114* 113*  ?BUN 12 10 8   ?CREATININE 0.80 0.90 0.80  ?CALCIUM 8.6* 9.0  --   ? ?Lipid Panel:  ?Recent Labs  ?Lab 06/24/21 ?0748  ?CHOL 193  ?TRIG 144  ?HDL 43  ?CHOLHDL 4.5  ?VLDL 29  ?LDLCALC 121*  ? ?HgbA1c:  ?Recent Labs  ?Lab 06/23/21 ?1726  ?HGBA1C 5.8*  ? ?Urine Drug Screen:  ?Recent Labs  ?Lab 06/23/21 ?1726  ?LABOPIA NONE DETECTED  ?COCAINSCRNUR POSITIVE*  ?LABBENZ POSITIVE*  ?AMPHETMU NONE DETECTED  ?THCU POSITIVE*  ?LABBARB NONE DETECTED  ?  ?Alcohol Level  ?Recent Labs  ?Lab 06/23/21 ?1726  ?ETH <10   ? ? ?IMAGING past 24 hours ?08/23/21 Carotid Bilateral ? ?Result Date: 06/24/2021 ?CLINICAL DATA:  56 year old male with history of stroke. EXAM: BILATERAL CAROTID DUPLEX ULTRASOUND TECHNIQUE: 06/26/2021 scale imaging, color Doppler and duplex ultrasound were performed of bilateral carotid and vertebral arteries in the neck. COMPARISON:  None Available. FINDINGS: Criteria: Quantification of carotid stenosis is based on velocity parameters that correlate the residual internal carotid diameter with NASCET-based stenosis levels, using the diameter of the distal internal carotid lumen as the denominator for stenosis measurement. The following velocity measurements were obtained: RIGHT ICA: Peak systolic velocity 81 cm/sec, End diastolic velocity 32 cm/sec CCA: Peak systolic velocity 101 cm/sec SYSTOLIC ICA/CCA RATIO:  0.8 ECA: Peak systolic velocity 149 cm/sec LEFT ICA: Peak systolic velocity 90 cm/sec, End diastolic velocity 30 cm/sec CCA: 113 cm/sec SYSTOLIC ICA/CCA RATIO:  0.8 ECA: 129 cm/sec RIGHT CAROTID ARTERY: Mild multifocal atherosclerotic plaque formation, most prominent in the carotid bulb. No significant tortuosity. Normal low resistance waveforms. RIGHT VERTEBRAL ARTERY:  Antegrade flow. LEFT CAROTID ARTERY: Mild multifocal atherosclerotic plaque formation the most prominent the carotid bulb. No significant tortuosity. Normal low resistance waveforms. LEFT VERTEBRAL ARTERY:  Antegrade flow. Upper extremity non-invasive  blood pressures: Not obtained. IMPRESSION: 1. Right carotid artery system: Less than 50% stenosis secondary to mild multifocal atherosclerotic plaque formation. 2. Left carotid artery system: Less than 50% stenosis secondary to mild multifocal atherosclerotic plaque formation. 3.  Vertebral artery system: Patent with antegrade flow bilaterally. Marliss Cootsylan Suttle, MD [facility] Electronically Signed   By: Marliss Cootsylan  Suttle M.D.   On: 06/24/2021 11:30   ? ?ECHOCARDIOGRAM COMPLETE ? ?Result Date: 06/24/2021 ?   ECHOCARDIOGRAM REPORT   Patient Name:   Clifford LocusSTEVEN L Morris Date of Exam: 06/24/2021 Medical Rec #:  [id number]      Height:       73.0 in Accession #:    [id number]     Weight:       255.1 lb Date of Birth:  [birth year]      BSA:          2.386 m? Patient Age:    55 years       BP:           116/85 mmHg Patient Gender: M              HR:           70 bpm. [facility] Procedure: 2D Echo, Cardiac Doppler and Color Doppler Indications:    I63.9 Stroke  History:        Patient has prior history of Echocardiogram examinations, most                 recent 10/26/2018. COPD and Stroke; Signs/Symptoms:Chest Pain.                 Alcohol and Polysubstance abuse.  Sonographer:    Celesta GentileBernard White RCS Referring Phys: [id number] Kwame J [place] IMPRESSIONS  1. Left ventricular ejection fraction, by estimation, is 55 to 60%. The left ventricle has normal function. The left ventricle has no regional wall motion abnormalities. Left ventricular diastolic parameters are consistent with Grade I diastolic dysfunction (impaired relaxation).  2. Right ventricular systolic function is normal. The right ventricular size is normal. Tricuspid regurgitation signal is inadequate for assessing PA pressure.  3. The mitral valve is grossly normal. No evidence of mitral valve regurgitation.  4. The aortic valve is tricuspid. There is mild calcification of the aortic valve. There is mild thickening of the aortic valve. Aortic valve regurgitation is not visualized. Aortic valve sclerosis is present, with no evidence of aortic valve stenosis.  5. The inferior vena cava is normal in size with greater than 50% respiratory variability, suggesting right atrial pressure of 3 mmHg. Comparison(s): Prior images reviewed side by side. Aortic valve is abnormally thickened for age but stable from 10/26/2018 study. FINDINGS  Left Ventricle: Left ventricular ejection fraction, by estimation, is 55 to 60%. The  left ventricle has normal function. The left ventricle has no regional wall motion abnormalities. The left ventricular internal cavity size was normal in size. There is  no left ventricular hypertrophy. Left ventricular diastolic parameters are consistent with Grade I diastolic dysfunction (impaired relaxation). Right Ventricle: The right ventricular size is normal. No increase in right ventricular wall thickness. Right ventricular systolic function is normal. Tricuspid regurgitation signal is inadequate for assessing PA pressure. Left Atrium: Left atrial size was normal in size. Right Atrium: Right atrial size was normal in size. Pericardium: Trivial pericardial effusion is present. Presence of epicardial fat layer. Mitral Valve: The mitral valve is grossly normal. No evidence of mitral valve regurgitation.  Tricuspid Valve: The tricuspid valve is normal in structure. Tricuspid valve regurgitation is trivial. No evidence of tricuspid stenosis. Aortic Valve: The aortic valve is tricuspid. There is mild calcification of the aortic valve. There is mild thickening of the aortic valve. Aortic valve regurgitation is not visualized. Aortic valve sclerosis is present, with no evidence of aortic valve stenosis. Pulmonic Valve: The pulmonic valve was grossly normal. Pulmonic valve regurgitation is not visualized. No evidence of pulmonic stenosis. Aorta: The aortic root is normal in size and structure. Venous: The inferior vena cava is normal in size with greater than 50% respiratory variability, suggesting right atrial pressure of 3 mmHg. IAS/Shunts: No atrial level shunt detected by color flow Doppler.  LEFT VENTRICLE PLAX 2D LVIDd:         4.60 cm   Diastology LVIDs:         3.10 cm   LV e' medial:    7.29 cm/s LV PW:         1.00 cm   LV E/e' medial:  9.5 LV IVS:        0.90 cm   LV e' lateral:   9.14 cm/s LVOT diam:     2.10 cm   LV E/e' lateral: 7.5 LV SV:         72 LV SV Index:   30 LVOT Area:     3.46 cm?  RIGHT  VENTRICLE RV S prime:     11.40 cm/s TAPSE (M-mode): 1.9 cm LEFT ATRIUM             Index        RIGHT ATRIUM           Index LA diam:        3.00 cm 1.26 cm/m?   RA Area:     17.00 cm? LA Vol (A2C):   54.1 ml 22.6

## 2021-06-25 NOTE — Progress Notes (Addendum)
? ?GOLDEN MULDERIG  [id number] DOB: 1965-05-11 DOA: 06/23/2021 ?PCP: Pcp, No   ? ?Brief Narrative:  ?56 year old with a history of COPD, polysubstance abuse (EtOH, tobacco, crack), TBI, and seizure disorder who was transported from an alcohol treatment facility to the [facility] ER 5/12 after the acute onset of generalized weakness as well as possible right-sided facial droop, right vision changes, left arm and leg numbness/weakness, and dysarthria.  In the ER a code stroke was called and CT head was unrevealing.  MRI of the brain revealed only a small chronic left opercular infarct but no acute findings.  UDS was positive for marijuana, benzodiazepines, and cocaine. ? ?Consultants:  ?Neurology ? ?Goals of Care:  Code Status: Full Code  ? ?DVT prophylaxis: ?Lovenox ? ?Interim Hx: ?No needs identified by PT/OT evaluations.  At the time of my visit the patient has left the floor AMA, and then returned to his room.  He is very anxious.  He is alert and conversant but mildly tremulous and slightly agitated.  He denies chest pain or shortness of breath.  He tells me he feels extremely anxious and tremulous. ? ?Assessment & Plan: ? ?Suspected right Bell's palsy - CVA ruled out  ?CT head, MRI, MRA w/o acute findings  ?Neuro initially concerned for MRI negative brainstem CVA, but on f/u exam and hx feel this is most c/w a Bell's Palsy - to begin prednisone and valtrex today ?-TTE notes EF 55-60% with normal LV function and no WMA with grade 1 DD, no significant valve disorders, and no evidence of an atrial level shunt ?-B carotid Dopplers without evidence of significant stenosis and with antegrade vertebral flow bilaterally ?-cont ASA alone but in absence of CVA no indication for Plavix  ? ?Polysubstance abuse  ?Continue thiamine, folate, multivitamin  ? ?Alcoholism w/ alcohol withdrawal ?No acute delirium, but pt is severely anxious, agitated, and mildly tremulous - begin scheduled librium taper, and adjust ativan CIWA  protocol  ? ?Prediabetes ?A1c 5.8 ? ?Dyslipidemia ?LDL suboptimally controlled - added atorvastatin 40 mg  ? ?Hypokalemia ?Likely due to poor nutrition in setting of alcoholism -supplement and follow -check magnesium ? ?Seizures ?Stable - continue home Depakote ? ?COPD  ?Stable at present ? ?Family Communication: No family present at time of exam ?Disposition: From home -is interested in residential alcohol rehab facility when medically cleared ? ? ?Objective: ?Blood pressure 120/85, pulse 78, temperature 97.9 ?F (36.6 ?C), temperature source Oral, resp. rate 14, weight 115.7 kg, SpO2 98 %. ? ?Intake/Output Summary (Last 24 hours) at 06/25/2021 0929 ?Last data filed at 06/24/2021 1811 ?Gross per 24 hour  ?Intake 1002.19 ml  ?Output 500 ml  ?Net 502.19 ml  ? ?Filed Weights  ? 06/23/21 1726  ?Weight: 115.7 kg  ? ? ?Examination: ?General: No acute respiratory distress -alert conversant and oriented but very anxious and slightly tremulous ?Lungs: Clear to auscultation bilaterally without wheezes or crackles ?Cardiovascular: Tachycardic but regular without murmur ?Abdomen: Nontender, nondistended, soft, bowel sounds positive, no rebound, no ascites, no appreciable mass ?Extremities: No significant cyanosis, clubbing, or edema bilateral lower extremities ? ?CBC: ?Recent Labs  ?Lab 06/21/21 ?0111 06/23/21 ?1726 06/23/21 ?1738  ?WBC 6.0 7.6  --   ?NEUTROABS 2.6 4.5  --   ?HGB 16.2 15.8 16.0  ?HCT 45.9 43.9 47.0  ?MCV 90.5 89.6  --   ?PLT 230 242  --   ? ?Basic Metabolic Panel: ?Recent Labs  ?Lab 06/21/21 ?0111 06/23/21 ?1726 06/23/21 ?1738  ?NA 138 138  137  ?K 3.4* 3.0* 3.1*  ?CL 105 106 102  ?CO2 20* 23  --   ?GLUCOSE 118* 114* 113*  ?BUN 12 10 8   ?CREATININE 0.80 0.90 0.80  ?CALCIUM 8.6* 9.0  --   ? ?GFR: ?Estimated Creatinine Clearance: 139 mL/min (by C-G formula based on SCr of 0.8 mg/dL). ? ?Liver Function Tests: ?Recent Labs  ?Lab 06/21/21 ?0111 06/23/21 ?1726  ?AST 29 30  ?ALT 32 31  ?ALKPHOS 78 78  ?BILITOT 0.5 0.9   ?PROT 7.9 8.0  ?ALBUMIN 4.1 4.3  ? ?Recent Labs  ?Lab 06/23/21 ?2207  ?AMMONIA 21  ? ? ?HbA1C: ?Hgb A1c MFr Bld  ?Date/Time Value Ref Range Status  ?06/23/2021 05:26 PM 5.8 (H) 4.8 - 5.6 % Final  ?  Comment:  ?  (NOTE) ?Pre diabetes:          5.7%-6.4% ? ?Diabetes:              >6.4% ? ?Glycemic control for   <7.0% ?adults with diabetes ?  ?06/12/2016 03:51 PM 5.3 4.8 - 5.6 % Final  ?  Comment:  ?  (NOTE) ?        Pre-diabetes: 5.7 - 6.4 ?        Diabetes: >6.4 ?        Glycemic control for adults with diabetes: <7.0 ?  ? ? ? ?Scheduled Meds: ?  stroke: early stages of recovery book   Does not apply Once  ? aspirin EC  81 mg Oral Daily  ? atorvastatin  40 mg Oral QPM  ? clopidogrel  75 mg Oral Daily  ? divalproex  500 mg Oral TID  ? enoxaparin (LOVENOX) injection  40 mg Subcutaneous [id number]  ? folic acid  1 mg Oral Daily  ? insulin aspart  0-6 Units Subcutaneous TID WC  ? multivitamin with minerals  1 tablet Oral Daily  ? thiamine  100 mg Oral Daily  ? Or  ? thiamine  100 mg Intravenous Daily  ? ? ? LOS: 2 days  ? ?Cherene Altes, MD ?[facility] ?Office  [phone] ?Pager - Text Page per Shea Evans ? ?If 7PM-7AM, please contact night-coverage per Amion ?06/25/2021, 9:29 AM ? ? ? ? ?

## 2021-06-25 NOTE — Progress Notes (Signed)
Cooperative through the afternoon; patient showered; medications given per orders; no safety issues presently. ?

## 2021-06-25 NOTE — Progress Notes (Signed)
Placed all of the EEG leads, started the study, was helping pt to the chair to get more comfortable and he grabbed the bundle of electrodes and pulled off the EEG leads to decline the study. Notified RN (in the room) to ask the doctor to please cancel EEG order  ?

## 2021-06-25 NOTE — Evaluation (Signed)
Physical Therapy Evaluation and Discharge ?Patient Details ?Name: [name] ?MRN: [id number] ?DOB: [birth year] ?Today's Date: 06/25/2021 ? ?History of Present Illness ? 56 y.o. male presenting [id number] from alcoholic treatment facility with CVA. Preliminary imaging including CT head as well as MRI and MRI of the brain are grossly within normal limits. Neuro recommended admission for ?brainstem infarct. PMH significant of COPD, polysubstance abuse, traumatic brain injury, seizure disorder status post traumatic brain injury  ?Clinical Impression ?  ?Patient evaluated by Physical Therapy with no further acute PT needs identified. Patient initially in a lot of back pain due to soft mattress and wanting to get up and walk. Initially using RW with repeated cues for safe use--ultimately did better with no device and more upright posture. Independently walking in hall at end of session with RN aware.  PT is signing off. Thank you for this referral. ?   ?   ? ?Recommendations for follow up therapy are one component of a multi-disciplinary discharge planning process, led by the attending physician.  Recommendations may be updated based on patient status, additional functional criteria and insurance authorization. ? ?Follow Up Recommendations No PT follow up ? ?  ?Assistance Recommended at Discharge None  ?Patient can return home with the following ?   ? ?  ?Equipment Recommendations None recommended by PT  ?Recommendations for Other Services ?    ?  ?Functional Status Assessment Patient has not had a recent decline in their functional status  ? ?  ?Precautions / Restrictions Precautions ?Precautions: Fall ?Precaution Comments: reports left knee has given out in the past due to back pain  ? ?  ? ?Mobility ? Bed Mobility ?Overal bed mobility: Independent ?  ?  ?  ?  ?  ?  ?  ?  ? ?Transfers ?Overall transfer level: Needs assistance ?Equipment used: 1 person hand held assist, Rolling walker (2 wheels), None ?Transfers: Sit  to/from Stand ?Sit to Stand: Min assist, Independent ?  ?  ?  ?  ?  ?General transfer comment: pt initially impulsive trying to get to the bathroom; afterwards, more calm and following instructions and back feeling better ?  ? ?Ambulation/Gait ?Ambulation/Gait assistance: Min assist, Independent ?Gait Distance (Feet): 800 Feet ?Assistive device: None, 1 person hand held assist, Rolling walker (2 wheels) ?Gait Pattern/deviations: Step-through pattern, Trunk flexed ?  ?Gait velocity interpretation: >2.62 ft/sec, indicative of community ambulatory ?  ?General Gait Details: initially back stiff and in flexed posture with HHA to bathroom; progressed to RW as exited bathroom; progressed to no device and walking independently in hall ? ?Stairs ?  ?  ?  ?  ?  ? ?Wheelchair Mobility ?  ? ?Modified Rankin (Stroke Patients Only) ?Modified Rankin (Stroke Patients Only) ?Pre-Morbid Rankin Score: No symptoms ?Modified Rankin: No significant disability (has facial weakness; can't whistle) ? ?  ? ?Balance Overall balance assessment: Mild deficits observed, not formally tested ?  ?  ?  ?  ?  ?  ?  ?  ?  ?  ?  ?  ?  ?  ?  ?  ?  ?  ?   ? ? ? ?Pertinent Vitals/Pain Pain Assessment ?Pain Assessment: 0-10 ?Pain Score: 8  ?Pain Location: back ?Pain Descriptors / Indicators: Aching, Discomfort, Grimacing, Guarding ?Pain Intervention(s): Limited activity within patient's tolerance, Monitored during session, Repositioned, Patient requesting pain meds-RN notified  ? ? ?Home Living Family/patient expects to be discharged to:: Other (Comment) ?  ?  ?  ?  ?  ?  ?  ?  ?  ?  Additional Comments: alcohol detox  ?  ?Prior Function Prior Level of Function : Independent/Modified Independent ?  ?  ?  ?  ?  ?  ?Mobility Comments: at times uses cane when back really sore ?  ?  ? ? ?Hand Dominance  ?   ? ?  ?Extremity/Trunk Assessment  ? Upper Extremity Assessment ?Upper Extremity Assessment: Overall WFL for tasks assessed (donned belt, donned and tied  shoes; denied changes) ?  ? 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  ? ?Cervical / Trunk Assessment ?Cervical / Trunk Assessment: Other exceptions ?Cervical / Trunk Exceptions: initially very flexed due to first getting up back pain; as moved around able to achieve upright posture  ?Communication  ? Communication: No difficulties  ?Cognition Arousal/Alertness: Awake/alert ?Behavior During Therapy: Agitated, WFL for tasks assessed/performed (trying to unhook IV himself and then pulled IV while trying to calm him; after up walking, calmed down and apologized) ?Overall Cognitive Status: Within Functional Limits for tasks assessed ?  ?  ?  ?  ?  ?  ?  ?  ?  ?  ?  ?  ?  ?  ?  ?  ?  ?  ?  ? ?  ?General Comments   ? ?  ?Exercises    ? ?Assessment/Plan  ?  ?PT Assessment Patient does not need any further PT services  ?PT Problem List   ? ?   ?  ?PT Treatment Interventions     ? ?PT Goals (Current goals can be found in the Care Plan section)  ?Acute Rehab PT Goals ?Patient Stated Goal: return to rehab and find a place to live ?PT Goal Formulation: All assessment and education complete, DC therapy ? ?  ?Frequency   ?  ? ? ?Co-evaluation   ?  ?  ?  ?  ? ? ?  ?AM-PAC PT "6 Clicks" Mobility  ?Outcome Measure Help needed turning from your back to your side while in a flat bed without using bedrails?: None ?Help needed moving from lying on your back to sitting on the side of a flat bed without using bedrails?: None ?Help needed moving to and from a bed to a chair (including a wheelchair)?: None ?Help needed standing up from a chair using your arms (e.g., wheelchair or bedside chair)?: None ?Help needed to walk in hospital room?: None ?Help needed climbing 3-5 steps with a railing? : None ?6 Click Score: 24 ? ?  ?End of Session Equipment Utilized During Treatment:  (pt refused gait  belt) ?Activity Tolerance: Patient limited by pain (but progressed to less pain with incr walking) ?Patient left: Other (comment) (walking in hallway independently; RN aware) ?Nurse Communication: Mobility status;Other (comment) (up independently) ?PT Visit Diagnosis: Difficulty in walking, not elsewhere classified (R26.2) ?  ? ?Time: [id number] ?PT Time Calculation (min) (ACUTE ONLY): 27 min ? ? ?Charges:   PT Evaluation ?$PT Eval Low Complexity: 1 Low ?PT Treatments ?$Gait Training: 8-22 mins ?  ?   ? ? ? ?Arby Barrette, PT ?Acute Rehabilitation Services  ?Pager [phone] ?Office [phone] ? ? ?[name] ?06/25/2021, 8:57 AM ? ?

## 2021-06-25 NOTE — Progress Notes (Signed)
Patient has been out of bed since working with PT; stated his back is chronic pain; walking the halls to relieve stiffness. Mood was calm rising this am and he was cooperative; I ordered his breakfast; EEG has arrived to perform test. ?

## 2021-06-25 NOTE — Progress Notes (Signed)
OT Cancellation Note ? ?Patient Details ?Name: [name] ?MRN: [id number] ?DOB: 22-May-1965 ? ? ?Cancelled Treatment:    Reason Eval/Treat Not Completed: OT screened, no needs identified, will sign off. Pt is able to complete BADL's independently. Per PT no deficits noted. OT will sign off. Thank you for the referral.  ? ?Tacori Kvamme Elane Bing Plume ?06/25/2021, 8:55 AM ?

## 2021-06-26 ENCOUNTER — Emergency Department (HOSPITAL_COMMUNITY): Payer: Medicaid Other

## 2021-06-26 ENCOUNTER — Emergency Department (HOSPITAL_COMMUNITY)
Admission: EM | Admit: 2021-06-26 | Discharge: 2021-06-27 | Disposition: A | Discharge status: Home / Self Care | Payer: Medicaid Other | Attending: Emergency Medicine | Admitting: Emergency Medicine

## 2021-06-26 ENCOUNTER — Encounter (HOSPITAL_COMMUNITY): Payer: Self-pay

## 2021-06-26 ENCOUNTER — Other Ambulatory Visit: Payer: Self-pay

## 2021-06-26 DIAGNOSIS — R2990 Unspecified symptoms and signs involving the nervous system: Secondary | ICD-10-CM | POA: Diagnosis not present

## 2021-06-26 DIAGNOSIS — S80812A Abrasion, left lower leg, initial encounter: Secondary | ICD-10-CM | POA: Diagnosis not present

## 2021-06-26 DIAGNOSIS — X58XXXA Exposure to other specified factors, initial encounter: Secondary | ICD-10-CM | POA: Insufficient documentation

## 2021-06-26 DIAGNOSIS — F1910 Other psychoactive substance abuse, uncomplicated: Secondary | ICD-10-CM | POA: Insufficient documentation

## 2021-06-26 DIAGNOSIS — S80811A Abrasion, right lower leg, initial encounter: Secondary | ICD-10-CM | POA: Insufficient documentation

## 2021-06-26 DIAGNOSIS — J45909 Unspecified asthma, uncomplicated: Secondary | ICD-10-CM | POA: Insufficient documentation

## 2021-06-26 DIAGNOSIS — J449 Chronic obstructive pulmonary disease, unspecified: Secondary | ICD-10-CM | POA: Insufficient documentation

## 2021-06-26 DIAGNOSIS — Z7952 Long term (current) use of systemic steroids: Secondary | ICD-10-CM | POA: Insufficient documentation

## 2021-06-26 DIAGNOSIS — F419 Anxiety disorder, unspecified: Secondary | ICD-10-CM | POA: Insufficient documentation

## 2021-06-26 DIAGNOSIS — R000 Tachycardia, unspecified: Secondary | ICD-10-CM | POA: Insufficient documentation

## 2021-06-26 DIAGNOSIS — G510 Bell's palsy: Secondary | ICD-10-CM | POA: Insufficient documentation

## 2021-06-26 LAB — COMPREHENSIVE METABOLIC PANEL
ALT: 25 U/L (ref 0–44)
ALT: 39 U/L (ref 0–44)
AST: 21 U/L (ref 15–41)
AST: 43 U/L — ABNORMAL HIGH (ref 15–41)
Albumin: 3.2 g/dL — ABNORMAL LOW (ref 3.5–5.0)
Albumin: 3.8 g/dL (ref 3.5–5.0)
Alkaline Phosphatase: 61 U/L (ref 38–126)
Alkaline Phosphatase: 60 U/L (ref 38–126)
Anion gap: 6 (ref 5–15)
Anion gap: 13 (ref 5–15)
BUN: 5 mg/dL — ABNORMAL LOW (ref 6–20)
BUN: 8 mg/dL (ref 6–20)
CO2: 30 mmol/L (ref 22–32)
CO2: 21 mmol/L — ABNORMAL LOW (ref 22–32)
Calcium: 8.7 mg/dL — ABNORMAL LOW (ref 8.9–10.3)
Calcium: 9.3 mg/dL (ref 8.9–10.3)
Chloride: 104 mmol/L (ref 98–111)
Chloride: 106 mmol/L (ref 98–111)
Creatinine, Ser: 0.89 mg/dL (ref 0.61–1.24)
Creatinine, Ser: 0.83 mg/dL (ref 0.61–1.24)
GFR, Estimated: >60 mL/min (ref >60)
GFR, Estimated: >60 mL/min (ref >60)
Glucose, Bld: 98 mg/dL (ref 70–99)
Glucose, Bld: 99 mg/dL (ref 70–99)
Potassium: 3.6 mmol/L (ref 3.5–5.1)
Potassium: 3.4 mmol/L — ABNORMAL LOW (ref 3.5–5.1)
Sodium: 140 mmol/L (ref 135–145)
Sodium: 140 mmol/L (ref 135–145)
Total Bilirubin: 0.4 mg/dL (ref 0.3–1.2)
Total Bilirubin: 0.9 mg/dL (ref 0.3–1.2)
Total Protein: 6.1 g/dL — ABNORMAL LOW (ref 6.5–8.1)
Total Protein: 7.2 g/dL (ref 6.5–8.1)

## 2021-06-26 LAB — DIFFERENTIAL
Abs Immature Granulocytes: 0.02 10*3/uL (ref 0.00–0.07)
Basophils Absolute: 0 10*3/uL (ref 0.0–0.1)
Basophils Relative: 0 %
Eosinophils Absolute: 0 10*3/uL (ref 0.0–0.5)
Eosinophils Relative: 0 %
Immature Granulocytes: 0 %
Lymphocytes Relative: 14 %
Lymphs Abs: 1.1 10*3/uL (ref 0.7–4.0)
Monocytes Absolute: 0.5 10*3/uL (ref 0.1–1.0)
Monocytes Relative: 6 %
Neutro Abs: 6.3 10*3/uL (ref 1.7–7.7)
Neutrophils Relative %: 80 %

## 2021-06-26 LAB — CBC
HCT: 40.2 % (ref 39.0–52.0)
HCT: 43.4 % (ref 39.0–52.0)
Hemoglobin: 14.1 g/dL (ref 13.0–17.0)
Hemoglobin: 15.4 g/dL (ref 13.0–17.0)
MCH: 32.1 pg (ref 26.0–34.0)
MCH: 32.8 pg (ref 26.0–34.0)
MCHC: 35.1 g/dL (ref 30.0–36.0)
MCHC: 35.5 g/dL (ref 30.0–36.0)
MCV: 91.6 fL (ref 80.0–100.0)
MCV: 92.3 fL (ref 80.0–100.0)
Platelets: 188 10*3/uL (ref 150–400)
Platelets: 210 10*3/uL (ref 150–400)
RBC: 4.39 MIL/uL (ref 4.22–5.81)
RBC: 4.7 MIL/uL (ref 4.22–5.81)
RDW: 13.2 % (ref 11.5–15.5)
RDW: 13.2 % (ref 11.5–15.5)
WBC: 5.2 10*3/uL (ref 4.0–10.5)
WBC: 8 10*3/uL (ref 4.0–10.5)
nRBC: 0 % (ref 0.0–0.2)
nRBC: 0 % (ref 0.0–0.2)

## 2021-06-26 LAB — I-STAT CHEM 8, ED
BUN: 8 mg/dL (ref 6–20)
Calcium, Ion: 1.07 mmol/L — ABNORMAL LOW (ref 1.15–1.40)
Chloride: 105 mmol/L (ref 98–111)
Creatinine, Ser: 0.8 mg/dL (ref 0.61–1.24)
Glucose, Bld: 99 mg/dL (ref 70–99)
HCT: 44 % (ref 39.0–52.0)
Hemoglobin: 15 g/dL (ref 13.0–17.0)
Potassium: 3.4 mmol/L — ABNORMAL LOW (ref 3.5–5.1)
Sodium: 140 mmol/L (ref 135–145)
TCO2: 23 mmol/L (ref 22–32)

## 2021-06-26 LAB — ETHANOL: Alcohol, Ethyl (B): <10 mg/dL (ref <10)

## 2021-06-26 LAB — GLUCOSE, CAPILLARY: Glucose-Capillary: 101 mg/dL — ABNORMAL HIGH (ref 70–99)

## 2021-06-26 LAB — MAGNESIUM: Magnesium: 2.2 mg/dL (ref 1.7–2.4)

## 2021-06-26 LAB — APTT: aPTT: 31 seconds (ref 24–36)

## 2021-06-26 LAB — PHOSPHORUS: Phosphorus: 4.1 mg/dL (ref 2.5–4.6)

## 2021-06-26 LAB — CBG MONITORING, ED: Glucose-Capillary: 94 mg/dL (ref 70–99)

## 2021-06-26 LAB — TSH: TSH: 4.081 u[IU]/mL (ref 0.350–4.500)

## 2021-06-26 LAB — PROTIME-INR
INR: 1 (ref 0.8–1.2)
Prothrombin Time: 13.4 seconds (ref 11.4–15.2)

## 2021-06-26 MED ORDER — CLONIDINE HCL 0.1 MG PO TABS
0.1000 mg | ORAL_TABLET | Freq: Three times a day (TID) | ORAL | Status: DC
  Administered 2021-06-26: 0.1 mg via ORAL
  Filled 2021-06-26: qty 1

## 2021-06-26 MED ORDER — THIAMINE HCL 100 MG PO TABS: 100.0000 mg | ORAL_TABLET | Freq: Every day | ORAL | Status: DC

## 2021-06-26 MED ORDER — LORAZEPAM 2 MG/ML IJ SOLN: 0.0000 mg | Freq: Four times a day (QID) | INTRAMUSCULAR | Status: DC

## 2021-06-26 MED ORDER — THIAMINE HCL 100 MG/ML IJ SOLN: 100.0000 mg | Freq: Every day | INTRAMUSCULAR | Status: DC

## 2021-06-26 MED ORDER — CHLORDIAZEPOXIDE HCL 25 MG PO CAPS: 50.0000 mg | ORAL_CAPSULE | Freq: Three times a day (TID) | ORAL | Status: DC

## 2021-06-26 MED ORDER — LORAZEPAM 1 MG PO TABS: 0.0000 mg | ORAL_TABLET | Freq: Two times a day (BID) | ORAL | Status: DC

## 2021-06-26 MED ORDER — LORAZEPAM 1 MG PO TABS: 0.0000 mg | ORAL_TABLET | Freq: Four times a day (QID) | ORAL | Status: DC

## 2021-06-26 MED ORDER — NICOTINE 21 MG/24HR TD PT24
21.0000 mg | MEDICATED_PATCH | Freq: Every day | TRANSDERMAL | Status: DC
  Administered 2021-06-26: 21 mg via TRANSDERMAL
  Filled 2021-06-26: qty 1

## 2021-06-26 MED ORDER — CHLORDIAZEPOXIDE HCL 25 MG PO CAPS
25.0000 mg | ORAL_CAPSULE | Freq: Once | ORAL | Status: AC
  Administered 2021-06-26: 25 mg via ORAL
  Filled 2021-06-26: qty 1

## 2021-06-26 MED ORDER — CHLORDIAZEPOXIDE HCL 25 MG PO CAPS: 50.0000 mg | ORAL_CAPSULE | Freq: Two times a day (BID) | ORAL | Status: DC

## 2021-06-26 MED ORDER — LORAZEPAM 2 MG/ML IJ SOLN: 0.0000 mg | Freq: Two times a day (BID) | INTRAMUSCULAR | Status: DC

## 2021-06-26 NOTE — ED Provider Notes (Signed)
?[facility] EMERGENCY DEPARTMENT ?Provider Note ? ? ?CSN: [id number] ?Arrival date & time: 06/26/21  2243 ? ?  ? ?History ? ?Chief Complaint  ?Patient presents with  ? Code Stroke  ?  Pt was diagnosis with bells palsy yesterday in this ed. Pt said that the symptoms were getting worse. "I was getting weaker"  ? ? ?[name] is a 57 y.o. male. ? ?HPI ? ?  ? ?This is a 56 year old male with a history of COPD, polysubstance abuse including alcohol, TBI, seizure who presents as a code stroke.  Patient presented with right-sided facial droop.  After further examination of chart and patient with neurology, patient was admitted to the hospital and left AGAINST MEDICAL ADVICE earlier today.  At that time he had been evaluated for stroke and had an MRI that was negative.  Thought was that he had a partial Bell's palsy on the right.  On my evaluation, patient states "I did something stupid and left."  He has no physical complaints right now.  He is requesting rehabilitation.  He does not have any SI or HI.  He last had an alcoholic drink 4 to 5 days ago prior to his recent admission.  He states he did not use any drugs or alcohol when he left today.  He denies abdominal pain, chest pain, shortness of breath.  He has chronic neck and back pain.  Patient reports increasing anxiety attacks at home.  Reports the loss of 3 family members this last month. ? ?Home Medications ?Prior to Admission medications   ?Medication Sig Start Date End Date Taking? Authorizing Provider  ?albuterol (PROVENTIL HFA;VENTOLIN HFA) 108 (90 BASE) MCG/ACT inhaler Inhale 2 puffs into the lungs every 6 (six) hours as needed. For shortness of breath   Yes [provider]  ?chlordiazePOXIDE (LIBRIUM) 25 MG capsule 50mg  PO TID x 1D, then 25-50mg  PO BID X 1D, then 25-50mg  PO QD X 1D 06/27/21  Yes Caelyn Route, 06/29/21, MD  ?predniSONE (DELTASONE) 20 MG tablet Take 3 tablets (60 mg total) by mouth daily. 06/27/21  Yes Rafe Mackowski, 06/29/21, MD  ?valACYclovir (VALTREX) 1000 MG tablet Take 1 tablet (1,000 mg total) by mouth 3 (three) times daily for 7 days. 06/27/21 07/04/21 Yes Kenzy Campoverde, 07/06/21, MD  ?amoxicillin-clavulanate (AUGMENTIN) 875-125 MG tablet Take 1 tablet by mouth 2 (two) times daily. ?Patient not taking: Reported on 06/23/2021 06/21/21   08/21/21, MD  ?divalproex (DEPAKOTE ER) 500 MG 24 hr tablet Take 2 tablets (1,000 mg total) by mouth 3 (three) times daily. ?Patient not taking: Reported on 06/27/2021 10/27/18   10/29/18, MD  ?doxycycline (VIBRAMYCIN) 100 MG capsule Take 1 capsule (100 mg total) by mouth 2 (two) times daily. ?Patient not taking: Reported on 06/23/2021 06/21/21   08/21/21, MD  ?   ? ?Allergies    ?Tylenol [acetaminophen]   ? ?Review of Systems   ?Review of Systems  ?Constitutional:  Negative for fever.  ?Respiratory:  Negative for shortness of breath.   ?Cardiovascular:  Negative for chest pain.  ?Gastrointestinal:  Negative for abdominal pain.  ?Musculoskeletal:  Positive for back pain and neck pain.  ?All other systems reviewed and are negative. ? ?Physical Exam ?Updated Vital Signs ?BP (!) 151/94   Pulse 96   Temp 97.8 ?F (36.6 ?C) (Oral)   Resp (!) 21   Ht 1.854 m (6\' 1" )   Wt 115.7 kg   SpO2 96%   BMI 33.65  kg/m?  ?Physical Exam ?Vitals and nursing note reviewed.  ?Constitutional:   ?   Appearance: He is well-developed.  ?   Comments: Chronically ill-appearing but nontoxic, disheveled appearing  ?HENT:  ?   Head: Normocephalic and atraumatic.  ?   Nose: Nose normal.  ?   Mouth/Throat:  ?   Mouth: Mucous membranes are moist.  ?Eyes:  ?   Pupils: Pupils are equal, round, and reactive to light.  ?Cardiovascular:  ?   Rate and Rhythm: Regular rhythm. Tachycardia present.  ?   Heart sounds: Normal heart sounds. No murmur heard. ?Pulmonary:  ?   Effort: Pulmonary effort is normal. No respiratory distress.  ?   Breath sounds: Normal breath sounds. No wheezing.  ?Abdominal:  ?    Palpations: Abdomen is soft.  ?   Tenderness: There is no abdominal tenderness. There is no rebound.  ?Musculoskeletal:  ?   Cervical back: Neck supple.  ?Lymphadenopathy:  ?   Cervical: No cervical adenopathy.  ?Skin: ?   General: Skin is warm and dry.  ?   Comments: Multiple excoriation and abrasions about the legs  ?Neurological:  ?   Mental Status: He is alert and oriented to person, place, and time.  ?   Comments: Slight right-sided facial droop, otherwise 5 out of 5 strength in all 4 extremities, no dysmetria to finger-nose-finger  ?Psychiatric:     ?   Mood and Affect: Mood normal.  ? ? ?ED Results / Procedures / Treatments   ?Labs ?(all labs ordered are listed, but only abnormal results are displayed) ?Labs Reviewed  ?COMPREHENSIVE METABOLIC PANEL - Abnormal; Notable for the following components:  ?    Result Value  ? Potassium 3.4 (*)   ? CO2 21 (*)   ? AST 43 (*)   ? All other components within normal limits  ?I-STAT CHEM 8, ED - Abnormal; Notable for the following components:  ? Potassium 3.4 (*)   ? Calcium, Ion 1.07 (*)   ? All other components within normal limits  ?RESP PANEL BY RT-PCR (FLU A&B, COVID) [id number]  ?ETHANOL  ?PROTIME-INR  ?APTT  ?CBC  ?DIFFERENTIAL  ?RAPID URINE DRUG SCREEN, HOSP PERFORMED  ?URINALYSIS, ROUTINE W REFLEX MICROSCOPIC  ?CBG MONITORING, ED  ? ? ?EKG ?None ? ?Radiology ?CT HEAD CODE STROKE WO CONTRAST ? ?Result Date: 06/26/2021 ?CLINICAL DATA:  Code stroke.  Acute neurologic deficit EXAM: CT HEAD WITHOUT CONTRAST TECHNIQUE: Contiguous axial images were obtained from the base of the skull through the vertex without intravenous contrast. RADIATION DOSE REDUCTION: This exam was performed according to the departmental dose-optimization program which includes automated exposure control, adjustment of the mA and/or kV according to patient size and/or use of iterative reconstruction technique. COMPARISON:  None Available. FINDINGS: Brain: There is no mass, hemorrhage or extra-axial  collection. The size and configuration of the ventricles and extra-axial CSF spaces are normal. The brain parenchyma is normal, without evidence of acute or chronic infarction. Vascular: No abnormal hyperdensity of the major intracranial arteries or dural venous sinuses. No intracranial atherosclerosis. Skull: The visualized skull base, calvarium and extracranial soft tissues are normal. Sinuses/Orbits: No fluid levels or advanced mucosal thickening of the visualized paranasal sinuses. No mastoid or middle ear effusion. The orbits are normal. ASPECTS [facility]([place] Stroke Program Early CT Score) - Ganglionic level infarction (caudate, lentiform nuclei, internal capsule, insula, M1-M3 cortex): 7 - Supraganglionic infarction (M4-M6 cortex): 3 Total score (0-10 with 10 being normal): 10 IMPRESSION: 1. No acute intracranial  abnormality. 2. ASPECTS is 10. These results were called by telephone at the time of interpretation on 06/26/2021 at 10:56 pm to provider Milon Dikes, who verbally acknowledged these results. Electronically Signed   By: Deatra Robinson M.D.   On: 06/26/2021 22:56   ? ?Procedures ?Procedures  ? ? ?Medications Ordered in ED ?Medications  ?LORazepam (ATIVAN) injection 0-4 mg (0 mg Intravenous Not Given 06/27/21 0022)  ?  Or  ?LORazepam (ATIVAN) tablet 0-4 mg ( Oral See Alternative 06/27/21 0022)  ?LORazepam (ATIVAN) injection 0-4 mg (has no administration in time range)  ?  Or  ?LORazepam (ATIVAN) tablet 0-4 mg (has no administration in time range)  ?thiamine tablet 100 mg (has no administration in time range)  ?  Or  ?thiamine (B-1) injection 100 mg (has no administration in time range)  ? ? ?ED Course/ Medical Decision Making/ A&P ?  ?                        ?Medical Decision Making ?Risk ?OTC drugs. ?Prescription drug management. ? ? ?This patient presents to the ED for concern of code stroke, this involves an extensive number of treatment options, and is a complaint that carries with it a high risk of  complications and morbidity.  I considered the following differential and admission for this acute, potentially life threatening condition.  The differential diagnosis includes stroke, seizure, Bell's palsy, anxiety ? ?MDM:

## 2021-06-26 NOTE — Discharge Summary (Addendum)
? ?"Discharge" / LEFT AMA SUMMARY ? ?[name] ?MRN - [id number] ?DOB - 1965/12/06 ? ?Date of Admission - 06/23/2021 ?Date LEFT AMA: 06/26/2021 ? ?Attending Physician:  Jetty Duhamel T ? ?Patient's PCP:  Pcp, No ? ?Disposition: LEFT AMA ? ?Follow-up Appts:  Not able to be arranged or discussed as pt LEFT AMA ? ?Diagnoses at time pt LEFT AMA: ?Suspected right Bell's palsy ?Polysubstance abuse  ?Alcoholism w/ alcohol withdrawal w/o delirium ?Prediabetes ?Dyslipidemia ?Hypokalemia ?Seizure D/O ?COPD  ? ? ?Initial presentation: ?56yo with a history of COPD, polysubstance abuse (EtOH, tobacco, crack), TBI, and seizure disorder who was transported from an alcohol treatment facility to the [facility] ER 5/12 after the acute onset of generalized weakness as well as possible right-sided facial droop, right vision changes, left arm and leg numbness/weakness, and dysarthria.  In the ER a code stroke was called and CT head was unrevealing.  MRI of the brain revealed only a small chronic left opercular infarct but no acute findings.  UDS was positive for marijuana, benzodiazepines, and cocaine. ? ?Hospital Course: ?Listed below are the active problems present, and the status of the care of these problems, at the time the pt decided to LEAVE AMA: ? ?During the patient's stay he proved to be quite impulsive and very difficult to redirect.  He never actually developed significant delirium, and was always alert and oriented on neurologic exam.  He did however have very specific desires and frequently explained that he expected to "be put to sleep until his withdrawal was over."  It was explained to him on multiple occasions that careful titration of benzodiazepines and pain medications would be carried out, but that the goal was not to heavily sedate him but instead to treat him through his withdrawal.  He left the unit 1 time during his hospital stay, but was able to be talked into returning to his room.  On 06/26/2021 however,  he made the decision to leave again and could not be convinced to stay.  He was alert and oriented at the time he left.  It was explained to him that he would not be allowed back onto the unit if he left and that he would be considered AMA and would need to return to the ED should he change his mind and wish to resume treatment. ? ?Suspected right Bell's palsy - CVA ruled out  ?CT head, MRI, MRA w/o acute findings  ?Neuro initially concerned for MRI negative brainstem CVA, but on f/u exam and hx feel this is most c/w a Bell's Palsy - was being tx w/ prednisone and valtrex  ?-TTE notes EF 55-60% with normal LV function and no WMA with grade 1 DD, no significant valve disorders, and no evidence of an atrial level shunt ?-B carotid Dopplers without evidence of significant stenosis and with antegrade vertebral flow bilaterally ?-cont ASA alone but in absence of CVA no indication for Plavix  ?  ?Polysubstance abuse  ?Continue thiamine, folate, multivitamin  ?  ?Alcoholism w/ alcohol withdrawal ?Was severely anxious, agitated, and mildly tremulous frequently - though he displayed mild delirium at times, he never displayed signif enough delirium to impair his independent decision making process - he remained alert and oriented th/o his hospital stay, but displayed a propensity to make poor decisions ?  ?Prediabetes ?A1c 5.8 ?  ?Dyslipidemia ?LDL suboptimally controlled - added atorvastatin 40 mg  ?  ?Hypokalemia ?Likely due to poor nutrition in setting of alcoholism -corrected with supplementation -magnesium normal ?  ?  Seizures ?Stable - continue home Depakote ?  ?COPD  ?Stable at present ? ? ?  ?Medication List  ?  ?Unable to be finalized as pt LEFT AMA ? ?Day of Discharge ?Wt Readings from Last 3 Encounters:  ?06/23/21 115.7 kg  ?06/21/21 115.7 kg  ?10/27/18 105.3 kg  ? ?Temp Readings from Last 3 Encounters:  ?06/26/21 97.8 ?F (36.6 ?C) (Oral)  ?06/21/21 97.7 ?F (36.5 ?C) (Oral)  ?10/27/18 97.6 ?F (36.4 ?C) (Oral)  ? ?BP  Readings from Last 3 Encounters:  ?06/26/21 (!) 144/93  ?06/21/21 (!) 130/103  ?10/27/18 104/65  ? ?Pulse Readings from Last 3 Encounters:  ?06/26/21 76  ?06/21/21 100  ?10/27/18 (!) 58  ? ? ?Physical Exam: ?Exam not able to be completed at time of d/c as pt LEFT AMA -patient was examined earlier in the day and exam was unremarkable and consistent with prior documented exam. ? ?3:15 PM ?06/26/21 ? ?Lonia Blood, MD ?[facility] ?Office  [phone] ? ? ? ? ? ? ?

## 2021-06-26 NOTE — Progress Notes (Signed)
[name] pressed his call light and told the unit secretary that he would like to speak with his nurse. On the way to his room, I received a call from his mother, [name] who alerted me that the patient is planning on leaving and he is really upset.  ?Upon entering the room, [name] was fully clothed, tele box sitting on bed. He did not have his LFA 22g in his arm, per the patient he "done already took care of that". Patient requesting to leave the unit, he is alert,oriented x4 and advised this RN that he needed rehab, not hospitalization, the only way he would stay is if he had a "1:1 psychiatrist and medical induced coma".  ?I spoke at length with the patient regarding his decision to leave including risks of death.Since he removed his IV and refusing another one, I am unable to give IV PRNs and patient refusing PO as well.   ? ?Dr. Sharon Seller notified of patient request to leave. During this morning rounds the MD explained to the patient the various medications we can administer to help him relax and finish hospitalization.  ? ?Patient refused to sign the [facility] paperwork. Two RN witnesses myself Luanna Salk and Melony Overly RN (charge 3W). ?

## 2021-06-26 NOTE — Consult Note (Signed)
Neurology Consultation ? ?Reason for Consult: Code stroke for unresponsiveness, right-sided facial weakness and right-sided facial burning sensation ?Referring Physician: Dr. Thayer Jew ? ?CC: Right-sided facial weakness, unresponsiveness, burning sensation in the right face ? ?History is obtained from: Patient, chart ? ?HPI: [name] is a 56 y.o. male significant past medical history of polysubstance abuse, recent diagnosis of right-sided Bell's palsy, anxiety, panic attacks, seizures in the past-related to TBI as well as stressors per patient, seen recently by stroke neurology 06/25/2021 for strokelike presentation at an outside hospital, left the hospital AMA prior to work-up completion brought back from a [employer] where he was found down and had right lower facial weakness along with complaints of burning sensation in the right face.  He told the EMTs that his right-sided facial weakness is new and started within the last few hours for which a code stroke was activated. ?He reports extreme anxiety and says he needs help because of his anxiety which has been exacerbated by the loss of his father, mother and brother very recently.  He said he was in the hospital and was being evaluated but had panic attacks and had to leave.  During his last admission, his toxicology was positive for cocaine, THC and benzodiazepines.  He says his last alcoholic drink was a few days ago.  He says he does not drink every day but when he drinks he drinks 5-6 cocktails. ?He was evaluated as an acute code stroke-stat CT head imaging negative for acute process. ?Clinical exam was similar to the documented neurological exam from Dr. Erlinda Hong on 06/25/2021 with right-sided facial droop.  He did have a little bit of pressured speech ? ?On review of systems, he reports extreme anxiety and depression.  Reports that he needs help to deal with his anxiety and depression.  I specifically asked him about any suicidal or homicidal ideation  which he denied. ? ?LKW: Few days ago ?tpa given?: no, outside the window, exam not consistent with a stroke ?Premorbid modified Rankin scale (mRS):0 ? ? ?ROS: Full ROS was performed and is negative except as noted in the HPI.  ? ?Past Medical History:  ?Diagnosis Date  ? Arthritis   ? Asthma   ? Chronic neck pain   ? COPD (chronic obstructive pulmonary disease) ([place])   ? Injury of jawline   ? Mental disorder   ? Ruptured disc, cervical   ? Seizures (HCC)   ? Torn rotator cuff   ? ? ? ?  ? ?Family History  ?Problem Relation Age of Onset  ? Heart failure Father   ? Cancer Father   ? Alcoholism Brother   ? ? ? ?Social History:  ? reports that he has been smoking cigarettes and cigars. He has been smoking an average of .5 packs per day. He has never used smokeless tobacco. He reports current alcohol use of about 12.0 standard drinks per week. He reports that he does not currently use drugs after having used the following drugs: Cocaine and Marijuana. ? ?Medications ?No current facility-administered medications for this encounter. ? ?Current Outpatient Medications:  ?  albuterol (PROVENTIL HFA;VENTOLIN HFA) 108 (90 BASE) MCG/ACT inhaler, Inhale 2 puffs into the lungs every 6 (six) hours as needed. For shortness of breath, Disp: , Rfl:  ?  amoxicillin-clavulanate (AUGMENTIN) 875-125 MG tablet, Take 1 tablet by mouth 2 (two) times daily. (Patient not taking: Reported on 06/23/2021), Disp: 20 tablet, Rfl: 0 ?  divalproex (DEPAKOTE ER) 500 MG 24 hr tablet,  Take 2 tablets (1,000 mg total) by mouth 3 (three) times daily., Disp: 90 tablet, Rfl: 0 ?  doxycycline (VIBRAMYCIN) 100 MG capsule, Take 1 capsule (100 mg total) by mouth 2 (two) times daily. (Patient not taking: Reported on 06/23/2021), Disp: 20 capsule, Rfl: 0 ? ? ?Exam: ?Current vital signs: ?There were no vitals taken for this visit. ?Vital signs in last 24 hours: ?Temp:  [97.8 ?F (36.6 ?C)] 97.8 ?F (36.6 ?C) (05/15 1138) ?Pulse Rate:  [67-79] 76 (05/15 1138) ?Resp:   [16-18] 18 (05/15 1138) ?BP: (118-146)/(56-93) 144/93 (05/15 1138) ?SpO2:  [99 %-100 %] 99 % (05/15 1138) ?General: Awake alert in no distress ?HEENT: Cephalic atraumatic ?Lungs: Clear ?Cardiovascular: Regular rhythm ?Abdomen nondistended nontender ?Extremities warm well perfused ?Neurologic exam ?Awake alert oriented x3, speech is mildly dysarthric due to the right facial droop, no evidence of aphasia, cranial nerve examination with equal pupils round react light, extraocular movements are intact, visual fields full, right lower facial droop with not much of upper face involvement, intact auditory acuity, tongue and palate midline, motor examination with no drift in any of the 4 extremities, sensation intact, no gross dysmetria. ?NIH stroke scale 2 -1 for face, 1 for dysarthria ? ?Labs ?I have reviewed labs in epic and the results pertinent to this consultation are: ? ?CBC ?   ?Component Value Date/Time  ? WBC 5.2 06/26/2021 0249  ? RBC 4.39 06/26/2021 0249  ? HGB 14.1 06/26/2021 0249  ? HCT 40.2 06/26/2021 0249  ? PLT 188 06/26/2021 0249  ? MCV 91.6 06/26/2021 0249  ? MCH 32.1 06/26/2021 0249  ? MCHC 35.1 06/26/2021 0249  ? RDW 13.2 06/26/2021 0249  ? LYMPHSABS 2.4 06/23/2021 1726  ? MONOABS 0.6 06/23/2021 1726  ? EOSABS 0.1 06/23/2021 1726  ? BASOSABS 0.0 06/23/2021 1726  ? ? ?CMP  ?   ?Component Value Date/Time  ? NA 140 06/26/2021 0249  ? K 3.6 06/26/2021 0249  ? CL 104 06/26/2021 0249  ? CO2 30 06/26/2021 0249  ? GLUCOSE 98 06/26/2021 0249  ? BUN 5 (L) 06/26/2021 0249  ? CREATININE 0.89 06/26/2021 0249  ? CALCIUM 8.7 (L) 06/26/2021 0249  ? PROT 6.1 (L) 06/26/2021 0249  ? ALBUMIN 3.2 (L) 06/26/2021 0249  ? AST 21 06/26/2021 0249  ? ALT 25 06/26/2021 0249  ? ALKPHOS 61 06/26/2021 0249  ? BILITOT 0.4 06/26/2021 0249  ? GFRNONAA >60 06/26/2021 0249  ? GFRAA >60 10/25/2018 2314  ? ? ?Lipid Panel  ?   ?Component Value Date/Time  ? CHOL 193 06/24/2021 0748  ? TRIG 144 06/24/2021 0748  ? HDL 43 06/24/2021 0748  ?  CHOLHDL 4.5 06/24/2021 0748  ? VLDL 29 06/24/2021 0748  ? [place] 121 (H) 06/24/2021 0748  ? ? ? ?Imaging ?I have reviewed the images obtained ? ?CT-head-no acute changes.  Aspects 10. ?MRI from 06/23/2021 with no acute stroke. ? ?Assessment and recommendations: ?56 year old with above past medical history presenting for evaluation of right-sided facial burning sensation and right-sided facial droop.  His right-sided facial droop has now been ongoing for many days if not weeks.  Dr. Erlinda Hong from stroke neurology had seen him yesterday and recommended treatment for presumed Bell's palsy.  He left AMA during his admission 2 days ago. ?I do not suspect a stroke based on my examination.  Stat CT head negative for acute process. ?He reports extreme stressors and anxiety and struggling with polysubstance abuse and wants help. ?At this point, I do not have any  further inpatient neurological recommendations for him as his exam is more consistent with a chronic Bell's palsy as well as substance related mood issues, anxiety and panic attacks. ?Continue his home antiepileptics. ?Treatment of Bell's palsy-valacyclovir and steroids ?He would benefit from a psychiatric evaluation. ?Discussed my plan in detail with Dr. Dina Rich in the emergency room. ?Please call inpatient neurology if we can be of any further assistance. ? ? ?-- ?Amie Portland, MD ?Neurologist ?[facility] ?Pager: [phone] ? ?

## 2021-06-26 NOTE — Progress Notes (Addendum)
Patient called front desk in an anxious/panicked state asking for anxiety medication and pain medication.  ? ?Patient walking around room from bathroom, restless. Patient had CIWA score of 12, given 2 mg of Ativan PO. Patient was c/o severe pain in back. Given Morphine IV Push.  ? ?Patient explained aggressively that he was promised to have a morphine drip per an MD he stated he could not recall. I explained that a drip and sedation-like measures were not in his orders, but the IV push was. Patient was understanding initially but then became aggressive stating that is what he wanted and desired to be sedated. ? ?Based on nursing assessment, patient seems appropriate for IV push. After receiving said medication, patient calmed down.  ?

## 2021-06-27 MED ORDER — CHLORDIAZEPOXIDE HCL 25 MG PO CAPS: ORAL_CAPSULE | ORAL | 0 refills | Status: DC

## 2021-06-27 MED ORDER — VALACYCLOVIR HCL 1 G PO TABS: 1000.0000 mg | ORAL_TABLET | Freq: Three times a day (TID) | ORAL | 0 refills | Status: AC

## 2021-06-27 MED ORDER — PREDNISONE 20 MG PO TABS: 60.0000 mg | ORAL_TABLET | Freq: Every day | ORAL | 0 refills | Status: DC

## 2021-06-27 NOTE — Discharge Instructions (Signed)
You were seen today with ongoing concerns for anxiety, polysubstance abuse, right-sided facial droop.  You likely have a Bell's palsy.  Take medications as prescribed.  Regarding her polysubstance abuse, you will be given resources for outpatient detox.  You will be given a short course of Librium for any ongoing alcohol withdrawal symptoms although you are approaching the safe window. ?

## 2021-06-29 ENCOUNTER — Ambulatory Visit: Payer: Self-pay | Admitting: Psychiatry

## 2021-06-29 ENCOUNTER — Encounter: Payer: Self-pay | Admitting: Psychiatry

## 2021-06-29 NOTE — Progress Notes (Deleted)
[facility]  PATIENT: [name] DOB: 12/15/1965  REFERRING CLINICIAN: Rosalin Hawking, MD HISTORY FROM: *** REASON FOR VISIT: Facial weakness   HISTORICAL  CHIEF COMPLAINT:  No chief complaint on file.   HISTORY OF PRESENT ILLNESS:  The patient presents for evaluation of right facial weakness which began 06/23/21. He presented to the ED where MRI brain showed a chronic left opercular infarct and no acute findings. Urine drug screen was positive for marijuana, benzodiazepines, and cocaine. TTE was unremarkable. MRA head and carotid dopplers did not show evidence of significant stenosis. Lipid panel showed an LDL of 121 and A1c was 5.8. Per documentation he appeared to be in alcohol withdrawal and was agitated and tremulous. He left AMA, then returned to the ED for the same symptoms on 06/26/21. In the ED he was given prednisone and valcyclovir. Lipitor 40 mg was added for HLD.  OTHER MEDICAL CONDITIONS: COPD, polysubstance and alcohol abuse, TBI, seizure, CVA   REVIEW OF SYSTEMS: Full 14 system review of systems performed and negative with exception of: ***  ALLERGIES: Allergies  Allergen Reactions   Tylenol [Acetaminophen] Other (See Comments)    Upset stomach    HOME MEDICATIONS: Outpatient Medications Prior to Visit  Medication Sig Dispense Refill   albuterol (PROVENTIL HFA;VENTOLIN HFA) 108 (90 BASE) MCG/ACT inhaler Inhale 2 puffs into the lungs every 6 (six) hours as needed. For shortness of breath     amoxicillin-clavulanate (AUGMENTIN) 875-125 MG tablet Take 1 tablet by mouth 2 (two) times daily. (Patient not taking: Reported on 06/23/2021) 20 tablet 0   chlordiazePOXIDE (LIBRIUM) 25 MG capsule 50mg  PO TID x 1D, then 25-50mg  PO BID X 1D, then 25-50mg  PO QD X 1D 10 capsule 0   divalproex (DEPAKOTE ER) 500 MG 24 hr tablet Take 2 tablets (1,000 mg total) by mouth 3 (three) times daily. (Patient not taking: Reported on 06/27/2021) 90 tablet 0   doxycycline  (VIBRAMYCIN) 100 MG capsule Take 1 capsule (100 mg total) by mouth 2 (two) times daily. (Patient not taking: Reported on 06/23/2021) 20 capsule 0   predniSONE (DELTASONE) 20 MG tablet Take 3 tablets (60 mg total) by mouth daily. 15 tablet 0   valACYclovir (VALTREX) 1000 MG tablet Take 1 tablet (1,000 mg total) by mouth 3 (three) times daily for 7 days. 21 tablet 0   No facility-administered medications prior to visit.    PAST MEDICAL HISTORY: Past Medical History:  Diagnosis Date   Arthritis    Asthma    Chronic neck pain    COPD (chronic obstructive pulmonary disease) ([place])    Injury of jawline    Mental disorder    Ruptured disc, cervical    Seizures (HCC)    Torn rotator cuff     PAST SURGICAL HISTORY: No past surgical history on file.  FAMILY HISTORY: Family History  Problem Relation Age of Onset   Heart failure Father    Cancer Father    Alcoholism Brother     SOCIAL HISTORY: Social History   Socioeconomic History   Marital status: Single    Spouse name: Not on file   Number of children: Not on file   Years of education: Not on file   Highest education level: Not on file  Occupational History   Not on file  Tobacco Use   Smoking status: Some Days    Packs/day: 0.50    Types: Cigarettes, Cigars   Smokeless tobacco: Never  Vaping Use   Vaping Use:  Never used  Substance and Sexual Activity   Alcohol use: Yes    Alcohol/week: 12.0 standard drinks    Types: 12 Cans of beer per week    Comment: daily   Drug use: Not Currently    Types: Cocaine, Marijuana    Comment: hx of THC and crack cocaine abuse   Sexual activity: Not on file  Other Topics Concern   Not on file  Social History Narrative   Not on file   Social Determinants of Health   Financial Resource Strain: Not on file  Food Insecurity: Not on file  Transportation Needs: Not on file  Physical Activity: Not on file  Stress: Not on file  Social Connections: Not on file  Intimate Partner  Violence: Not on file     PHYSICAL EXAM ***  GENERAL EXAM/CONSTITUTIONAL: Vitals: There were no vitals filed for this visit. There is no height or weight on file to calculate BMI. Wt Readings from Last 3 Encounters:  06/26/21 255 lb 1.2 oz (115.7 kg)  06/23/21 255 lb 1.2 oz (115.7 kg)  06/21/21 255 lb (115.7 kg)   Patient is in no distress; well developed, nourished and groomed; neck is supple  CARDIOVASCULAR: Examination of carotid arteries is normal; no carotid bruits Regular rate and rhythm, no murmurs Examination of peripheral vascular system by observation and palpation is normal  EYES: Pupils round and reactive to light, Visual fields full to confrontation, Extraocular movements intacts,   MUSCULOSKELETAL: Gait, strength, tone, movements noted in Neurologic exam below  NEUROLOGIC: MENTAL STATUS:      View : No data to display.         awake, alert, oriented to person, place and time recent and remote memory intact normal attention and concentration language fluent, comprehension intact, naming intact fund of knowledge appropriate  CRANIAL NERVE:  2nd - no papilledema or hemorrhages on fundoscopic exam 2nd, 3rd, 4th, 6th - pupils equal and reactive to light, visual fields full to confrontation, extraocular muscles intact, no nystagmus 5th - facial sensation symmetric 7th - facial strength symmetric 8th - hearing intact 9th - palate elevates symmetrically, uvula midline 11th - shoulder shrug symmetric 12th - tongue protrusion midline  MOTOR:  normal bulk and tone, full strength in the BUE, BLE  SENSORY:  normal and symmetric to light touch, pinprick, temperature, vibration  COORDINATION:  finger-nose-finger, fine finger movements normal  REFLEXES:  deep tendon reflexes present and symmetric  GAIT/STATION:  normal     DIAGNOSTIC DATA (LABS, IMAGING, TESTING) - I reviewed patient records, labs, notes, testing and imaging myself where  available.  Lab Results  Component Value Date   WBC 8.0 06/26/2021   HGB 15.0 06/26/2021   HCT 44.0 06/26/2021   MCV 92.3 06/26/2021   PLT 210 06/26/2021      Component Value Date/Time   NA 140 06/26/2021 2253   K 3.4 (L) 06/26/2021 2253   CL 105 06/26/2021 2253   CO2 21 (L) 06/26/2021 2248   GLUCOSE 99 06/26/2021 2253   BUN 8 06/26/2021 2253   CREATININE 0.80 06/26/2021 2253   CALCIUM 9.3 06/26/2021 2248   PROT 7.2 06/26/2021 2248   ALBUMIN 3.8 06/26/2021 2248   AST 43 (H) 06/26/2021 2248   ALT 39 06/26/2021 2248   ALKPHOS 60 06/26/2021 2248   BILITOT 0.9 06/26/2021 2248   GFRNONAA >60 06/26/2021 2248   GFRAA >60 10/25/2018 2314   Lab Results  Component Value Date   CHOL 193 06/24/2021  HDL 43 06/24/2021   LDLCALC 121 (H) 06/24/2021   TRIG 144 06/24/2021   CHOLHDL 4.5 06/24/2021   Lab Results  Component Value Date   HGBA1C 5.8 (H) 06/23/2021   No results found for: [id number] Lab Results  Component Value Date   TSH 4.081 06/26/2021    ***    ASSESSMENT AND PLAN  56 y.o. year old male with ***   No diagnosis found.    PLAN:   No orders of the defined types were placed in this encounter.   No orders of the defined types were placed in this encounter.   No follow-ups on file.    Genia Harold, MD  I spent an average of *** chart reviewing and counseling the patient, with at least 50% of the time face to face with the patient.   [facility] [address] [place], [place] [address] [phone]

## 2021-06-29 NOTE — Progress Notes (Signed)
Code stroke ct time 1722 call 1723 beeper 1729 exam started 1729 exam finished 1729 image sent to [place] exam completed [facility] called

## 2022-02-17 ENCOUNTER — Emergency Department (HOSPITAL_COMMUNITY)
Admission: EM | Admit: 2022-02-17 | Discharge: 2022-02-19 | Disposition: A | Discharge status: Home / Self Care | Payer: Medicaid Other | Attending: Emergency Medicine | Admitting: Emergency Medicine

## 2022-02-17 ENCOUNTER — Emergency Department (HOSPITAL_COMMUNITY): Payer: Medicaid Other

## 2022-02-17 DIAGNOSIS — F17210 Nicotine dependence, cigarettes, uncomplicated: Secondary | ICD-10-CM | POA: Diagnosis not present

## 2022-02-17 DIAGNOSIS — F19959 Other psychoactive substance use, unspecified with psychoactive substance-induced psychotic disorder, unspecified: Secondary | ICD-10-CM | POA: Insufficient documentation

## 2022-02-17 DIAGNOSIS — S0990XA Unspecified injury of head, initial encounter: Secondary | ICD-10-CM | POA: Diagnosis present

## 2022-02-17 DIAGNOSIS — Z1152 Encounter for screening for COVID-19: Secondary | ICD-10-CM | POA: Insufficient documentation

## 2022-02-17 DIAGNOSIS — R4689 Other symptoms and signs involving appearance and behavior: Secondary | ICD-10-CM

## 2022-02-17 DIAGNOSIS — F19159 Other psychoactive substance abuse with psychoactive substance-induced psychotic disorder, unspecified: Secondary | ICD-10-CM | POA: Insufficient documentation

## 2022-02-17 DIAGNOSIS — R569 Unspecified convulsions: Secondary | ICD-10-CM | POA: Diagnosis not present

## 2022-02-17 DIAGNOSIS — X58XXXA Exposure to other specified factors, initial encounter: Secondary | ICD-10-CM | POA: Diagnosis not present

## 2022-02-17 DIAGNOSIS — F32A Depression, unspecified: Secondary | ICD-10-CM | POA: Insufficient documentation

## 2022-02-17 DIAGNOSIS — S0081XA Abrasion of other part of head, initial encounter: Secondary | ICD-10-CM | POA: Diagnosis not present

## 2022-02-17 DIAGNOSIS — J449 Chronic obstructive pulmonary disease, unspecified: Secondary | ICD-10-CM | POA: Diagnosis not present

## 2022-02-17 LAB — CBC WITH DIFFERENTIAL/PLATELET
Abs Immature Granulocytes: 0.01 10*3/uL (ref 0.00–0.07)
Basophils Absolute: 0 10*3/uL (ref 0.0–0.1)
Basophils Relative: 0 %
Eosinophils Absolute: 0.2 10*3/uL (ref 0.0–0.5)
Eosinophils Relative: 2 %
HCT: 46 % (ref 39.0–52.0)
Hemoglobin: 15.8 g/dL (ref 13.0–17.0)
Immature Granulocytes: 0 %
Lymphocytes Relative: 46 %
Lymphs Abs: 3.2 10*3/uL (ref 0.7–4.0)
MCH: 31.7 pg (ref 26.0–34.0)
MCHC: 34.3 g/dL (ref 30.0–36.0)
MCV: 92.4 fL (ref 80.0–100.0)
Monocytes Absolute: 0.5 10*3/uL (ref 0.1–1.0)
Monocytes Relative: 7 %
Neutro Abs: 3.1 10*3/uL (ref 1.7–7.7)
Neutrophils Relative %: 45 %
Platelets: 215 10*3/uL (ref 150–400)
RBC: 4.98 MIL/uL (ref 4.22–5.81)
RDW: 13.4 % (ref 11.5–15.5)
WBC: 6.9 10*3/uL (ref 4.0–10.5)
nRBC: 0 % (ref 0.0–0.2)

## 2022-02-17 LAB — COMPREHENSIVE METABOLIC PANEL
ALT: 23 U/L (ref 0–44)
AST: 32 U/L (ref 15–41)
Albumin: 3.6 g/dL (ref 3.5–5.0)
Alkaline Phosphatase: 49 U/L (ref 38–126)
Anion gap: 11 (ref 5–15)
BUN: 7 mg/dL (ref 6–20)
CO2: 22 mmol/L (ref 22–32)
Calcium: 8.5 mg/dL — ABNORMAL LOW (ref 8.9–10.3)
Chloride: 106 mmol/L (ref 98–111)
Creatinine, Ser: 0.86 mg/dL (ref 0.61–1.24)
GFR, Estimated: >60 mL/min (ref >60)
Glucose, Bld: 104 mg/dL — ABNORMAL HIGH (ref 70–99)
Potassium: 3.8 mmol/L (ref 3.5–5.1)
Sodium: 139 mmol/L (ref 135–145)
Total Bilirubin: 0.7 mg/dL (ref 0.3–1.2)
Total Protein: 6.3 g/dL — ABNORMAL LOW (ref 6.5–8.1)

## 2022-02-17 LAB — ETHANOL: Alcohol, Ethyl (B): 149 mg/dL — ABNORMAL HIGH (ref <10)

## 2022-02-17 LAB — SALICYLATE LEVEL: Salicylate Lvl: <7 mg/dL — ABNORMAL LOW (ref 7.0–30.0)

## 2022-02-17 LAB — ACETAMINOPHEN LEVEL: Acetaminophen (Tylenol), Serum: <10 ug/mL — ABNORMAL LOW (ref 10–30)

## 2022-02-17 MED ORDER — LACTATED RINGERS IV BOLUS
1000.0000 mL | Freq: Once | INTRAVENOUS | Status: AC
  Administered 2022-02-17: 1000 mL via INTRAVENOUS

## 2022-02-17 MED ORDER — HALOPERIDOL LACTATE 5 MG/ML IJ SOLN
INTRAMUSCULAR | Status: AC
  Administered 2022-02-17: 10 mg via INTRAMUSCULAR
  Filled 2022-02-17: qty 1

## 2022-02-17 MED ORDER — KETAMINE HCL 50 MG/ML IJ SOLN: 4.0000 mg/kg | Freq: Once | INTRAMUSCULAR | Status: DC

## 2022-02-17 MED ORDER — ZIPRASIDONE MESYLATE 20 MG IM SOLR
20.0000 mg | Freq: Once | INTRAMUSCULAR | Status: AC
  Administered 2022-02-17: 20 mg via INTRAMUSCULAR
  Filled 2022-02-17: qty 20

## 2022-02-17 MED ORDER — STERILE WATER FOR INJECTION IJ SOLN
INTRAMUSCULAR | Status: AC
  Administered 2022-02-17: 10 mL
  Filled 2022-02-17: qty 10

## 2022-02-17 MED ORDER — LACTATED RINGERS IV BOLUS
1000.0000 mL | Freq: Once | INTRAVENOUS | Status: AC
  Administered 2022-02-18: 1000 mL via INTRAVENOUS

## 2022-02-17 MED ORDER — LEVETIRACETAM IN NACL 1000 MG/100ML IV SOLN
1000.0000 mg | Freq: Once | INTRAVENOUS | Status: DC
  Filled 2022-02-17: qty 100

## 2022-02-17 MED ORDER — HALOPERIDOL LACTATE 5 MG/ML IJ SOLN
INTRAMUSCULAR | Status: AC
  Administered 2022-02-18: 10 mg via INTRAMUSCULAR
  Filled 2022-02-17: qty 1

## 2022-02-17 MED ORDER — HALOPERIDOL LACTATE 5 MG/ML IJ SOLN: 10.0000 mg | Freq: Once | INTRAMUSCULAR | Status: AC

## 2022-02-17 MED ORDER — KETAMINE HCL 50 MG/ML IJ SOLN
4.0000 mg/kg | Freq: Once | INTRAMUSCULAR | Status: AC
  Administered 2022-02-17: 455 mg via INTRAMUSCULAR
  Filled 2022-02-17: qty 10

## 2022-02-17 MED ORDER — HALOPERIDOL LACTATE 5 MG/ML IJ SOLN
INTRAMUSCULAR | Status: AC
  Filled 2022-02-17: qty 1

## 2022-02-17 NOTE — ED Notes (Signed)
Patient was communicating calmly with RN and became frustrated with monitoring devices and his glasses. Patient threw his glasses across the room and ripped off O2 sensor, turned and lept out of the bed. Patient yelled that he was leaving and wanted get out of here. RN left the room and patient slammed the door and continued yelling. Security paged and provider aware.

## 2022-02-17 NOTE — ED Notes (Signed)
IVC paperwork complete 
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
## 2022-02-17 NOTE — ED Provider Notes (Signed)
Care transferred to me.  Patient is awake and alert but still intermittently agitated.  He was given Geodon prior to going to CT.  His CT images have been viewed/interpreted by myself and there is no obvious head bleed.  X-ray also viewed/interpreted by myself and no pneumonia.  He does have a cough and he tells me he had a cough for couple days but then later he asked for his IV to be taken out and becomes agitated again.  Unfortunately this is despite Haldol and Geodon and so we will give him IM ketamine.  He will need to be on the cardiac monitor with capnography.  He will be given IV fluids.  He had some soft blood pressures though otherwise his workup has been unremarkable including normal WBC, renal function, electrolytes, etc. Care transferred to Dr. Roxanne Mins.   Sherwood Gambler, MD 02/17/22 [phone]

## 2022-02-17 NOTE — ED Notes (Signed)
Patient placed on ETCO2 monitor and pulse ox in new room. Patient remains awake but not oriented at this time. Hand off given to new nurse, Albina Billet.
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
## 2022-02-17 NOTE — ED Provider Notes (Signed)
Care assumed from Dr. Regenia Skeeter, patient with agitation, following fall, borderline hypotension. He is under IVC. He is pending TTS consultation once blood pressure stabilizes..  3:19 AM Blood pressure has been consistently over 638 systolic for the last 2 hours, patient has been resting comfortably.  I have ordered TTS consultation.   Delora Fuel, MD 45/36/46 [phone]

## 2022-02-18 DIAGNOSIS — F19959 Other psychoactive substance use, unspecified with psychoactive substance-induced psychotic disorder, unspecified: Secondary | ICD-10-CM | POA: Insufficient documentation

## 2022-02-18 LAB — RAPID URINE DRUG SCREEN, HOSP PERFORMED
Amphetamines: NOT DETECTED
Barbiturates: NOT DETECTED
Benzodiazepines: POSITIVE — AB
Cocaine: POSITIVE — AB
Opiates: NOT DETECTED
Tetrahydrocannabinol: POSITIVE — AB

## 2022-02-18 LAB — RESP PANEL BY RT-PCR (RSV, FLU A&B, COVID)  RVPGX2
Influenza A by PCR: NEGATIVE
Influenza B by PCR: NEGATIVE
Resp Syncytial Virus by PCR: NEGATIVE
SARS Coronavirus 2 by RT PCR: NEGATIVE

## 2022-02-18 MED ORDER — QUETIAPINE FUMARATE ER 50 MG PO TB24
100.0000 mg | ORAL_TABLET | Freq: Every day | ORAL | Status: DC
  Administered 2022-02-18: 100 mg via ORAL
  Filled 2022-02-18: qty 2

## 2022-02-18 MED ORDER — IBUPROFEN 400 MG PO TABS
600.0000 mg | ORAL_TABLET | Freq: Three times a day (TID) | ORAL | Status: DC | PRN
  Administered 2022-02-18: 600 mg via ORAL
  Filled 2022-02-18: qty 1

## 2022-02-18 MED ORDER — LEVETIRACETAM IN NACL 1000 MG/100ML IV SOLN
1000.0000 mg | Freq: Once | INTRAVENOUS | Status: AC
  Administered 2022-02-18: 1000 mg via INTRAVENOUS
  Filled 2022-02-18: qty 100

## 2022-02-18 MED ORDER — LEVETIRACETAM 500 MG PO TABS
750.0000 mg | ORAL_TABLET | Freq: Two times a day (BID) | ORAL | Status: DC
  Administered 2022-02-18 (×2): 750 mg via ORAL
  Filled 2022-02-18 (×2): qty 1

## 2022-02-18 MED ORDER — DIVALPROEX SODIUM ER 500 MG PO TB24
1000.0000 mg | ORAL_TABLET | Freq: Three times a day (TID) | ORAL | Status: DC
  Filled 2022-02-18: qty 2

## 2022-02-18 MED ORDER — ALUM & MAG HYDROXIDE-SIMETH 200-200-20 MG/5ML PO SUSP: 30.0000 mL | Freq: Four times a day (QID) | ORAL | Status: DC | PRN

## 2022-02-18 MED ORDER — QUETIAPINE FUMARATE ER 50 MG PO TB24
100.0000 mg | ORAL_TABLET | Freq: Every day | ORAL | Status: DC
  Filled 2022-02-18: qty 2

## 2022-02-18 MED ORDER — NICOTINE 14 MG/24HR TD PT24: 14.0000 mg | MEDICATED_PATCH | Freq: Every day | TRANSDERMAL | Status: DC

## 2022-02-18 MED ORDER — ONDANSETRON HCL 4 MG PO TABS: 4.0000 mg | ORAL_TABLET | Freq: Three times a day (TID) | ORAL | Status: DC | PRN

## 2022-02-18 MED ORDER — LORAZEPAM 1 MG PO TABS
2.0000 mg | ORAL_TABLET | Freq: Once | ORAL | Status: AC
  Administered 2022-02-18: 2 mg via ORAL
  Filled 2022-02-18: qty 2

## 2022-02-18 NOTE — ED Notes (Signed)
IVC paperwork is in orange zone on purple clipboard
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
## 2022-02-18 NOTE — ED Provider Notes (Signed)
Emergency Medicine Observation Re-evaluation Note  [name] is a 57 y.o. male, seen on rounds today.  Pt initially presented to the ED for complaints of Alcohol Intoxication Currently, the patient is laying on the stretcher, much calmer than yesterday.  Physical Exam  BP 131/66 (BP Location: Right Arm)   Pulse 89   Temp 98 F (36.7 C) (Oral)   Resp 16   Ht 6\' 1"  (1.854 m)   Wt 113.4 kg Comment: estimate  SpO2 98%   BMI 32.98 kg/m  Physical Exam General: No acute distress Cardiac: Normal rate Lungs: No increased work of breathing Psych: Calm and cooperative  ED Course / MDM  EKG:EKG Interpretation  Date/Time:  Saturday February 17 2022 18:30:25 EST Ventricular Rate:  88 PR Interval:  153 QRS Duration: 102 QT Interval:  368 QTC Calculation: 446 R Axis:   84 Text Interpretation: Sinus rhythm since last tracing no significant change Confirmed by Malvin Johns [phone]) on 02/17/2022 6:38:49 PM  I have reviewed the labs performed to date as well as medications administered while in observation.  Recent changes in the last 24 hours include required sedation last night, awaiting psychiatric assessment.  Plan  Current plan is for psychiatric assessment.  Will also have the pharmacy tech to see if they can determine what medications the patient is supposed to be on.Malvin Johns, MD 02/18/22 1124

## 2022-02-18 NOTE — ED Notes (Addendum)
Pt was brought up to speed on plan of care and reminded about what happened to bring him here.  It was explained that he is IVC'd and his belongings are locked up at this time.  He did not appear upset by this.  He is A&O at this time.   Pt has chronic pain for which he states that he take 10mg  percocet.  He only has ibuprofen at this time. He has been somewhat restless in bed he states d/t pain.  He politely declined the ibuprofen at this time.  PT provided a [place] sandwhich and soda
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
## 2022-02-18 NOTE — BH Assessment (Signed)
Clinician messaged Mahlik A. Dub Mikes, RN: "Hey. It's Trey with TTS. Is the pt able to engage in the assessment, if so the pt will need to be placed in a private room. Is the pt medically cleared? Can you fax the IVC paperwork to [phone]."   Vertell Novak, MS, [facility], [facility] Triage Specialist [phone]

## 2022-02-18 NOTE — ED Notes (Signed)
Patient provided toiletries to shower. Patient belongings inventoried and placed in locker 1. Patient valuables with security. Patient aware of this and agreeable.

## 2022-02-18 NOTE — ED Notes (Signed)
Nurse practitioner is at bedside at this time.

## 2022-02-18 NOTE — ED Notes (Signed)
Patient reports feeling agitated because he's not in his own clothes and his back hurts. Provider made aware and medication requested.

## 2022-02-18 NOTE — BH Assessment (Signed)
Per Mahlik A. Dub Mikes, RN: "Hey, pt is not able to engage in assessment at this time, pt it still sleeping from medications administered earlier. I do not believe he is medically cleared as of yet, we can fax over his paperwork."   Clinician the RN to let her know when the pt is awake, medically cleared and able to engage in the assessment.    Vertell Novak, [place], [facility], [facility] Triage Specialist [phone]

## 2022-02-18 NOTE — ED Notes (Signed)
IVC paperwork is in green zone on purple clipboard

## 2022-02-18 NOTE — ED Notes (Signed)
TTS at bedside in person.

## 2022-02-19 NOTE — ED Provider Notes (Addendum)
Emergency Medicine Observation Re-evaluation Note  [name] is a 57 y.o. male, seen on rounds today.  Pt initially presented to the ED for complaints of Alcohol Intoxication Currently, the patient is resting comfortably and just received his breakfast.  Physical Exam  BP (!) 108/90 (BP Location: Right Arm)   Pulse 71   Temp 97.7 F (36.5 C) (Oral)   Resp 15   Ht 6\' 1"  (1.854 m)   Wt 113.4 kg Comment: estimate  SpO2 99%   BMI 32.98 kg/m  Physical Exam General: Resting with no agitation Cardiac: No murmur on my initial exam Lungs: Clear bilaterally Psych: No agitation  ED Course / MDM  EKG:EKG Interpretation  Date/Time:  Saturday February 17 2022 18:30:25 EST Ventricular Rate:  88 PR Interval:  153 QRS Duration: 102 QT Interval:  368 QTC Calculation: 446 R Axis:   84 Text Interpretation: Sinus rhythm since last tracing no significant change Confirmed by Malvin Johns [phone]) on 02/17/2022 6:38:49 PM  I have reviewed the labs performed to date as well as medications administered while in observation.  Recent changes in the last 24 hours include none reported by nursing.  Plan  Current plan is for awaiting psychiatric recommendations.    Nickolaus Bordelon, Gwenyth Allegra, MD 02/19/22 0820   9:25 AM While awaiting reassessment this morning by psychiatry, patient now says he would like to leave.  He found paperwork in his pocket that outlines he has an appointment in about 30 minutes with [employer] to get more resources and housing.  He does not want to miss this.  He is denying SI or HI and is not agitated.  He was clearly explained he would like to go home.  Chart review shows that psychiatry felt he was stable for discharge if he decided to leave and we could undo the IVC.  IVC paperwork was filled out to resend and patient will be discharged per psychiatry plan.  He understands return precautions and follow-up instructions and was discharged in good condition.    Vasco Chong, Gwenyth Allegra, MD 02/19/22 [phone]

## 2022-02-19 NOTE — ED Notes (Signed)
Pt refused DC vitals 

## 2022-03-12 ENCOUNTER — Other Ambulatory Visit: Payer: Self-pay

## 2022-03-12 ENCOUNTER — Encounter (HOSPITAL_COMMUNITY): Payer: Self-pay | Admitting: Emergency Medicine

## 2022-03-12 ENCOUNTER — Emergency Department (HOSPITAL_COMMUNITY)
Admission: EM | Admit: 2022-03-12 | Discharge: 2022-03-12 | Disposition: A | Discharge status: Home / Self Care | Payer: Medicaid Other | Attending: Emergency Medicine | Admitting: Emergency Medicine

## 2022-03-12 DIAGNOSIS — R55 Syncope and collapse: Secondary | ICD-10-CM | POA: Insufficient documentation

## 2022-03-12 DIAGNOSIS — I2510 Atherosclerotic heart disease of native coronary artery without angina pectoris: Secondary | ICD-10-CM | POA: Insufficient documentation

## 2022-03-12 DIAGNOSIS — R109 Unspecified abdominal pain: Secondary | ICD-10-CM | POA: Insufficient documentation

## 2022-03-12 DIAGNOSIS — R110 Nausea: Secondary | ICD-10-CM | POA: Diagnosis not present

## 2022-03-12 LAB — BASIC METABOLIC PANEL
Anion gap: 11 (ref 5–15)
BUN: 10 mg/dL (ref 6–20)
CO2: 21 mmol/L — ABNORMAL LOW (ref 22–32)
Calcium: 7.9 mg/dL — ABNORMAL LOW (ref 8.9–10.3)
Chloride: 104 mmol/L (ref 98–111)
Creatinine, Ser: 0.87 mg/dL (ref 0.61–1.24)
GFR, Estimated: >60 mL/min (ref >60)
Glucose, Bld: 106 mg/dL — ABNORMAL HIGH (ref 70–99)
Potassium: 3.5 mmol/L (ref 3.5–5.1)
Sodium: 136 mmol/L (ref 135–145)

## 2022-03-12 LAB — CBC
HCT: 42.2 % (ref 39.0–52.0)
Hemoglobin: 14.8 g/dL (ref 13.0–17.0)
MCH: 32.5 pg (ref 26.0–34.0)
MCHC: 35.1 g/dL (ref 30.0–36.0)
MCV: 92.7 fL (ref 80.0–100.0)
Platelets: 187 10*3/uL (ref 150–400)
RBC: 4.55 MIL/uL (ref 4.22–5.81)
RDW: 12.8 % (ref 11.5–15.5)
WBC: 12.5 10*3/uL — ABNORMAL HIGH (ref 4.0–10.5)
nRBC: 0 % (ref 0.0–0.2)

## 2022-03-12 LAB — CBG MONITORING, ED: Glucose-Capillary: 135 mg/dL — ABNORMAL HIGH (ref 70–99)

## 2022-03-12 MED ORDER — LACTATED RINGERS IV BOLUS
1000.0000 mL | Freq: Once | INTRAVENOUS | Status: AC
  Administered 2022-03-12: 1000 mL via INTRAVENOUS

## 2022-03-12 MED ORDER — DIVALPROEX SODIUM 250 MG PO DR TAB
1000.0000 mg | DELAYED_RELEASE_TABLET | Freq: Once | ORAL | Status: AC
  Administered 2022-03-12: 1000 mg via ORAL
  Filled 2022-03-12: qty 4

## 2022-03-12 NOTE — Discharge Instructions (Signed)
If you experience more episodes of passing out especially with chest pain, shortness of breath or nausea and vomiting please return to the emergency department soon as possible as this can indicate a serious underlying disease.

## 2022-03-12 NOTE — ED Notes (Signed)
Pt became suddenly irate and demanded that his IV fluids be discontinued, stating that he wants to leave. MD notified.

## 2022-03-12 NOTE — ED Provider Notes (Signed)
[name] Provider Note   CSN: [id number] Arrival date & time: 03/12/22  2017     History  Chief Complaint  Patient presents with   Loss of Consciousness    [name] is a 57 y.o. male.  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  Patient states that he was in his usual state of health this morning went to a local church for a luncheon 8 chicken salad and drink 2 12 ounce Budweiser's and then proceeded to walk 4 miles back to his homeless shelter.  He states that he needed to use the bathroom and was straining considerably on the toilet.  Upon standing up patient states that he felt his whole body flushed and became sweaty.Pt via [facility] from homeless shelter after witnessed syncopal episode during a bowel movement. Pt was hypotensive on scene, pale, clammy, and diaphoretic. Pt nauseated and received 4mg  zofran.  On arrival to the emergency department patient states that his abdominal pain has resolved he has no acute complaints at this time.  States that he has been intermittently compliant with his seizure medication but denies any seizure-like activity over the past several days.  He states that he usually only has breakthrough seizures with heavy drinking which he states that his 2 12 ounce beers today is not considered heavy drinking to him.         Home Medications Prior to Admission medications   Medication Sig Start Date End Date Taking? Authorizing Provider  divalproex (DEPAKOTE ER) 500 MG 24 hr tablet Take 2 tablets (1,000 mg total) by mouth 3 (three) times daily. Patient not taking: Reported on 06/27/2021 10/27/18   Sinda Du, MD      Allergies    Tylenol [acetaminophen]    Review of Systems   Review of Systems  Physical Exam Updated Vital Signs BP  110/74 (BP Location: Right Arm)   Pulse 79   Temp 97.6 F (36.4 C) (Oral)   Resp 14   Ht 6\' 1"  (1.854 m)   Wt 113.4 kg   SpO2 97%   BMI 32.98 kg/m  Physical Exam Vitals and nursing note reviewed.  Constitutional:      General: He is not in acute distress.    Appearance: He is well-developed.  HENT:     Head: Normocephalic and atraumatic.  Eyes:     Conjunctiva/sclera: Conjunctivae normal.  Cardiovascular:     Rate and Rhythm: Normal rate and regular rhythm.     Heart sounds: No murmur heard. Pulmonary:     Effort: Pulmonary effort is normal. No respiratory distress.     Breath sounds: Normal breath sounds.  Abdominal:     Palpations: Abdomen is soft.     Tenderness: There is no abdominal tenderness.  Musculoskeletal:        General: No swelling.     Cervical back: Neck supple.  Skin:    General: Skin is warm and dry.     Capillary Refill: Capillary refill takes less than 2 seconds.  Neurological:     General: No focal deficit present.     Mental Status: He is alert. Mental status is at baseline.  Psychiatric:        Mood and Affect: Mood normal.     ED Results / Procedures / Treatments   Labs (  all labs ordered are listed, but only abnormal results are displayed) Labs Reviewed  BASIC METABOLIC PANEL - Abnormal; Notable for the following components:      Result Value   CO2 21 (*)    Glucose, Bld 106 (*)    Calcium 7.9 (*)    All other components within normal limits  CBC - Abnormal; Notable for the following components:   WBC 12.5 (*)    All other components within normal limits  CBG MONITORING, ED - Abnormal; Notable for the following components:   Glucose-Capillary 135 (*)    All other components within normal limits  URINALYSIS, ROUTINE W REFLEX MICROSCOPIC    EKG EKG Interpretation  Date/Time:  Monday March 12 2022 20:29:54 EST Ventricular Rate:  89 PR Interval:  154 QRS Duration: 94 QT Interval:  378 QTC Calculation: 459 R Axis:   75 Text  Interpretation: Normal sinus rhythm Normal ECG When compared with ECG of 17-Feb-2022 18:30, PREVIOUS ECG IS PRESENT Confirmed by Nanda Quinton [phone]) on 03/12/2022 8:34:01 PM  Radiology No results found.  Procedures Procedures    Medications Ordered in ED Medications  lactated ringers bolus 1,000 mL (0 mLs Intravenous Stopped 03/12/22 2228)  divalproex (DEPAKOTE) DR tablet 1,000 mg (1,000 mg Oral Given 03/12/22 2150)    ED Course/ Medical Decision Making/ A&P                             Medical Decision Making [name] is a 57 year old male past medical history as documented above presenting to the emergency department for syncopal episodes.  Patient's blood pressure on arrival 106/80, temperature 97.6 pulse 8816 respirations per minute.  On initial physical exam patient is resting comfortably in bed he has no evidence of diaphoresis or pallor.  Patient's neurological exam is nonfocal he has known facial droop and extremity weakness that is at baseline.  Initial differential for his syncopal episode includes vasovagal syncope given the prodrome as well as the fact that it occurred immediately after straining from a bowel movement upon standing.  Patient denies chest pain, shortness of breath or any such symptoms over the past several days leading up to the event making it much less likely cardiac in nature.  Patient's EKG Shows a ventricular rate of 89 patient is in a sinus rhythm patient's axis is within normal limits intervals are grossly within normal limits no evidence of ST segment elevation or depression concerning for acute ischemia.  Given his exertion today with walking 4 miles on minimal intake it is possible that he could have also had an orthostatic event.  We are therefore checking basic screening labs including a CBC and a CMP.  On prior review of patient's echocardiogram patient had an EF of 55 to 60% in May 2023 therefore giving him a liter bolus of LR.  Was given 1 home  dose of Depakote 1000 mg.  Patient received 1000 mL bolus maintain pressures blood pressure 110/74 no evidence of tachycardia.  Patient remained alert and oriented.  After receiving fluids patient was able to tolerate p.o. intake and was requesting to leave as he needed to catch a ride back to his homeless shelter.  I ambulated patient without difficulty patient had no recurrent symptoms.  Given the fact that that his presentation was most in line with vasovagal syncope I believe this is what occurred.  Patient was discharged home with cab voucher.    Amount and/or Complexity  of Data Reviewed Labs: ordered.  Risk Prescription drug management.          Final Clinical Impression(s) / ED Diagnoses Final diagnoses:  Vasovagal syncope    Rx / DC Orders ED Discharge Orders     None         Alphia Kava, MD 03/12/22 2232    Maia Plan, MD 03/12/22 2255

## 2022-03-12 NOTE — ED Triage Notes (Signed)
Pt via [facility] from homeless shelter after witnessed syncopal episode during a bowel movement. Pt was hypotensive on scene, pale, clammy, and diaphoretic. Pt nauseated and received 4mg  zofran en route with cramping in central abdomen. Hx seizures reported; Pt has been out of all prescribed medications since Friday. Received 540mL normal saline en route.   18ga left AC  BP 88/50 increased to 130/60 after 500cc NS CBG 153 O2 100% RA RR 16 HR 88 EKG unremarkable

## 2022-03-17 ENCOUNTER — Encounter (HOSPITAL_COMMUNITY): Payer: Self-pay | Admitting: Emergency Medicine

## 2022-03-17 ENCOUNTER — Emergency Department (HOSPITAL_COMMUNITY): Payer: Medicaid Other

## 2022-03-17 ENCOUNTER — Other Ambulatory Visit: Payer: Self-pay

## 2022-03-17 ENCOUNTER — Ambulatory Visit (HOSPITAL_COMMUNITY)
Admission: EM | Admit: 2022-03-17 | Discharge: 2022-03-17 | Disposition: A | Discharge status: Home / Self Care | Payer: Medicaid Other | Source: Home / Self Care

## 2022-03-17 ENCOUNTER — Emergency Department (HOSPITAL_COMMUNITY)
Admission: EM | Admit: 2022-03-17 | Discharge: 2022-03-17 | Disposition: A | Discharge status: Home / Self Care | Payer: Medicaid Other | Attending: Emergency Medicine | Admitting: Emergency Medicine

## 2022-03-17 DIAGNOSIS — M545 Low back pain, unspecified: Secondary | ICD-10-CM | POA: Diagnosis not present

## 2022-03-17 DIAGNOSIS — F430 Acute stress reaction: Secondary | ICD-10-CM | POA: Diagnosis not present

## 2022-03-17 DIAGNOSIS — J449 Chronic obstructive pulmonary disease, unspecified: Secondary | ICD-10-CM | POA: Insufficient documentation

## 2022-03-17 DIAGNOSIS — Z87820 Personal history of traumatic brain injury: Secondary | ICD-10-CM | POA: Insufficient documentation

## 2022-03-17 DIAGNOSIS — F419 Anxiety disorder, unspecified: Secondary | ICD-10-CM | POA: Insufficient documentation

## 2022-03-17 DIAGNOSIS — Z760 Encounter for issue of repeat prescription: Secondary | ICD-10-CM | POA: Insufficient documentation

## 2022-03-17 DIAGNOSIS — Z8739 Personal history of other diseases of the musculoskeletal system and connective tissue: Secondary | ICD-10-CM

## 2022-03-17 DIAGNOSIS — Z9151 Personal history of suicidal behavior: Secondary | ICD-10-CM | POA: Insufficient documentation

## 2022-03-17 DIAGNOSIS — M542 Cervicalgia: Secondary | ICD-10-CM | POA: Diagnosis not present

## 2022-03-17 DIAGNOSIS — Z5901 Sheltered homelessness: Secondary | ICD-10-CM | POA: Insufficient documentation

## 2022-03-17 DIAGNOSIS — Z8673 Personal history of transient ischemic attack (TIA), and cerebral infarction without residual deficits: Secondary | ICD-10-CM | POA: Insufficient documentation

## 2022-03-17 DIAGNOSIS — M549 Dorsalgia, unspecified: Secondary | ICD-10-CM | POA: Diagnosis present

## 2022-03-17 DIAGNOSIS — F4323 Adjustment disorder with mixed anxiety and depressed mood: Secondary | ICD-10-CM | POA: Diagnosis not present

## 2022-03-17 DIAGNOSIS — F319 Bipolar disorder, unspecified: Secondary | ICD-10-CM | POA: Insufficient documentation

## 2022-03-17 DIAGNOSIS — R569 Unspecified convulsions: Secondary | ICD-10-CM | POA: Insufficient documentation

## 2022-03-17 DIAGNOSIS — Z59819 Housing instability, housed unspecified: Secondary | ICD-10-CM

## 2022-03-17 DIAGNOSIS — G8929 Other chronic pain: Secondary | ICD-10-CM | POA: Insufficient documentation

## 2022-03-17 DIAGNOSIS — F330 Major depressive disorder, recurrent, mild: Secondary | ICD-10-CM

## 2022-03-17 DIAGNOSIS — F4389 Other reactions to severe stress: Secondary | ICD-10-CM

## 2022-03-17 DIAGNOSIS — Z5900 Homelessness unspecified: Secondary | ICD-10-CM

## 2022-03-17 LAB — COMPREHENSIVE METABOLIC PANEL
ALT: 28 U/L (ref 0–44)
AST: 35 U/L (ref 15–41)
Albumin: 3.7 g/dL (ref 3.5–5.0)
Alkaline Phosphatase: 53 U/L (ref 38–126)
Anion gap: 8 (ref 5–15)
BUN: 8 mg/dL (ref 6–20)
CO2: 27 mmol/L (ref 22–32)
Calcium: 9.3 mg/dL (ref 8.9–10.3)
Chloride: 101 mmol/L (ref 98–111)
Creatinine, Ser: 0.94 mg/dL (ref 0.61–1.24)
GFR, Estimated: >60 mL/min (ref >60)
Glucose, Bld: 111 mg/dL — ABNORMAL HIGH (ref 70–99)
Potassium: 3.8 mmol/L (ref 3.5–5.1)
Sodium: 136 mmol/L (ref 135–145)
Total Bilirubin: 0.7 mg/dL (ref 0.3–1.2)
Total Protein: 6.6 g/dL (ref 6.5–8.1)

## 2022-03-17 LAB — CBC WITH DIFFERENTIAL/PLATELET
Abs Immature Granulocytes: 0.02 10*3/uL (ref 0.00–0.07)
Basophils Absolute: 0.1 10*3/uL (ref 0.0–0.1)
Basophils Relative: 1 %
Eosinophils Absolute: 0.2 10*3/uL (ref 0.0–0.5)
Eosinophils Relative: 2 %
HCT: 43.3 % (ref 39.0–52.0)
Hemoglobin: 14.9 g/dL (ref 13.0–17.0)
Immature Granulocytes: 0 %
Lymphocytes Relative: 22 %
Lymphs Abs: 1.8 10*3/uL (ref 0.7–4.0)
MCH: 31.8 pg (ref 26.0–34.0)
MCHC: 34.4 g/dL (ref 30.0–36.0)
MCV: 92.5 fL (ref 80.0–100.0)
Monocytes Absolute: 0.6 10*3/uL (ref 0.1–1.0)
Monocytes Relative: 7 %
Neutro Abs: 5.6 10*3/uL (ref 1.7–7.7)
Neutrophils Relative %: 68 %
Platelets: 227 10*3/uL (ref 150–400)
RBC: 4.68 MIL/uL (ref 4.22–5.81)
RDW: 13 % (ref 11.5–15.5)
WBC: 8.2 10*3/uL (ref 4.0–10.5)
nRBC: 0 % (ref 0.0–0.2)

## 2022-03-17 LAB — ETHANOL: Alcohol, Ethyl (B): <10 mg/dL (ref <10)

## 2022-03-17 MED ORDER — IBUPROFEN 400 MG PO TABS
600.0000 mg | ORAL_TABLET | Freq: Three times a day (TID) | ORAL | Status: DC | PRN
  Administered 2022-03-17: 600 mg via ORAL
  Filled 2022-03-17: qty 1

## 2022-03-17 MED ORDER — DIVALPROEX SODIUM 500 MG PO DR TAB: 500.0000 mg | DELAYED_RELEASE_TABLET | Freq: Three times a day (TID) | ORAL | 0 refills | Status: DC

## 2022-03-17 MED ORDER — QUETIAPINE FUMARATE 100 MG PO TABS: 100.0000 mg | ORAL_TABLET | Freq: Every day | ORAL | 0 refills | Status: AC

## 2022-03-17 MED ORDER — METHOCARBAMOL 750 MG PO TABS: 750.0000 mg | ORAL_TABLET | Freq: Three times a day (TID) | ORAL | 0 refills | Status: AC | PRN
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
## 2022-03-17 NOTE — ED Notes (Signed)
Patient taken to lobby with no sxs of distress noted - D?C instructions given - patient verb understanding

## 2022-03-17 NOTE — Discharge Instructions (Addendum)
  Discharge recommendations:  Patient is to take medications as prescribed. Please see information for follow-up appointment with psychiatry and therapy. Please follow up [place] in clinic [place] [phone] On Monday 03/19/2022  Therapy: We recommend that patient participate in individual therapy to address mental health concerns.  Medications: The patient or guardian is to contact a medical professional and/or outpatient provider to address any new side effects that develop. The patient or guardian should update outpatient providers of any new medications and/or medication changes.   Atypical antipsychotics: If you are prescribed an atypical antipsychotic, it is recommended that your height, weight, BMI, blood pressure, fasting lipid panel, and fasting blood sugar be monitored by your outpatient providers.  Safety:  The patient should abstain from use of illicit substances/drugs and abuse of any medications. If symptoms worsen or do not continue to improve or if the patient becomes actively suicidal or homicidal then it is recommended that the patient return to the closest hospital emergency department, the [facility], or call 911 for further evaluation and treatment. National Suicide Prevention Lifeline 1-800-SUICIDE or (678) 468-6735.  About 988 988 offers 24/7 access to trained crisis counselors who can help people experiencing mental health-related distress. People can call or text 988 or chat 988lifeline.org for themselves or if they are worried about a loved one who may need crisis support.  Crisis Mobile: [facility]:                     [phone] (for crisis response 24 hours a day) [place]:                                            [phone]

## 2022-03-17 NOTE — ED Notes (Signed)
The patient also admitted to drug use. The patient stated, "For sure meth, but he was not sure about cocaine." RN informed of incident.

## 2022-03-17 NOTE — ED Notes (Signed)
Pt burst out screaming profanities, hitting his head, yelling no one wants to help him- he wants to go to rehab! He says he is at his wits end. PA came to bedside along with security.
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
## 2022-03-17 NOTE — ED Notes (Signed)
Pt refused Xray once he arrived in Xray per staff. Will notify MD. Pt demanding a CT for further eval of his neck pain.
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
## 2022-03-17 NOTE — ED Notes (Signed)
Provided pt with [place] sandwich and sprite
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
## 2022-03-17 NOTE — ED Notes (Signed)
Pt refused to dress out at this time. PA aware. Will attempt to dress pt again shortly.

## 2022-03-17 NOTE — ED Triage Notes (Signed)
[facility] [place] with reports of upper and lower back pain. Pt reports I just need help with everything. I hurt all the time.

## 2022-03-17 NOTE — Discharge Instructions (Addendum)
It was our pleasure to provide your ER care today - we hope that you feel better.  Overall, your ED workup looks good - labs are good, and imaging shows no acute injury or problem (chronic degenerative changes are noted on your imaging).  Take acetaminophen or ibuprofen as need. You may also take robaxin as need for muscle pain/spasm - no driving when taking.   Take depakote as prescribed.   Follow up closely with primary care doctor, neurology, and behavioral health provider in the next 1-2 weeks - see resources attached.   For mental health issues and/or crisis, you may also go directly to the [facility] - they are open 24/7 and walk-ins are welcome.    Return to ER if worse, new symptoms, fevers, chest pain, trouble breathing, or other emergency concern.

## 2022-03-17 NOTE — ED Notes (Signed)
When drawing lab work for the patient, this EMT asked if she could perform an EKG. The patient originally refused, but the EMT was able to have a conversation with the patient and he consented. During drawing lab work, the patient broke into a cold sweat and became shaky. The patient then decided to get into the bed due to being dizzy and shaky, but ultimately refused vitals and EKG.

## 2022-03-17 NOTE — ED Notes (Signed)
Patient Alert and oriented to baseline. Stable and ambulatory to baseline. Patient verbalized understanding of the discharge instructions.  Patient belongings were taken by the patient.   

## 2022-03-17 NOTE — ED Provider Notes (Signed)
Patient reassessed.   Pt indicates has been homelesss for long while now.  Had been living on street but friend has helped him get shelter.  He indicates has hx chronic neck and back pain, and indicates off his meds for same for several weeks.  Pt indicates he needs rx for '5 days' of 'percocet 10' and 'xanax'. Also has been out of depakote for many weeks.   Pt notes hx 'chronic stress' but denies new/acute stressor or event. He indicates he is trying to get re-established with a primary care doctor so he can get his meds.  Pt also open to establishing bh follow up.   Pt is alert and oriented. His speech and thought processes are clear.  Pt does not appear acutely depressed or despondent. No SI/HI. No acute psychosis noted - no delusions or hallucinations.   His prior ED workup largely unremarkable for acute process.   Rec acetaminophen prn for pain. Will also give small quantity rx for robaxin. Will re-start dekakote at 500 mg tid and pt to f/u pcp, neurology, and bh as outpatient.  Will also give pt [facility] as a resource.   Pt currently appears stable for ED d/c.   Return precautions provided.      Lajean Saver, MD 03/17/22 [phone]

## 2022-03-17 NOTE — ED Notes (Signed)
Pt became agitated when told he could not go to the cafeteria. States he is leaving because he is tired of waiting.

## 2022-12-24 ENCOUNTER — Other Ambulatory Visit: Payer: Self-pay

## 2022-12-24 ENCOUNTER — Encounter (HOSPITAL_COMMUNITY): Payer: Self-pay | Admitting: *Deleted

## 2022-12-24 ENCOUNTER — Emergency Department (HOSPITAL_COMMUNITY)
Admission: EM | Admit: 2022-12-24 | Discharge: 2022-12-24 | Disposition: A | Discharge status: Home / Self Care | Payer: 59 | Attending: Emergency Medicine | Admitting: Emergency Medicine

## 2022-12-24 DIAGNOSIS — I10 Essential (primary) hypertension: Secondary | ICD-10-CM | POA: Insufficient documentation

## 2022-12-24 DIAGNOSIS — J449 Chronic obstructive pulmonary disease, unspecified: Secondary | ICD-10-CM | POA: Insufficient documentation

## 2022-12-24 DIAGNOSIS — L0291 Cutaneous abscess, unspecified: Secondary | ICD-10-CM

## 2022-12-24 DIAGNOSIS — Z8673 Personal history of transient ischemic attack (TIA), and cerebral infarction without residual deficits: Secondary | ICD-10-CM | POA: Insufficient documentation

## 2022-12-24 DIAGNOSIS — L02413 Cutaneous abscess of right upper limb: Secondary | ICD-10-CM | POA: Insufficient documentation

## 2022-12-24 HISTORY — DX: Essential (primary) hypertension: I10

## 2022-12-24 HISTORY — DX: Cerebral infarction, unspecified: I63.9

## 2022-12-24 HISTORY — DX: Acute myocardial infarction, unspecified: I21.9

## 2022-12-24 HISTORY — DX: Unspecified hearing loss, right ear: H91.91

## 2022-12-24 MED ORDER — DOXYCYCLINE HYCLATE 100 MG PO CAPS: 100.0000 mg | ORAL_CAPSULE | Freq: Two times a day (BID) | ORAL | 0 refills | Status: DC

## 2022-12-24 MED ORDER — DOXYCYCLINE HYCLATE 100 MG PO TABS
100.0000 mg | ORAL_TABLET | Freq: Once | ORAL | Status: AC
  Administered 2022-12-24: 100 mg via ORAL
  Filled 2022-12-24: qty 1

## 2022-12-24 MED ORDER — LIDOCAINE HCL (PF) 1 % IJ SOLN
INTRAMUSCULAR | Status: AC
  Filled 2022-12-24: qty 5
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
## 2022-12-24 NOTE — Discharge Instructions (Addendum)
You were seen in the ER for evaluation of your abscess. Please make sure you are keeping the area clean with Dial soap and water daily. Make sure you are changing the bandage daily afterwards. I am prescribing you an antibiotic for you to take twice daily. Please complete the entirety of the course and take as prescribed. This has the potential to worsen, so please make sure you ware watching over your wound and cleaning it as well as taking your antibiotic. If you have any worsening swelling, worsening pain, worsening redness, fever, red streaking, please return to the ER. If you have any new or worsening symptoms, or any concern, please return to the ER immediately.   Contact a health care provider if: Your cyst or abscess comes back. You have any signs of infection. You notice red streaks that spread away from the incision site. You have a fever or chills. Get help right away if: You have severe pain or bleeding. You become short of breath. You have chest pain. You have signs of a severe infection. You may notice changes in your incision area, such as: Swelling that makes the skin feel hard. Numbness or tingling. Sudden increase in redness. Your skin color may change from red to purple, and then to dark spots. Blisters, ulcers, or splitting of the skin. These symptoms may be an emergency. Get help right away. Call 911. Do not wait to see if the symptoms will go away. Do not drive yourself to the hospital.

## 2022-12-24 NOTE — ED Provider Notes (Signed)
[place] EMERGENCY DEPARTMENT AT [facility] Provider Note   CSN: [id number] Arrival date & time: 12/24/22  4098     History Chief Complaint  Patient presents with   Insect Bite    [name] is a 57 y.o. male with history of seizures, hypertension, COPD, previous stroke with right-sided deficits presents emerged from today for evaluation of abscess to the left arm.  Patient thinks he was bit by something however did not see any bite his arm.  He reports that this all started on Friday.  Has had some purulent drainage from it.  Reports his tetanus was updated last year.  Denies any fever.  Reports some generalized fatigue however this is not new.  Denies any allergies to any medications.  HPI     Home Medications Prior to Admission medications   Medication Sig Start Date End Date Taking? Authorizing Provider  doxycycline (VIBRAMYCIN) 100 MG capsule Take 1 capsule (100 mg total) by mouth 2 (two) times daily. 12/24/22  Yes Achille Rich, PA-C  divalproex (DEPAKOTE) 500 MG DR tablet Take 1 tablet (500 mg total) by mouth 3 (three) times daily. 03/17/22   Cathren Laine, MD  methocarbamol (ROBAXIN) 750 MG tablet Take 1 tablet (750 mg total) by mouth 3 (three) times daily as needed (muscle spasm/pain). 03/17/22   Cathren Laine, MD  QUEtiapine (SEROQUEL) 100 MG tablet Take 1 tablet (100 mg total) by mouth at bedtime. 03/17/22 04/16/22  Bobbitt, Franchot Mimes, NP      Allergies    Tylenol [acetaminophen]    Review of Systems   Review of Systems  Constitutional:  Negative for chills and fever.  Skin:  Positive for wound.    Physical Exam Updated Vital Signs BP 106/70 (BP Location: Right Arm)   Pulse 78   Temp 97.9 F (36.6 C) (Oral)   Resp 17   Ht 6\' 1"  (1.854 m)   Wt 108.9 kg   SpO2 95%   BMI 31.66 kg/m  Physical Exam Vitals and nursing note reviewed.  Constitutional:      General: He is not in acute distress.    Appearance: He is not ill-appearing or toxic-appearing.   Eyes:     General: No scleral icterus. Cardiovascular:     Rate and Rhythm: Normal rate.  Pulmonary:     Effort: Pulmonary effort is normal. No respiratory distress.  Musculoskeletal:     Comments: Strong grip strength on the left. Full ROM of fingers. Compartments are soft. Please see pictures and skin note.   Skin:    General: Skin is warm and dry.     Capillary Refill: Capillary refill takes less than 2 seconds.     Comments: Please see image. The patient has two crusted pustules present to the more ulnar aspect of the dorsum of the forearm. Some surround erythema, warmth, and induration. No active drainage. No red streaking noted. Compartments are soft. NV intact distally. No crepitus.     Neurological:     Mental Status: He is alert. Mental status is at baseline.     Comments: Right sided deficits at baseline per patient            ED Results / Procedures / Treatments   Labs (all labs ordered are listed, but only abnormal results are displayed) Labs Reviewed - No data to display  EKG None  Radiology No results found.  Procedures .Marland KitchenIncision and Drainage  Date/Time: 12/24/2022 11:23 AM  Performed by: Achille Rich, PA-C  Authorized by: Achille Rich, PA-C   Consent:    Consent obtained:  Verbal   Consent given by:  Patient   Risks, benefits, and alternatives were discussed: yes     Risks discussed:  Incomplete drainage, pain and infection   Alternatives discussed:  No treatment Universal protocol:    Procedure explained and questions answered to patient or proxy's satisfaction: yes     Patient identity confirmed:  Verbally with patient Location:    Type:  Abscess   Size:  2.0   Location:  Upper extremity   Upper extremity location:  Arm   Arm location:  L lower arm Pre-procedure details:    Skin preparation:  Betadine Anesthesia:    Anesthesia method:  Local infiltration   Local anesthetic:  Lidocaine 1% w/o epi Procedure type:    Complexity:   Complex Procedure details:    Incision types:  Single straight   Incision depth:  Subcutaneous   Wound management:  Extensive cleaning   Drainage:  Purulent and bloody   Drainage amount:  Scant Post-procedure details:    Procedure completion:  Tolerated well, no immediate complications Comments:     The patient was offered pain medication and lidocaine for numbing before hand, but he declined. The wound was cleansed with concentric circles with betadine x3. There was a small amount of pus coming out of the more distal abscess without any manipulation. I deroofed the scabs on both of the abscesses. Upon palpation, was able to remove a small amount of purulence and blood. At this time, the patient asked for numbing medication. Lidocaine 1% without epi was used. I did make a very small incision to widen the opening of the larger, more distal pustule. Small amount of blood and pus from both of the abscesses. Nursing staff at bedside. Instructed to clean and irrigate with Xeroform, nonadherent, and coband to help with some of the swelling. Patient tolerated procedure well.     Medications Ordered in ED Medications  lidocaine (PF) (XYLOCAINE) 1 % injection (  Given 12/24/22 1117)  doxycycline (VIBRA-TABS) tablet 100 mg (100 mg Oral Given 12/24/22 1116)    ED Course/ Medical Decision Making/ A&P                               Medical Decision Making Risk Prescription drug management.   57 y.o. male presents to the ER for evaluation of abscess/cellulitis to the left forearm. Differential diagnosis includes but is not limited to cellulitis, abscess, deep space. Vital signs unremarkable. Physical exam as noted above.   On bedside US, I do see mainly cobblestoning, however the patient has been picking at it and a small amount of pus is present. Please see procedure note.  The patient's compartments are soft. He does not have any red streaking. Is is NV intact distally with good grip strength. No  limitations on moving fingers. Vital signs are stable, afebrile. Tetanus was updated last year per patient. First dose of antibiotic given here. My attending assessed at bedside and recommends antibiotics and discharge home.   We discussed the results of the labs/imaging. The plan is take antibiotic, wound care, follow up. We discussed strict return precautions and red flag symptoms. The patient verbalized their understanding and agrees to the plan. The patient is stable and being discharged home in good condition.  Portions of this report may have been transcribed using voice recognition software. Every effort was made  to ensure accuracy; however, inadvertent computerized transcription errors may be present.   I discussed this case with my attending physician who cosigned this note including patient's presenting symptoms, physical exam, and planned diagnostics and interventions. Attending physician stated agreement with plan or made changes to plan which were implemented.   Attending physician assessed patient at bedside.  Final Clinical Impression(s) / ED Diagnoses Final diagnoses:  Abscess    Rx / DC Orders ED Discharge Orders          Ordered    doxycycline (VIBRAMYCIN) 100 MG capsule  2 times daily        12/24/22 1112              Achille Rich, [place] 12/24/22 1131    Rondel Baton, MD 12/29/22 [phone]

## 2023-03-08 ENCOUNTER — Emergency Department (HOSPITAL_COMMUNITY): Payer: 59

## 2023-03-08 ENCOUNTER — Emergency Department (HOSPITAL_COMMUNITY)
Admission: EM | Admit: 2023-03-08 | Discharge: 2023-03-08 | Disposition: A | Discharge status: Home / Self Care | Payer: 59 | Attending: Emergency Medicine | Admitting: Emergency Medicine

## 2023-03-08 ENCOUNTER — Other Ambulatory Visit: Payer: Self-pay

## 2023-03-08 DIAGNOSIS — R569 Unspecified convulsions: Secondary | ICD-10-CM | POA: Diagnosis present

## 2023-03-08 DIAGNOSIS — Z79899 Other long term (current) drug therapy: Secondary | ICD-10-CM | POA: Insufficient documentation

## 2023-03-08 LAB — CBC WITH DIFFERENTIAL/PLATELET
Abs Immature Granulocytes: 0.01 10*3/uL (ref 0.00–0.07)
Basophils Absolute: 0 10*3/uL (ref 0.0–0.1)
Basophils Relative: 1 %
Eosinophils Absolute: 0.1 10*3/uL (ref 0.0–0.5)
Eosinophils Relative: 2 %
HCT: 43.6 % (ref 39.0–52.0)
Hemoglobin: 15.1 g/dL (ref 13.0–17.0)
Immature Granulocytes: 0 %
Lymphocytes Relative: 40 %
Lymphs Abs: 2.3 10*3/uL (ref 0.7–4.0)
MCH: 32.2 pg (ref 26.0–34.0)
MCHC: 34.6 g/dL (ref 30.0–36.0)
MCV: 93 fL (ref 80.0–100.0)
Monocytes Absolute: 0.4 10*3/uL (ref 0.1–1.0)
Monocytes Relative: 7 %
Neutro Abs: 2.9 10*3/uL (ref 1.7–7.7)
Neutrophils Relative %: 50 %
Platelets: 211 10*3/uL (ref 150–400)
RBC: 4.69 MIL/uL (ref 4.22–5.81)
RDW: 12.7 % (ref 11.5–15.5)
WBC: 5.8 10*3/uL (ref 4.0–10.5)
nRBC: 0 % (ref 0.0–0.2)

## 2023-03-08 LAB — COMPREHENSIVE METABOLIC PANEL
ALT: 21 U/L (ref 0–44)
AST: 21 U/L (ref 15–41)
Albumin: 4.1 g/dL (ref 3.5–5.0)
Alkaline Phosphatase: 58 U/L (ref 38–126)
Anion gap: 11 (ref 5–15)
BUN: 12 mg/dL (ref 6–20)
CO2: 22 mmol/L (ref 22–32)
Calcium: 9.3 mg/dL (ref 8.9–10.3)
Chloride: 105 mmol/L (ref 98–111)
Creatinine, Ser: 0.77 mg/dL (ref 0.61–1.24)
GFR, Estimated: >60 mL/min (ref >60)
Glucose, Bld: 102 mg/dL — ABNORMAL HIGH (ref 70–99)
Potassium: 3.6 mmol/L (ref 3.5–5.1)
Sodium: 138 mmol/L (ref 135–145)
Total Bilirubin: 0.5 mg/dL (ref 0.0–1.2)
Total Protein: 7.4 g/dL (ref 6.5–8.1)

## 2023-03-08 LAB — RAPID URINE DRUG SCREEN, HOSP PERFORMED
Amphetamines: NOT DETECTED
Barbiturates: NOT DETECTED
Benzodiazepines: NOT DETECTED
Cocaine: NOT DETECTED
Opiates: NOT DETECTED
Tetrahydrocannabinol: POSITIVE — AB

## 2023-03-08 LAB — ETHANOL: Alcohol, Ethyl (B): 93 mg/dL — ABNORMAL HIGH (ref <10)

## 2023-03-08 MED ORDER — LEVETIRACETAM IN NACL 1000 MG/100ML IV SOLN
1000.0000 mg | INTRAVENOUS | Status: AC
  Administered 2023-03-08: 1000 mg via INTRAVENOUS
  Filled 2023-03-08: qty 100

## 2023-03-08 MED ORDER — LORAZEPAM 2 MG/ML IJ SOLN
2.0000 mg | Freq: Once | INTRAMUSCULAR | Status: AC
  Filled 2023-03-08: qty 1

## 2023-03-08 MED ORDER — DIVALPROEX SODIUM 250 MG PO DR TAB
500.0000 mg | DELAYED_RELEASE_TABLET | Freq: Once | ORAL | Status: AC
  Administered 2023-03-08: 500 mg via ORAL
  Filled 2023-03-08: qty 2

## 2023-03-08 MED ORDER — LORAZEPAM 2 MG/ML IJ SOLN: 1.0000 mg | Freq: Once | INTRAMUSCULAR | Status: DC

## 2023-03-08 MED ORDER — ZIPRASIDONE MESYLATE 20 MG IM SOLR
10.0000 mg | Freq: Once | INTRAMUSCULAR | Status: AC
  Administered 2023-03-08: 10 mg via INTRAMUSCULAR
  Filled 2023-03-08: qty 20

## 2023-03-08 MED ORDER — DIVALPROEX SODIUM 500 MG PO DR TAB: 500.0000 mg | DELAYED_RELEASE_TABLET | Freq: Three times a day (TID) | ORAL | 1 refills | Status: DC

## 2023-03-08 MED ORDER — SODIUM CHLORIDE 0.9 % IV SOLN: 2000.0000 mg | Freq: Once | INTRAVENOUS | Status: DC

## 2023-03-08 MED ORDER — LORAZEPAM 2 MG/ML IJ SOLN
INTRAMUSCULAR | Status: AC
  Administered 2023-03-08: 2 mg via INTRAMUSCULAR
  Filled 2023-03-08: qty 1

## 2023-03-08 MED ORDER — STERILE WATER FOR INJECTION IJ SOLN
INTRAMUSCULAR | Status: AC
  Administered 2023-03-08: 10 mL
  Filled 2023-03-08: qty 10

## 2023-03-08 NOTE — Discharge Instructions (Signed)
Start back taking your seizure medicine

## 2023-03-08 NOTE — ED Notes (Signed)
Pt began shaking and foaming at the mouth, turning onto his side . Nurse monitored pt. EDP came to beside and talked to pt. Pt able to tell EDP all questions. Pt was able to tell nurse what medication he takes for seizures and how [place] it has been since he has taken them. As one of the nurses was attempting IV and drawing blood pt began yelling " I need a cop in her right fucking now!, I have my grandson out in the cold and they need to hurry up and take me to jail so I can get this over with." Pt began swinging and cursing, pulled his IV out and stating he did not want to be treated. Pt began fighting at 1739 refused care at 1642 and pulled off his collar and cursing vulgar talk.  EDP aware and IM shots ordered. Nurse requesting police to be at bedside for shots.

## 2023-03-08 NOTE — ED Provider Notes (Signed)
[place] EMERGENCY DEPARTMENT AT [facility] Provider Note   CSN: [id number] Arrival date & time: 03/08/23  1710     History {Add pertinent medical, surgical, social history, OB history to HPI:1} Chief Complaint  Patient presents with   Seizures    [name] is a 58 y.o. male.  Patient was at jail and hit his face on a metal object.  No loss of consciousness   Seizures      Home Medications Prior to Admission medications   Medication Sig Start Date End Date Taking? Authorizing Provider  divalproex (DEPAKOTE) 500 MG DR tablet Take 1 tablet (500 mg total) by mouth 3 (three) times daily. 03/08/23  Yes Bethann Berkshire, MD  doxycycline (VIBRAMYCIN) 100 MG capsule Take 1 capsule (100 mg total) by mouth 2 (two) times daily. 12/24/22   Achille Rich, PA-C  methocarbamol (ROBAXIN) 750 MG tablet Take 1 tablet (750 mg total) by mouth 3 (three) times daily as needed (muscle spasm/pain). 03/17/22   Cathren Laine, MD  QUEtiapine (SEROQUEL) 100 MG tablet Take 1 tablet (100 mg total) by mouth at bedtime. 03/17/22 04/16/22  Bobbitt, Franchot Mimes, NP      Allergies    Tylenol [acetaminophen]    Review of Systems   Review of Systems  Neurological:  Positive for seizures.    Physical Exam Updated Vital Signs BP 115/65   Pulse 67   Temp 98 F (36.7 C) (Axillary)   Resp 13   Ht 6\' 1"  (1.854 m)   Wt 108.9 kg   SpO2 98%   BMI 31.66 kg/m  Physical Exam  ED Results / Procedures / Treatments   Labs (all labs ordered are listed, but only abnormal results are displayed) Labs Reviewed  COMPREHENSIVE METABOLIC PANEL - Abnormal; Notable for the following components:      Result Value   Glucose, Bld 102 (*)    All other components within normal limits  RAPID URINE DRUG SCREEN, HOSP PERFORMED - Abnormal; Notable for the following components:   Tetrahydrocannabinol POSITIVE (*)    All other components within normal limits  ETHANOL - Abnormal; Notable for the following components:    Alcohol, Ethyl (B) 93 (*)    All other components within normal limits  CBC WITH DIFFERENTIAL/PLATELET    EKG None  Radiology CT Head Wo Contrast Result Date: 03/08/2023 CLINICAL DATA:  Head trauma, abnormal mental status (Age 36-64y); Ataxia, cervical trauma EXAM: CT HEAD WITHOUT CONTRAST CT CERVICAL SPINE WITHOUT CONTRAST TECHNIQUE: Multidetector CT imaging of the head and cervical spine was performed following the standard protocol without intravenous contrast. Multiplanar CT image reconstructions of the cervical spine were also generated. RADIATION DOSE REDUCTION: This exam was performed according to the departmental dose-optimization program which includes automated exposure control, adjustment of the mA and/or kV according to patient size and/or use of iterative reconstruction technique. COMPARISON:  CT head and C-spine 02/17/2022 FINDINGS: CT HEAD FINDINGS Brain: No evidence of large-territorial acute infarction. No parenchymal hemorrhage. No mass lesion. No extra-axial collection. No mass effect or midline shift. No hydrocephalus. Basilar cisterns are patent. Vascular: No hyperdense vessel. Skull: No acute fracture or focal lesion. Sinuses/Orbits: Paranasal sinuses and mastoid air cells are clear. The orbits are unremarkable. Other: None. CT CERVICAL SPINE FINDINGS Alignment: Normal. Skull base and vertebrae: Posterior disc osteophyte complex formation at the C5-C6 and C6-C7 levels. No associated severe osseous neural foraminal or central canal stenosis. No acute fracture. No aggressive appearing focal osseous lesion or focal pathologic  process. Soft tissues and spinal canal: No prevertebral fluid or swelling. No visible canal hematoma. Upper chest: Unremarkable. Other: Atherosclerotic plaque of the carotid arteries within the neck. IMPRESSION: 1. No acute intracranial abnormality. 2. No acute displaced fracture or traumatic listhesis of the cervical spine. Electronically Signed   By: Tish Frederickson M.D.   On: 03/08/2023 19:03   CT Cervical Spine Wo Contrast Result Date: 03/08/2023 CLINICAL DATA:  Head trauma, abnormal mental status (Age 76-64y); Ataxia, cervical trauma EXAM: CT HEAD WITHOUT CONTRAST CT CERVICAL SPINE WITHOUT CONTRAST TECHNIQUE: Multidetector CT imaging of the head and cervical spine was performed following the standard protocol without intravenous contrast. Multiplanar CT image reconstructions of the cervical spine were also generated. RADIATION DOSE REDUCTION: This exam was performed according to the departmental dose-optimization program which includes automated exposure control, adjustment of the mA and/or kV according to patient size and/or use of iterative reconstruction technique. COMPARISON:  CT head and C-spine 02/17/2022 FINDINGS: CT HEAD FINDINGS Brain: No evidence of large-territorial acute infarction. No parenchymal hemorrhage. No mass lesion. No extra-axial collection. No mass effect or midline shift. No hydrocephalus. Basilar cisterns are patent. Vascular: No hyperdense vessel. Skull: No acute fracture or focal lesion. Sinuses/Orbits: Paranasal sinuses and mastoid air cells are clear. The orbits are unremarkable. Other: None. CT CERVICAL SPINE FINDINGS Alignment: Normal. Skull base and vertebrae: Posterior disc osteophyte complex formation at the C5-C6 and C6-C7 levels. No associated severe osseous neural foraminal or central canal stenosis. No acute fracture. No aggressive appearing focal osseous lesion or focal pathologic process. Soft tissues and spinal canal: No prevertebral fluid or swelling. No visible canal hematoma. Upper chest: Unremarkable. Other: Atherosclerotic plaque of the carotid arteries within the neck. IMPRESSION: 1. No acute intracranial abnormality. 2. No acute displaced fracture or traumatic listhesis of the cervical spine. Electronically Signed   By: Tish Frederickson M.D.   On: 03/08/2023 19:03    Procedures Procedures  {Document cardiac  monitor, telemetry assessment procedure when appropriate:1}  Medications Ordered in ED Medications  LORazepam (ATIVAN) injection 1 mg (1 mg Intravenous Not Given 03/08/23 1756)  divalproex (DEPAKOTE) DR tablet 500 mg (has no administration in time range)  levETIRAcetam (KEPPRA) IVPB 1000 mg/100 mL premix (0 mg Intravenous Stopped 03/08/23 2029)    Followed by  levETIRAcetam (KEPPRA) IVPB 1000 mg/100 mL premix (0 mg Intravenous Stopped 03/08/23 2029)  ziprasidone (GEODON) injection 10 mg (10 mg Intramuscular Given 03/08/23 1754)  LORazepam (ATIVAN) injection 2 mg (2 mg Intramuscular Given 03/08/23 1753)  sterile water (preservative free) injection (10 mLs  Given 03/08/23 1756)    ED Course/ Medical Decision Making/ A&P  Patient had a seizure while in the emergency department.  He has a history of seizures and has not been taking his medicine {   Click here for ABCD2, HEART and other calculatorsREFRESH Note before signing :1}                              Medical Decision Making Amount and/or Complexity of Data Reviewed Labs: ordered. Radiology: ordered.  Risk Prescription drug management.   Patient with seizure.  He will be discharged home back on his Depakote  {Document critical care time when appropriate:1} {Document review of labs and clinical decision tools ie heart score, Chads2Vasc2 etc:1}  {Document your independent review of radiology images, and any outside records:1} {Document your discussion with family members, caretakers, and with consultants:1} {Document social determinants of health affecting  pt's care:1} {Document your decision making why or why not admission, treatments were needed:1} Final Clinical Impression(s) / ED Diagnoses Final diagnoses:  Seizure (HCC)    Rx / DC Orders ED Discharge Orders          Ordered    divalproex (DEPAKOTE) 500 MG DR tablet  3 times daily        03/08/23 2229

## 2023-03-08 NOTE — ED Notes (Signed)
Pt alert and talking with police staff making requests and discussing he needs mental health instead of jail.

## 2023-03-08 NOTE — ED Notes (Signed)
Pt sleeping but arousable to voice. [employer] remains at bedside.

## 2023-03-08 NOTE — ED Triage Notes (Signed)
Pt arrived [facility] with [place] [employer] stating pt was at the jail started falling forward and pt pulled his arms up to cover his face and hit his face onto a thin metal piece. Pt c/o neck pain . Pt states he takes 1500mg  Keppra but has not had it in a few days. Pt begins

## 2023-04-19 ENCOUNTER — Other Ambulatory Visit: Payer: Self-pay

## 2023-04-19 ENCOUNTER — Emergency Department (HOSPITAL_COMMUNITY): Payer: MEDICAID

## 2023-04-19 ENCOUNTER — Emergency Department (HOSPITAL_COMMUNITY)
Admission: EM | Admit: 2023-04-19 | Discharge: 2023-04-19 | Disposition: A | Discharge status: Home / Self Care | Payer: MEDICAID | Attending: Emergency Medicine | Admitting: Emergency Medicine

## 2023-04-19 ENCOUNTER — Encounter (HOSPITAL_COMMUNITY): Payer: Self-pay | Admitting: Emergency Medicine

## 2023-04-19 DIAGNOSIS — G40909 Epilepsy, unspecified, not intractable, without status epilepticus: Secondary | ICD-10-CM | POA: Diagnosis not present

## 2023-04-19 DIAGNOSIS — S7002XA Contusion of left hip, initial encounter: Secondary | ICD-10-CM | POA: Insufficient documentation

## 2023-04-19 DIAGNOSIS — S40012A Contusion of left shoulder, initial encounter: Secondary | ICD-10-CM | POA: Insufficient documentation

## 2023-04-19 DIAGNOSIS — S0083XA Contusion of other part of head, initial encounter: Secondary | ICD-10-CM | POA: Insufficient documentation

## 2023-04-19 DIAGNOSIS — W01198A Fall on same level from slipping, tripping and stumbling with subsequent striking against other object, initial encounter: Secondary | ICD-10-CM | POA: Insufficient documentation

## 2023-04-19 DIAGNOSIS — R569 Unspecified convulsions: Secondary | ICD-10-CM

## 2023-04-19 LAB — CBC WITH DIFFERENTIAL/PLATELET
Abs Immature Granulocytes: 0.01 10*3/uL (ref 0.00–0.07)
Basophils Absolute: 0.1 10*3/uL (ref 0.0–0.1)
Basophils Relative: 1 %
Eosinophils Absolute: 0.2 10*3/uL (ref 0.0–0.5)
Eosinophils Relative: 4 %
HCT: 42.6 % (ref 39.0–52.0)
Hemoglobin: 15 g/dL (ref 13.0–17.0)
Immature Granulocytes: 0 %
Lymphocytes Relative: 30 %
Lymphs Abs: 1.6 10*3/uL (ref 0.7–4.0)
MCH: 32.5 pg (ref 26.0–34.0)
MCHC: 35.2 g/dL (ref 30.0–36.0)
MCV: 92.4 fL (ref 80.0–100.0)
Monocytes Absolute: 0.4 10*3/uL (ref 0.1–1.0)
Monocytes Relative: 8 %
Neutro Abs: 3 10*3/uL (ref 1.7–7.7)
Neutrophils Relative %: 57 %
Platelets: 212 10*3/uL (ref 150–400)
RBC: 4.61 MIL/uL (ref 4.22–5.81)
RDW: 13.6 % (ref 11.5–15.5)
WBC: 5.2 10*3/uL (ref 4.0–10.5)
nRBC: 0 % (ref 0.0–0.2)

## 2023-04-19 LAB — COMPREHENSIVE METABOLIC PANEL
ALT: 16 U/L (ref 0–44)
AST: 18 U/L (ref 15–41)
Albumin: 3.8 g/dL (ref 3.5–5.0)
Alkaline Phosphatase: 54 U/L (ref 38–126)
Anion gap: 10 (ref 5–15)
BUN: 11 mg/dL (ref 6–20)
CO2: 23 mmol/L (ref 22–32)
Calcium: 9.3 mg/dL (ref 8.9–10.3)
Chloride: 101 mmol/L (ref 98–111)
Creatinine, Ser: 0.74 mg/dL (ref 0.61–1.24)
GFR, Estimated: >60 mL/min (ref >60)
Glucose, Bld: 101 mg/dL — ABNORMAL HIGH (ref 70–99)
Potassium: 3.5 mmol/L (ref 3.5–5.1)
Sodium: 134 mmol/L — ABNORMAL LOW (ref 135–145)
Total Bilirubin: 0.5 mg/dL (ref 0.0–1.2)
Total Protein: 7.3 g/dL (ref 6.5–8.1)

## 2023-04-19 LAB — URINALYSIS, ROUTINE W REFLEX MICROSCOPIC
Bilirubin Urine: NEGATIVE
Glucose, UA: NEGATIVE mg/dL
Hgb urine dipstick: NEGATIVE
Ketones, ur: NEGATIVE mg/dL
Leukocytes,Ua: NEGATIVE
Nitrite: NEGATIVE
Protein, ur: NEGATIVE mg/dL
Specific Gravity, Urine: 1.001 — ABNORMAL LOW (ref 1.005–1.030)
pH: 7 (ref 5.0–8.0)

## 2023-04-19 LAB — MAGNESIUM: Magnesium: 1.8 mg/dL (ref 1.7–2.4)

## 2023-04-19 MED ORDER — DIVALPROEX SODIUM 500 MG PO DR TAB: 500.0000 mg | DELAYED_RELEASE_TABLET | Freq: Three times a day (TID) | ORAL | 1 refills | Status: AC

## 2023-04-19 MED ORDER — OXYCODONE-ACETAMINOPHEN 10-325 MG PO TABS: 1.0000 | ORAL_TABLET | Freq: Three times a day (TID) | ORAL | 0 refills | Status: AC | PRN

## 2023-04-19 MED ORDER — OXYCODONE HCL 5 MG PO TABS
5.0000 mg | ORAL_TABLET | Freq: Once | ORAL | Status: AC
  Administered 2023-04-19: 5 mg via ORAL
  Filled 2023-04-19: qty 1

## 2023-04-19 MED ORDER — DIVALPROEX SODIUM 250 MG PO DR TAB
500.0000 mg | DELAYED_RELEASE_TABLET | Freq: Once | ORAL | Status: AC
  Administered 2023-04-19: 500 mg via ORAL
  Filled 2023-04-19: qty 2

## 2023-04-19 MED ORDER — LEVETIRACETAM IN NACL 1000 MG/100ML IV SOLN
1000.0000 mg | Freq: Once | INTRAVENOUS | Status: AC
Start: 2023-04-19 — End: 2023-04-19
  Administered 2023-04-19: 1000 mg via INTRAVENOUS
  Filled 2023-04-19: qty 100

## 2023-04-19 NOTE — Discharge Instructions (Signed)
 Please take all medications as directed.  Return to emergency department immediately for any new or worsening symptoms.

## 2023-04-19 NOTE — ED Notes (Signed)
 Seizure pads applied to stretcher by NT.

## 2023-04-19 NOTE — ED Triage Notes (Signed)
 Pt arrived via [facility] from home c/o seizure and fall, lasted 3 minutes, hit head, R side of head, laceration on his lip, pt is on a blood thinner per EMS. L shoulder pain. Hx of seizures, has not taken seizure medication x 1 week due not having a PCP.

## 2023-04-19 NOTE — ED Provider Notes (Signed)
 [name] Provider Note   CSN: [id number] Arrival date & time: 04/19/23  1914     History  Chief Complaint  Patient presents with   Seizures    [name] is a 58 y.o. male.  Patient is a 58 year old male with a past medical history of seizures who presents emergency department with a chief complaint of a seizure this morning.  Patient notes that he did fall and strike his face during the seizure.  He is also complaining of pain to his left hip and left shoulder.  Patient notes that he has been out of his Depakote for approximate the past week.  Patient currently denies any associated dizziness or lightheadedness.  He denies any abnormal headache at this time.  He denies any associated chest pain, shortness of breath, palpitations.  He has no associate abdominal pain, nausea, vomiting, diarrhea.  He denies any numbness, paresthesias or unilateral weakness.  He denies any other long bone or joint pain.   Seizures      Home Medications Prior to Admission medications   Medication Sig Start Date End Date Taking? Authorizing Provider  lisinopril (ZESTRIL) 10 MG tablet Take 10 mg by mouth daily. 03/26/23   [provider]  divalproex (DEPAKOTE) 500 MG DR tablet Take 1 tablet (500 mg total) by mouth 3 (three) times daily. 03/08/23   Bethann Berkshire, MD  doxycycline (VIBRAMYCIN) 100 MG capsule Take 1 capsule (100 mg total) by mouth 2 (two) times daily. 12/24/22   Achille Rich, PA-C  methocarbamol (ROBAXIN) 750 MG tablet Take 1 tablet (750 mg total) by mouth 3 (three) times daily as needed (muscle spasm/pain). 03/17/22   Cathren Laine, MD  QUEtiapine (SEROQUEL) 100 MG tablet Take 1 tablet (100 mg total) by mouth at bedtime. 03/17/22 04/16/22  Bobbitt, Franchot Mimes, NP      Allergies    Tylenol [acetaminophen]    Review of Systems   Review of Systems  Neurological:  Positive for seizures.  All other systems reviewed and are  negative.   Physical Exam Updated Vital Signs BP 124/82 (BP Location: Left Arm)   Pulse 71   Temp (!) 97.5 F (36.4 C) (Oral)   Resp 18   Ht 6\' 1"  (1.854 m)   Wt 108.9 kg   SpO2 97%   BMI 31.66 kg/m  Physical Exam Vitals and nursing note reviewed.  Constitutional:      Appearance: Normal appearance.  HENT:     Head: Normocephalic and atraumatic.     Nose: Nose normal.     Mouth/Throat:     Mouth: Mucous membranes are moist.  Eyes:     Extraocular Movements: Extraocular movements intact.     Conjunctiva/sclera: Conjunctivae normal.     Pupils: Pupils are equal, round, and reactive to light.  Neck:     Comments: Mild tenderness over cervical spine, no step-off or deformity Cardiovascular:     Rate and Rhythm: Normal rate and regular rhythm.     Pulses: Normal pulses.     Heart sounds: Normal heart sounds.  Pulmonary:     Effort: Pulmonary effort is normal. No respiratory distress.     Breath sounds: Normal breath sounds. No wheezing or rales.  Abdominal:     General: Abdomen is flat. Bowel sounds are normal. There is no distension.     Palpations: Abdomen is soft. There is no mass.     Tenderness: There is no abdominal tenderness. There is  no guarding.  Musculoskeletal:        General: Normal range of motion.     Cervical back: Normal range of motion and neck supple.     Comments: Tenderness palpation noted over the left shoulder diffusely, nontender palpation remainder of bilateral upper extremities, sensation intact distally, radial, ulnar, median, axillary nerve function intact about lower extremities, cap refill less than 2 seconds distally, no obvious deformity or bruising, no skin breakdown or ulceration, no lacerations or abrasions Tenderness palpation noted over the posterior aspect of the left hip, nontender palpation remainder of bilateral extremities, DP and PT pulses are 2+ distally, sensation is intact distally, full range of motion noted throughout Nontender  to palpation over thoracic and lumbar spine  Skin:    General: Skin is warm and dry.     Findings: No rash.     Comments: Superficial laceration to the inferior lip  Neurological:     General: No focal deficit present.     Mental Status: He is alert and oriented to person, place, and time. Mental status is at baseline.     Cranial Nerves: No cranial nerve deficit.     Sensory: No sensory deficit.     Motor: No weakness.     Coordination: Coordination normal.     Gait: Gait normal.     Deep Tendon Reflexes: Reflexes normal.  Psychiatric:        Mood and Affect: Mood normal.        Behavior: Behavior normal.        Thought Content: Thought content normal.        Judgment: Judgment normal.     ED Results / Procedures / Treatments   Labs (all labs ordered are listed, but only abnormal results are displayed) Labs Reviewed  URINALYSIS, ROUTINE W REFLEX MICROSCOPIC - Abnormal; Notable for the following components:      Result Value   Color, Urine COLORLESS (*)    Specific Gravity, Urine 1.001 (*)    All other components within normal limits  COMPREHENSIVE METABOLIC PANEL - Abnormal; Notable for the following components:   Sodium 134 (*)    Glucose, Bld 101 (*)    All other components within normal limits  CBC WITH DIFFERENTIAL/PLATELET  MAGNESIUM    EKG None  Radiology No results found.  Procedures Procedures    Medications Ordered in ED Medications  oxyCODONE (Oxy IR/ROXICODONE) immediate release tablet 5 mg (has no administration in time range)  divalproex (DEPAKOTE) DR tablet 500 mg (has no administration in time range)  levETIRAcetam (KEPPRA) IVPB 1000 mg/100 mL premix (has no administration in time range)    ED Course/ Medical Decision Making/ A&P                                 Medical Decision Making Amount and/or Complexity of Data Reviewed Labs: ordered. Radiology: ordered.  Risk Prescription drug management.   This patient presents to the ED for  concern of seizure, left shoulder pain, left hip pain, right-sided facial pain differential diagnosis includes seizure disorder, fracture, sprain, contusion, intracranial hemorrhage, vertebral fracture, facial fracture    Additional history obtained:  Additional history obtained from wife External records from outside source obtained and reviewed including none   Lab Tests:  I Ordered, and personally interpreted labs.  The pertinent results include: No electrolyte changes, normal creatinine, normal liver function, no anemia   Imaging Studies ordered:  I ordered imaging studies including CT scan of head, maxillofacial, C-spine, x-ray of left shoulder and left hip I independently visualized and interpreted imaging which showed no intracranial hemorrhage, no facial fracture, no vertebral fracture, no fracture of left shoulder left hip, no dislocation I agree with the radiologist interpretation   Medicines ordered and prescription drug management:  I ordered medication including oxycodone, Depakote for seizure and pain Reevaluation of the patient after these medicines showed that the patient improved I have reviewed the patients home medicines and have made adjustments as needed   Problem List / ED Course:  Patient is doing well at this time and is stable for discharge home.  Discussed with patient that CT scan of the head, cervical spine, maxillofacial region was unremarkable for any signs of acute osseous injury or intracranial hemorrhage.  Lab work has been unremarkable at this time.  Patient has returned back to his baseline and was given a dose of his Depakote and Keppra.  Patient was represcribed his Depakote to take at home as he has been out of this medication.  He was also prescribed something to help with the pain.  I did review the x-rays of the left shoulder and left hip and did not noted any obvious acute osseous injury.  Did discuss with patient that we do not have the  radiologist interpretation at this time but he is stating they would like to be discharged home without the official read.  Did discuss with patient that we may be possibly missing a further injury but he has voiced understanding to this and would like to be discharged home.  He was neurovascularly intact throughout all 4 extremities.  He had no tenderness over the thoracic or lumbar spine.  He had no tenderness over chest wall or abdomen.   Social Determinants of Health:  None           Final Clinical Impression(s) / ED Diagnoses Final diagnoses:  None    Rx / DC Orders ED Discharge Orders     None         Kathlen Mody 04/19/23 1430    Bethann Berkshire, MD 04/21/23 1135

## 2024-01-22 ENCOUNTER — Other Ambulatory Visit: Payer: Self-pay

## 2024-01-22 ENCOUNTER — Emergency Department: Payer: MEDICAID

## 2024-01-22 ENCOUNTER — Emergency Department
Admission: EM | Admit: 2024-01-22 | Discharge: 2024-01-22 | Disposition: A | Discharge status: Home / Self Care | Payer: MEDICAID | Attending: Emergency Medicine | Admitting: Emergency Medicine

## 2024-01-22 ENCOUNTER — Telehealth: Payer: Self-pay | Admitting: Medical Oncology

## 2024-01-22 ENCOUNTER — Encounter: Payer: Self-pay | Admitting: Emergency Medicine

## 2024-01-22 DIAGNOSIS — R569 Unspecified convulsions: Secondary | ICD-10-CM | POA: Insufficient documentation

## 2024-01-22 DIAGNOSIS — Z008 Encounter for other general examination: Secondary | ICD-10-CM

## 2024-01-22 LAB — CBC WITH DIFFERENTIAL/PLATELET
Abs Immature Granulocytes: 0.01 K/uL (ref 0.00–0.07)
Basophils Absolute: 0 K/uL (ref 0.0–0.1)
Basophils Relative: 1 %
Eosinophils Absolute: 0.2 K/uL (ref 0.0–0.5)
Eosinophils Relative: 4 %
HCT: 37.8 % — ABNORMAL LOW (ref 39.0–52.0)
Hemoglobin: 13 g/dL (ref 13.0–17.0)
Immature Granulocytes: 0 %
Lymphocytes Relative: 31 %
Lymphs Abs: 1.4 K/uL (ref 0.7–4.0)
MCH: 31 pg (ref 26.0–34.0)
MCHC: 34.4 g/dL (ref 30.0–36.0)
MCV: 90.2 fL (ref 80.0–100.0)
Monocytes Absolute: 0.6 K/uL (ref 0.1–1.0)
Monocytes Relative: 13 %
Neutro Abs: 2.4 K/uL (ref 1.7–7.7)
Neutrophils Relative %: 51 %
Platelets: 169 K/uL (ref 150–400)
RBC: 4.19 MIL/uL — ABNORMAL LOW (ref 4.22–5.81)
RDW: 11.9 % (ref 11.5–15.5)
WBC: 4.5 K/uL (ref 4.0–10.5)
nRBC: 0 % (ref 0.0–0.2)

## 2024-01-22 LAB — COMPREHENSIVE METABOLIC PANEL WITH GFR
ALT: 17 U/L (ref 0–44)
AST: 23 U/L (ref 15–41)
Albumin: 4.1 g/dL (ref 3.5–5.0)
Alkaline Phosphatase: 81 U/L (ref 38–126)
Anion gap: 11 (ref 5–15)
BUN: 15 mg/dL (ref 6–20)
CO2: 25 mmol/L (ref 22–32)
Calcium: 9 mg/dL (ref 8.9–10.3)
Chloride: 103 mmol/L (ref 98–111)
Creatinine, Ser: 0.82 mg/dL (ref 0.61–1.24)
GFR, Estimated: >60 mL/min (ref >60)
Glucose, Bld: 116 mg/dL — ABNORMAL HIGH (ref 70–99)
Potassium: 3.8 mmol/L (ref 3.5–5.1)
Sodium: 139 mmol/L (ref 135–145)
Total Bilirubin: 0.3 mg/dL (ref 0.0–1.2)
Total Protein: 7.2 g/dL (ref 6.5–8.1)

## 2024-01-22 LAB — CBG MONITORING, ED: Glucose-Capillary: 108 mg/dL — ABNORMAL HIGH (ref 70–99)

## 2024-01-22 MED ORDER — DIVALPROEX SODIUM 500 MG PO DR TAB
500.0000 mg | DELAYED_RELEASE_TABLET | ORAL | Status: AC
  Administered 2024-01-22: 500 mg via ORAL
  Filled 2024-01-22: qty 1

## 2024-01-22 MED ORDER — OXYCODONE-ACETAMINOPHEN 5-325 MG PO TABS
2.0000 | ORAL_TABLET | Freq: Once | ORAL | Status: AC
  Administered 2024-01-22: 2 via ORAL
  Filled 2024-01-22: qty 2

## 2024-01-22 NOTE — ED Triage Notes (Signed)
 [facility] after having 2 grand mal seizures. Per jail staff, pt was found on the floor against the wall and during seizure, his head repeatedly was hitting the concrete wall. Pt has dried blood on face with bruising noted to the left side of his check and bridge of nose. Pt reports he has not missed his medications but that the jail staff does not admin when meds are due. Pt A&O x4.

## 2024-01-22 NOTE — Telephone Encounter (Signed)
 Lab called- attempted to run a valproic  acid level but unable d/t short tube, pt discharged so unable to re-draw.

## 2024-01-22 NOTE — Discharge Instructions (Signed)
 Your medical screening exam was reassuring and you are cleared to go back to jail.  Your seizure may have occurred if you are not getting the full or regular dose of your Depakote  (valproic  acid).  According to your medical record, you should be getting Depakote  500 mg 3 times a day with food when possible.  Try to adhere to the schedule as much as possible and follow-up with your outpatient doctor when you have the opportunity.

## 2024-01-22 NOTE — ED Provider Notes (Signed)
 [facility] Provider Note    Event Date/Time   First MD Initiated Contact with Patient 01/22/24 [phone]     (approximate)   History   Seizures   HPI [name] is a 58 y.o. male who presents from jail for generalized seizure activity.  The patient says that he takes an antiepileptic medication that he cannot remember but since he has been in jail he has been getting on a different schedule and he does not think he has been administered the medication like he needs.  He said he has felt fine in general but tonight he was walked in his cell and then the next thing he remembers was a guard on top of him checking on him to see if he was okay.  He has facial bruising and his nose is bleeding.  According to the hydrographic surveyor who is with him, he was shaking all over and bashing his face into the concrete.  He is back to normal except that his face and head and back of his neck hurt.  He has no nausea or vomiting.  No chest pain or shortness of breath.     Physical Exam   Triage Vital Signs: ED Triage Vitals  Encounter Vitals Group     BP 01/22/24 0259 139/89     Girls Systolic BP Percentile --      Girls Diastolic BP Percentile --      Boys Systolic BP Percentile --      Boys Diastolic BP Percentile --      Pulse Rate 01/22/24 0259 90     Resp 01/22/24 0259 12     Temp 01/22/24 0259 98.7 F (37.1 C)     Temp Source 01/22/24 0259 Oral     SpO2 01/22/24 0259 95 %     Weight 01/22/24 0254 108.9 kg (240 lb 1.3 oz)     Height 01/22/24 0254 1.854 m (6' 1)     Head Circumference --      Peak Flow --      Pain Score 01/22/24 0254 7     Pain Loc --      Pain Education --      Exclude from Growth Chart --     Most recent vital signs: Vitals:   01/22/24 0259  BP: 139/89  Pulse: 90  Resp: 12  Temp: 98.7 F (37.1 C)  SpO2: 95%    General: Awake, no distress.  Obvious facial contusions and injuries but no active bleeding at this time.  See  photo below.  Although the patient has a lot of dried blood on his face, he mostly has a contusion and ecchymosis to his left cheek and dried epistaxis but no active bleeding and no need for laceration repair CV:  Good peripheral perfusion.  Regular rate and rhythm. Resp:  Normal effort. Speaking easily and comfortably, no accessory muscle usage nor intercostal retractions.   Abd:  No distention.  Other:  No focal neurological deficits.    ED Results / Procedures / Treatments   Labs (all labs ordered are listed, but only abnormal results are displayed) Labs Reviewed  COMPREHENSIVE METABOLIC PANEL WITH GFR - Abnormal; Notable for the following components:      Result Value   Glucose, Bld 116 (*)    All other components within normal limits  CBC WITH DIFFERENTIAL/PLATELET - Abnormal; Notable for the following components:   RBC 4.19 (*)    HCT 37.8 (*)  All other components within normal limits  CBG MONITORING, ED - Abnormal; Notable for the following components:   Glucose-Capillary 108 (*)    All other components within normal limits  URINALYSIS, ROUTINE W REFLEX MICROSCOPIC  URINE DRUG SCREEN  VALPROIC  ACID LEVEL     EKG  ED ECG REPORT I, Darleene Dome, the attending physician, personally viewed and interpreted this ECG.  Date: 01/22/2024 EKG Time: 2:59 AM Rate: 89 Rhythm: normal sinus rhythm QRS Axis: normal Intervals: normal ST/T Wave abnormalities: normal Narrative Interpretation: no evidence of acute ischemia    RADIOLOGY See ED course for details   PROCEDURES:  Critical Care performed: No  Procedures    IMPRESSION / MDM / ASSESSMENT AND PLAN / ED COURSE  I reviewed the triage vital signs and the nursing notes.                              Differential diagnosis includes, but is not limited to, subtherapeutic AED level(s) , electrolyte  or metabolic abnormality, medication or drug side effect  Patient's presentation is most consistent with acute  presentation with potential threat to life or bodily function.  Labs/studies ordered: CT head, CT maxillofacial, CT cervical spine, CMP, valproic  acid level, urine drug screen, urinalysis, CBG, CBC with differential  Interventions/Medications given:  Medications  divalproex  (DEPAKOTE ) DR tablet 500 mg (has no administration in time range)  oxyCODONE -acetaminophen  (PERCOCET/ROXICET) 5-325 MG per tablet 2 tablet (has no administration in time range)    (Note:  hospital course my include additional interventions and/or labs/studies not listed above.)   Stable vitals, no neurological deficits or additional seizure activity.  I reviewed the medical record and according to it he takes Depakote  so I am awaiting a valproic  acid level so that I can replete as needed.  I will hold off on additional antiepileptic medications at this time unless he has additional seizures.  CMP and CBC are both reassuring.  Given the head and facial trauma, I am ordering CT scans including the cervical spine.     Clinical Course as of 01/22/24 0543  Wed Jan 22, 2024  0539 I independtly viewed and interpreted the patient's head CT, maxillofacial, and cervical spine CT, and I also reviewed the radiologist's report(s).  No evidence of fracture or intracranial hemorrhage [CF]  0539 Patient has been stable for about 3 hours.  Unfortunately we were not able to get a valproic  acid level but I talked with the patient and we agreed to give him an extra dose of medicine.  I will indicate on his discharge summary for the jail that he should try to get 500 mg of Depakote  3 times a day with meals if at all possible.  He will follow-up as needed and he agrees and actually wants to go back to the jail.  The patient's medical screening exam is reassuring with no indication of an emergent medical condition requiring hospitalization or additional evaluation at this point.  The patient is safe and appropriate for discharge and outpatient  follow up.  [CF]    Clinical Course User Index [CF] Dome Darleene, MD     FINAL CLINICAL IMPRESSION(S) / ED DIAGNOSES   Final diagnoses:  Seizure [facility])  Medical clearance for incarceration     Rx / DC Orders   ED Discharge Orders     None        Note:  This document was prepared using Dragon voice recognition  software and may include unintentional dictation errors.   Gordan Huxley, MD 01/22/24 [phone]
